# Patient Record
Sex: Female | Born: 1940 | Race: White | Hispanic: No | Marital: Married | Smoking: Never smoker
Health system: Southern US, Community
[De-identification: ages and names within clinical notes are randomized; demographics above are authoritative.]

## PROBLEM LIST (undated history)

## (undated) DIAGNOSIS — I639 Cerebral infarction, unspecified: Secondary | ICD-10-CM

## (undated) DIAGNOSIS — I10 Essential (primary) hypertension: Secondary | ICD-10-CM

## (undated) DIAGNOSIS — Z9889 Other specified postprocedural states: Secondary | ICD-10-CM

## (undated) DIAGNOSIS — F0390 Unspecified dementia without behavioral disturbance: Secondary | ICD-10-CM

---

## 2016-11-13 DIAGNOSIS — D518 Other vitamin B12 deficiency anemias: Secondary | ICD-10-CM | POA: Diagnosis not present

## 2016-11-13 DIAGNOSIS — E038 Other specified hypothyroidism: Secondary | ICD-10-CM | POA: Diagnosis not present

## 2016-11-13 DIAGNOSIS — E559 Vitamin D deficiency, unspecified: Secondary | ICD-10-CM | POA: Diagnosis not present

## 2016-11-13 DIAGNOSIS — I10 Essential (primary) hypertension: Secondary | ICD-10-CM | POA: Diagnosis not present

## 2016-11-13 DIAGNOSIS — E119 Type 2 diabetes mellitus without complications: Secondary | ICD-10-CM | POA: Diagnosis not present

## 2016-11-13 DIAGNOSIS — Z79899 Other long term (current) drug therapy: Secondary | ICD-10-CM | POA: Diagnosis not present

## 2016-11-13 DIAGNOSIS — E782 Mixed hyperlipidemia: Secondary | ICD-10-CM | POA: Diagnosis not present

## 2017-01-18 DIAGNOSIS — S199XXA Unspecified injury of neck, initial encounter: Secondary | ICD-10-CM | POA: Diagnosis not present

## 2017-01-18 DIAGNOSIS — W07XXXA Fall from chair, initial encounter: Secondary | ICD-10-CM | POA: Diagnosis not present

## 2017-01-18 DIAGNOSIS — S0990XA Unspecified injury of head, initial encounter: Secondary | ICD-10-CM | POA: Diagnosis not present

## 2017-01-18 DIAGNOSIS — S0121XA Laceration without foreign body of nose, initial encounter: Secondary | ICD-10-CM | POA: Diagnosis not present

## 2017-01-18 DIAGNOSIS — S022XXA Fracture of nasal bones, initial encounter for closed fracture: Secondary | ICD-10-CM | POA: Diagnosis not present

## 2017-01-18 DIAGNOSIS — M542 Cervicalgia: Secondary | ICD-10-CM | POA: Diagnosis not present

## 2017-01-20 DIAGNOSIS — K210 Gastro-esophageal reflux disease with esophagitis: Secondary | ICD-10-CM | POA: Diagnosis not present

## 2017-01-20 DIAGNOSIS — R221 Localized swelling, mass and lump, neck: Secondary | ICD-10-CM | POA: Diagnosis not present

## 2017-01-20 DIAGNOSIS — M150 Primary generalized (osteo)arthritis: Secondary | ICD-10-CM | POA: Diagnosis not present

## 2017-01-20 DIAGNOSIS — I10 Essential (primary) hypertension: Secondary | ICD-10-CM | POA: Diagnosis not present

## 2017-01-20 DIAGNOSIS — Z1211 Encounter for screening for malignant neoplasm of colon: Secondary | ICD-10-CM | POA: Diagnosis not present

## 2017-01-20 DIAGNOSIS — E038 Other specified hypothyroidism: Secondary | ICD-10-CM | POA: Diagnosis not present

## 2017-01-21 DIAGNOSIS — M150 Primary generalized (osteo)arthritis: Secondary | ICD-10-CM | POA: Diagnosis not present

## 2017-01-21 DIAGNOSIS — E038 Other specified hypothyroidism: Secondary | ICD-10-CM | POA: Diagnosis not present

## 2017-01-21 DIAGNOSIS — E782 Mixed hyperlipidemia: Secondary | ICD-10-CM | POA: Diagnosis not present

## 2017-01-21 DIAGNOSIS — I10 Essential (primary) hypertension: Secondary | ICD-10-CM | POA: Diagnosis not present

## 2017-02-08 DIAGNOSIS — M150 Primary generalized (osteo)arthritis: Secondary | ICD-10-CM | POA: Diagnosis not present

## 2017-02-08 DIAGNOSIS — E038 Other specified hypothyroidism: Secondary | ICD-10-CM | POA: Diagnosis not present

## 2017-02-08 DIAGNOSIS — S0990XA Unspecified injury of head, initial encounter: Secondary | ICD-10-CM | POA: Diagnosis not present

## 2017-02-08 DIAGNOSIS — X58XXXA Exposure to other specified factors, initial encounter: Secondary | ICD-10-CM | POA: Diagnosis not present

## 2017-02-08 DIAGNOSIS — R0989 Other specified symptoms and signs involving the circulatory and respiratory systems: Secondary | ICD-10-CM | POA: Diagnosis not present

## 2017-02-08 DIAGNOSIS — R51 Headache: Secondary | ICD-10-CM | POA: Diagnosis not present

## 2017-02-08 DIAGNOSIS — K210 Gastro-esophageal reflux disease with esophagitis: Secondary | ICD-10-CM | POA: Diagnosis not present

## 2017-02-08 DIAGNOSIS — E782 Mixed hyperlipidemia: Secondary | ICD-10-CM | POA: Diagnosis not present

## 2017-02-08 DIAGNOSIS — I10 Essential (primary) hypertension: Secondary | ICD-10-CM | POA: Diagnosis not present

## 2017-02-11 DIAGNOSIS — E038 Other specified hypothyroidism: Secondary | ICD-10-CM | POA: Diagnosis not present

## 2017-02-11 DIAGNOSIS — E782 Mixed hyperlipidemia: Secondary | ICD-10-CM | POA: Diagnosis not present

## 2017-02-11 DIAGNOSIS — I10 Essential (primary) hypertension: Secondary | ICD-10-CM | POA: Diagnosis not present

## 2017-02-11 DIAGNOSIS — M150 Primary generalized (osteo)arthritis: Secondary | ICD-10-CM | POA: Diagnosis not present

## 2017-02-22 DIAGNOSIS — J302 Other seasonal allergic rhinitis: Secondary | ICD-10-CM | POA: Diagnosis not present

## 2017-02-22 DIAGNOSIS — E782 Mixed hyperlipidemia: Secondary | ICD-10-CM | POA: Diagnosis not present

## 2017-02-22 DIAGNOSIS — I10 Essential (primary) hypertension: Secondary | ICD-10-CM | POA: Diagnosis not present

## 2017-02-22 DIAGNOSIS — E038 Other specified hypothyroidism: Secondary | ICD-10-CM | POA: Diagnosis not present

## 2017-02-24 DIAGNOSIS — E039 Hypothyroidism, unspecified: Secondary | ICD-10-CM | POA: Diagnosis not present

## 2017-02-24 DIAGNOSIS — K922 Gastrointestinal hemorrhage, unspecified: Secondary | ICD-10-CM | POA: Diagnosis not present

## 2017-02-24 DIAGNOSIS — I6523 Occlusion and stenosis of bilateral carotid arteries: Secondary | ICD-10-CM | POA: Diagnosis not present

## 2017-02-24 DIAGNOSIS — R531 Weakness: Secondary | ICD-10-CM | POA: Diagnosis not present

## 2017-02-24 DIAGNOSIS — R42 Dizziness and giddiness: Secondary | ICD-10-CM | POA: Diagnosis not present

## 2017-02-24 DIAGNOSIS — R55 Syncope and collapse: Secondary | ICD-10-CM | POA: Diagnosis not present

## 2017-02-24 DIAGNOSIS — I639 Cerebral infarction, unspecified: Secondary | ICD-10-CM | POA: Diagnosis not present

## 2017-02-24 DIAGNOSIS — S0990XA Unspecified injury of head, initial encounter: Secondary | ICD-10-CM | POA: Diagnosis not present

## 2017-02-24 DIAGNOSIS — I10 Essential (primary) hypertension: Secondary | ICD-10-CM | POA: Diagnosis not present

## 2017-02-25 DIAGNOSIS — M1990 Unspecified osteoarthritis, unspecified site: Secondary | ICD-10-CM | POA: Diagnosis not present

## 2017-02-25 DIAGNOSIS — Z880 Allergy status to penicillin: Secondary | ICD-10-CM | POA: Diagnosis not present

## 2017-02-25 DIAGNOSIS — R69 Illness, unspecified: Secondary | ICD-10-CM | POA: Diagnosis not present

## 2017-02-25 DIAGNOSIS — I10 Essential (primary) hypertension: Secondary | ICD-10-CM | POA: Diagnosis not present

## 2017-02-25 DIAGNOSIS — K922 Gastrointestinal hemorrhage, unspecified: Secondary | ICD-10-CM | POA: Diagnosis not present

## 2017-02-25 DIAGNOSIS — Z8711 Personal history of peptic ulcer disease: Secondary | ICD-10-CM | POA: Diagnosis not present

## 2017-02-25 DIAGNOSIS — R531 Weakness: Secondary | ICD-10-CM | POA: Diagnosis not present

## 2017-02-25 DIAGNOSIS — R55 Syncope and collapse: Secondary | ICD-10-CM | POA: Diagnosis not present

## 2017-02-25 DIAGNOSIS — I361 Nonrheumatic tricuspid (valve) insufficiency: Secondary | ICD-10-CM | POA: Diagnosis not present

## 2017-02-25 DIAGNOSIS — R413 Other amnesia: Secondary | ICD-10-CM | POA: Diagnosis not present

## 2017-02-25 DIAGNOSIS — E039 Hypothyroidism, unspecified: Secondary | ICD-10-CM | POA: Diagnosis not present

## 2017-02-25 DIAGNOSIS — I6523 Occlusion and stenosis of bilateral carotid arteries: Secondary | ICD-10-CM | POA: Diagnosis not present

## 2017-02-25 DIAGNOSIS — K219 Gastro-esophageal reflux disease without esophagitis: Secondary | ICD-10-CM | POA: Diagnosis not present

## 2017-02-25 DIAGNOSIS — R42 Dizziness and giddiness: Secondary | ICD-10-CM | POA: Diagnosis not present

## 2017-02-25 DIAGNOSIS — Z882 Allergy status to sulfonamides status: Secondary | ICD-10-CM | POA: Diagnosis not present

## 2017-02-25 DIAGNOSIS — I639 Cerebral infarction, unspecified: Secondary | ICD-10-CM | POA: Diagnosis not present

## 2017-03-01 DIAGNOSIS — R195 Other fecal abnormalities: Secondary | ICD-10-CM | POA: Diagnosis not present

## 2017-03-01 DIAGNOSIS — S0990XA Unspecified injury of head, initial encounter: Secondary | ICD-10-CM | POA: Diagnosis not present

## 2017-03-01 DIAGNOSIS — S199XXA Unspecified injury of neck, initial encounter: Secondary | ICD-10-CM | POA: Diagnosis not present

## 2017-03-01 DIAGNOSIS — R55 Syncope and collapse: Secondary | ICD-10-CM | POA: Diagnosis not present

## 2017-03-01 DIAGNOSIS — R110 Nausea: Secondary | ICD-10-CM | POA: Diagnosis not present

## 2017-03-01 DIAGNOSIS — I639 Cerebral infarction, unspecified: Secondary | ICD-10-CM | POA: Diagnosis not present

## 2017-03-01 DIAGNOSIS — R531 Weakness: Secondary | ICD-10-CM | POA: Diagnosis not present

## 2017-03-01 DIAGNOSIS — Z8673 Personal history of transient ischemic attack (TIA), and cerebral infarction without residual deficits: Secondary | ICD-10-CM | POA: Diagnosis not present

## 2017-03-01 DIAGNOSIS — E876 Hypokalemia: Secondary | ICD-10-CM | POA: Diagnosis not present

## 2017-03-01 DIAGNOSIS — E039 Hypothyroidism, unspecified: Secondary | ICD-10-CM | POA: Diagnosis not present

## 2017-03-01 DIAGNOSIS — R42 Dizziness and giddiness: Secondary | ICD-10-CM | POA: Diagnosis not present

## 2017-03-01 DIAGNOSIS — R69 Illness, unspecified: Secondary | ICD-10-CM | POA: Diagnosis not present

## 2017-03-01 DIAGNOSIS — I6523 Occlusion and stenosis of bilateral carotid arteries: Secondary | ICD-10-CM | POA: Diagnosis not present

## 2017-03-01 DIAGNOSIS — D649 Anemia, unspecified: Secondary | ICD-10-CM | POA: Diagnosis not present

## 2017-03-01 DIAGNOSIS — I499 Cardiac arrhythmia, unspecified: Secondary | ICD-10-CM | POA: Diagnosis not present

## 2017-03-01 DIAGNOSIS — I10 Essential (primary) hypertension: Secondary | ICD-10-CM | POA: Diagnosis not present

## 2017-03-02 DIAGNOSIS — Z7982 Long term (current) use of aspirin: Secondary | ICD-10-CM | POA: Diagnosis not present

## 2017-03-02 DIAGNOSIS — I63233 Cerebral infarction due to unspecified occlusion or stenosis of bilateral carotid arteries: Secondary | ICD-10-CM | POA: Diagnosis not present

## 2017-03-02 DIAGNOSIS — D649 Anemia, unspecified: Secondary | ICD-10-CM | POA: Diagnosis not present

## 2017-03-02 DIAGNOSIS — E039 Hypothyroidism, unspecified: Secondary | ICD-10-CM | POA: Diagnosis not present

## 2017-03-02 DIAGNOSIS — E876 Hypokalemia: Secondary | ICD-10-CM | POA: Diagnosis not present

## 2017-03-02 DIAGNOSIS — S199XXA Unspecified injury of neck, initial encounter: Secondary | ICD-10-CM | POA: Diagnosis not present

## 2017-03-02 DIAGNOSIS — J9811 Atelectasis: Secondary | ICD-10-CM | POA: Diagnosis not present

## 2017-03-02 DIAGNOSIS — I10 Essential (primary) hypertension: Secondary | ICD-10-CM | POA: Diagnosis not present

## 2017-03-02 DIAGNOSIS — R69 Illness, unspecified: Secondary | ICD-10-CM | POA: Diagnosis not present

## 2017-03-02 DIAGNOSIS — I639 Cerebral infarction, unspecified: Secondary | ICD-10-CM | POA: Diagnosis not present

## 2017-03-02 DIAGNOSIS — R55 Syncope and collapse: Secondary | ICD-10-CM | POA: Diagnosis not present

## 2017-03-02 DIAGNOSIS — S0990XA Unspecified injury of head, initial encounter: Secondary | ICD-10-CM | POA: Diagnosis not present

## 2017-03-03 DIAGNOSIS — I10 Essential (primary) hypertension: Secondary | ICD-10-CM | POA: Diagnosis not present

## 2017-03-03 DIAGNOSIS — I639 Cerebral infarction, unspecified: Secondary | ICD-10-CM | POA: Diagnosis not present

## 2017-03-03 DIAGNOSIS — I6529 Occlusion and stenosis of unspecified carotid artery: Secondary | ICD-10-CM | POA: Diagnosis not present

## 2017-03-03 DIAGNOSIS — R55 Syncope and collapse: Secondary | ICD-10-CM | POA: Diagnosis not present

## 2017-03-03 DIAGNOSIS — I119 Hypertensive heart disease without heart failure: Secondary | ICD-10-CM | POA: Diagnosis not present

## 2017-03-03 DIAGNOSIS — R69 Illness, unspecified: Secondary | ICD-10-CM | POA: Diagnosis not present

## 2017-03-03 DIAGNOSIS — D649 Anemia, unspecified: Secondary | ICD-10-CM | POA: Diagnosis not present

## 2017-03-03 DIAGNOSIS — E039 Hypothyroidism, unspecified: Secondary | ICD-10-CM | POA: Diagnosis not present

## 2017-03-09 DIAGNOSIS — R55 Syncope and collapse: Secondary | ICD-10-CM | POA: Diagnosis not present

## 2017-03-17 DIAGNOSIS — E038 Other specified hypothyroidism: Secondary | ICD-10-CM | POA: Diagnosis not present

## 2017-03-17 DIAGNOSIS — E782 Mixed hyperlipidemia: Secondary | ICD-10-CM | POA: Diagnosis not present

## 2017-03-17 DIAGNOSIS — I10 Essential (primary) hypertension: Secondary | ICD-10-CM | POA: Diagnosis not present

## 2017-03-17 DIAGNOSIS — M150 Primary generalized (osteo)arthritis: Secondary | ICD-10-CM | POA: Diagnosis not present

## 2017-03-24 DIAGNOSIS — K210 Gastro-esophageal reflux disease with esophagitis: Secondary | ICD-10-CM | POA: Diagnosis not present

## 2017-03-24 DIAGNOSIS — E782 Mixed hyperlipidemia: Secondary | ICD-10-CM | POA: Diagnosis not present

## 2017-03-24 DIAGNOSIS — I10 Essential (primary) hypertension: Secondary | ICD-10-CM | POA: Diagnosis not present

## 2017-03-24 DIAGNOSIS — E038 Other specified hypothyroidism: Secondary | ICD-10-CM | POA: Diagnosis not present

## 2017-03-24 DIAGNOSIS — M150 Primary generalized (osteo)arthritis: Secondary | ICD-10-CM | POA: Diagnosis not present

## 2017-03-24 DIAGNOSIS — D518 Other vitamin B12 deficiency anemias: Secondary | ICD-10-CM | POA: Diagnosis not present

## 2017-03-24 DIAGNOSIS — Z79899 Other long term (current) drug therapy: Secondary | ICD-10-CM | POA: Diagnosis not present

## 2017-04-05 DIAGNOSIS — I6523 Occlusion and stenosis of bilateral carotid arteries: Secondary | ICD-10-CM | POA: Diagnosis not present

## 2017-04-07 DIAGNOSIS — R55 Syncope and collapse: Secondary | ICD-10-CM | POA: Diagnosis not present

## 2017-04-09 DIAGNOSIS — Z8673 Personal history of transient ischemic attack (TIA), and cerebral infarction without residual deficits: Secondary | ICD-10-CM | POA: Diagnosis not present

## 2017-04-09 DIAGNOSIS — I6523 Occlusion and stenosis of bilateral carotid arteries: Secondary | ICD-10-CM | POA: Diagnosis not present

## 2017-04-19 DIAGNOSIS — M150 Primary generalized (osteo)arthritis: Secondary | ICD-10-CM | POA: Diagnosis not present

## 2017-04-19 DIAGNOSIS — E782 Mixed hyperlipidemia: Secondary | ICD-10-CM | POA: Diagnosis not present

## 2017-04-19 DIAGNOSIS — E038 Other specified hypothyroidism: Secondary | ICD-10-CM | POA: Diagnosis not present

## 2017-04-19 DIAGNOSIS — I10 Essential (primary) hypertension: Secondary | ICD-10-CM | POA: Diagnosis not present

## 2017-05-03 DIAGNOSIS — K210 Gastro-esophageal reflux disease with esophagitis: Secondary | ICD-10-CM | POA: Diagnosis not present

## 2017-05-03 DIAGNOSIS — E038 Other specified hypothyroidism: Secondary | ICD-10-CM | POA: Diagnosis not present

## 2017-05-03 DIAGNOSIS — I10 Essential (primary) hypertension: Secondary | ICD-10-CM | POA: Diagnosis not present

## 2017-05-03 DIAGNOSIS — E782 Mixed hyperlipidemia: Secondary | ICD-10-CM | POA: Diagnosis not present

## 2017-05-13 DIAGNOSIS — R55 Syncope and collapse: Secondary | ICD-10-CM | POA: Diagnosis not present

## 2017-06-03 DIAGNOSIS — I10 Essential (primary) hypertension: Secondary | ICD-10-CM | POA: Diagnosis not present

## 2017-06-03 DIAGNOSIS — E782 Mixed hyperlipidemia: Secondary | ICD-10-CM | POA: Diagnosis not present

## 2017-06-03 DIAGNOSIS — R69 Illness, unspecified: Secondary | ICD-10-CM | POA: Diagnosis not present

## 2017-06-03 DIAGNOSIS — E038 Other specified hypothyroidism: Secondary | ICD-10-CM | POA: Diagnosis not present

## 2017-06-17 DIAGNOSIS — M150 Primary generalized (osteo)arthritis: Secondary | ICD-10-CM | POA: Diagnosis not present

## 2017-06-17 DIAGNOSIS — I10 Essential (primary) hypertension: Secondary | ICD-10-CM | POA: Diagnosis not present

## 2017-06-17 DIAGNOSIS — E782 Mixed hyperlipidemia: Secondary | ICD-10-CM | POA: Diagnosis not present

## 2017-06-17 DIAGNOSIS — E038 Other specified hypothyroidism: Secondary | ICD-10-CM | POA: Diagnosis not present

## 2017-07-09 DIAGNOSIS — Z7901 Long term (current) use of anticoagulants: Secondary | ICD-10-CM | POA: Diagnosis not present

## 2017-07-09 DIAGNOSIS — R4182 Altered mental status, unspecified: Secondary | ICD-10-CM | POA: Diagnosis not present

## 2017-07-09 DIAGNOSIS — R69 Illness, unspecified: Secondary | ICD-10-CM | POA: Diagnosis not present

## 2017-07-09 DIAGNOSIS — R001 Bradycardia, unspecified: Secondary | ICD-10-CM | POA: Diagnosis not present

## 2017-07-09 DIAGNOSIS — Z7902 Long term (current) use of antithrombotics/antiplatelets: Secondary | ICD-10-CM | POA: Diagnosis not present

## 2017-07-09 DIAGNOSIS — R200 Anesthesia of skin: Secondary | ICD-10-CM | POA: Diagnosis not present

## 2017-07-09 DIAGNOSIS — Z8673 Personal history of transient ischemic attack (TIA), and cerebral infarction without residual deficits: Secondary | ICD-10-CM | POA: Diagnosis not present

## 2017-07-09 DIAGNOSIS — E785 Hyperlipidemia, unspecified: Secondary | ICD-10-CM | POA: Diagnosis not present

## 2017-07-09 DIAGNOSIS — R413 Other amnesia: Secondary | ICD-10-CM | POA: Diagnosis not present

## 2017-07-09 DIAGNOSIS — R202 Paresthesia of skin: Secondary | ICD-10-CM | POA: Diagnosis not present

## 2017-07-09 DIAGNOSIS — J9811 Atelectasis: Secondary | ICD-10-CM | POA: Diagnosis not present

## 2017-07-09 DIAGNOSIS — R4020 Unspecified coma: Secondary | ICD-10-CM | POA: Diagnosis not present

## 2017-07-09 DIAGNOSIS — R410 Disorientation, unspecified: Secondary | ICD-10-CM | POA: Diagnosis not present

## 2017-07-09 DIAGNOSIS — I10 Essential (primary) hypertension: Secondary | ICD-10-CM | POA: Diagnosis not present

## 2017-07-09 DIAGNOSIS — E871 Hypo-osmolality and hyponatremia: Secondary | ICD-10-CM | POA: Diagnosis not present

## 2017-07-09 DIAGNOSIS — E039 Hypothyroidism, unspecified: Secondary | ICD-10-CM | POA: Diagnosis not present

## 2017-07-09 DIAGNOSIS — H9190 Unspecified hearing loss, unspecified ear: Secondary | ICD-10-CM | POA: Diagnosis not present

## 2017-07-10 DIAGNOSIS — I10 Essential (primary) hypertension: Secondary | ICD-10-CM | POA: Diagnosis not present

## 2017-07-10 DIAGNOSIS — M4802 Spinal stenosis, cervical region: Secondary | ICD-10-CM | POA: Diagnosis not present

## 2017-07-10 DIAGNOSIS — R200 Anesthesia of skin: Secondary | ICD-10-CM | POA: Diagnosis not present

## 2017-07-10 DIAGNOSIS — R001 Bradycardia, unspecified: Secondary | ICD-10-CM | POA: Diagnosis not present

## 2017-07-10 DIAGNOSIS — R410 Disorientation, unspecified: Secondary | ICD-10-CM | POA: Diagnosis not present

## 2017-07-10 DIAGNOSIS — R9431 Abnormal electrocardiogram [ECG] [EKG]: Secondary | ICD-10-CM | POA: Diagnosis not present

## 2017-07-10 DIAGNOSIS — Z8673 Personal history of transient ischemic attack (TIA), and cerebral infarction without residual deficits: Secondary | ICD-10-CM | POA: Diagnosis not present

## 2017-07-10 DIAGNOSIS — E039 Hypothyroidism, unspecified: Secondary | ICD-10-CM | POA: Diagnosis not present

## 2017-07-10 DIAGNOSIS — E871 Hypo-osmolality and hyponatremia: Secondary | ICD-10-CM | POA: Diagnosis not present

## 2017-07-10 DIAGNOSIS — E785 Hyperlipidemia, unspecified: Secondary | ICD-10-CM | POA: Diagnosis not present

## 2017-07-11 DIAGNOSIS — E871 Hypo-osmolality and hyponatremia: Secondary | ICD-10-CM | POA: Diagnosis not present

## 2017-07-11 DIAGNOSIS — R001 Bradycardia, unspecified: Secondary | ICD-10-CM | POA: Diagnosis not present

## 2017-07-11 DIAGNOSIS — Z8673 Personal history of transient ischemic attack (TIA), and cerebral infarction without residual deficits: Secondary | ICD-10-CM | POA: Diagnosis not present

## 2017-07-11 DIAGNOSIS — E785 Hyperlipidemia, unspecified: Secondary | ICD-10-CM | POA: Diagnosis not present

## 2017-07-11 DIAGNOSIS — R410 Disorientation, unspecified: Secondary | ICD-10-CM | POA: Diagnosis not present

## 2017-07-11 DIAGNOSIS — I10 Essential (primary) hypertension: Secondary | ICD-10-CM | POA: Diagnosis not present

## 2017-07-11 DIAGNOSIS — E039 Hypothyroidism, unspecified: Secondary | ICD-10-CM | POA: Diagnosis not present

## 2017-07-11 DIAGNOSIS — R200 Anesthesia of skin: Secondary | ICD-10-CM | POA: Diagnosis not present

## 2017-07-12 DIAGNOSIS — Z8673 Personal history of transient ischemic attack (TIA), and cerebral infarction without residual deficits: Secondary | ICD-10-CM | POA: Diagnosis not present

## 2017-07-12 DIAGNOSIS — E871 Hypo-osmolality and hyponatremia: Secondary | ICD-10-CM | POA: Diagnosis not present

## 2017-07-12 DIAGNOSIS — I358 Other nonrheumatic aortic valve disorders: Secondary | ICD-10-CM | POA: Diagnosis not present

## 2017-07-12 DIAGNOSIS — R001 Bradycardia, unspecified: Secondary | ICD-10-CM | POA: Diagnosis not present

## 2017-07-12 DIAGNOSIS — R410 Disorientation, unspecified: Secondary | ICD-10-CM | POA: Diagnosis not present

## 2017-07-12 DIAGNOSIS — E785 Hyperlipidemia, unspecified: Secondary | ICD-10-CM | POA: Diagnosis not present

## 2017-07-12 DIAGNOSIS — I10 Essential (primary) hypertension: Secondary | ICD-10-CM | POA: Diagnosis not present

## 2017-07-12 DIAGNOSIS — E039 Hypothyroidism, unspecified: Secondary | ICD-10-CM | POA: Diagnosis not present

## 2017-07-12 DIAGNOSIS — R200 Anesthesia of skin: Secondary | ICD-10-CM | POA: Diagnosis not present

## 2017-07-13 DIAGNOSIS — R200 Anesthesia of skin: Secondary | ICD-10-CM | POA: Diagnosis not present

## 2017-07-13 DIAGNOSIS — E039 Hypothyroidism, unspecified: Secondary | ICD-10-CM | POA: Diagnosis not present

## 2017-07-13 DIAGNOSIS — I10 Essential (primary) hypertension: Secondary | ICD-10-CM | POA: Diagnosis not present

## 2017-07-13 DIAGNOSIS — E871 Hypo-osmolality and hyponatremia: Secondary | ICD-10-CM | POA: Diagnosis not present

## 2017-07-13 DIAGNOSIS — I4891 Unspecified atrial fibrillation: Secondary | ICD-10-CM | POA: Diagnosis not present

## 2017-07-13 DIAGNOSIS — R410 Disorientation, unspecified: Secondary | ICD-10-CM | POA: Diagnosis not present

## 2017-07-13 DIAGNOSIS — R69 Illness, unspecified: Secondary | ICD-10-CM | POA: Diagnosis not present

## 2017-07-13 DIAGNOSIS — R002 Palpitations: Secondary | ICD-10-CM | POA: Diagnosis not present

## 2017-07-13 DIAGNOSIS — E785 Hyperlipidemia, unspecified: Secondary | ICD-10-CM | POA: Diagnosis not present

## 2017-07-13 DIAGNOSIS — R001 Bradycardia, unspecified: Secondary | ICD-10-CM | POA: Diagnosis not present

## 2017-07-14 DIAGNOSIS — E785 Hyperlipidemia, unspecified: Secondary | ICD-10-CM | POA: Diagnosis not present

## 2017-07-14 DIAGNOSIS — I498 Other specified cardiac arrhythmias: Secondary | ICD-10-CM | POA: Diagnosis not present

## 2017-07-14 DIAGNOSIS — I10 Essential (primary) hypertension: Secondary | ICD-10-CM | POA: Diagnosis not present

## 2017-07-14 DIAGNOSIS — Z8673 Personal history of transient ischemic attack (TIA), and cerebral infarction without residual deficits: Secondary | ICD-10-CM | POA: Diagnosis not present

## 2017-07-14 DIAGNOSIS — R410 Disorientation, unspecified: Secondary | ICD-10-CM | POA: Diagnosis not present

## 2017-07-14 DIAGNOSIS — E871 Hypo-osmolality and hyponatremia: Secondary | ICD-10-CM | POA: Diagnosis not present

## 2017-07-14 DIAGNOSIS — R200 Anesthesia of skin: Secondary | ICD-10-CM | POA: Diagnosis not present

## 2017-07-14 DIAGNOSIS — R69 Illness, unspecified: Secondary | ICD-10-CM | POA: Diagnosis not present

## 2017-07-14 DIAGNOSIS — R002 Palpitations: Secondary | ICD-10-CM | POA: Diagnosis not present

## 2017-07-14 DIAGNOSIS — R001 Bradycardia, unspecified: Secondary | ICD-10-CM | POA: Diagnosis not present

## 2017-07-19 DIAGNOSIS — I10 Essential (primary) hypertension: Secondary | ICD-10-CM | POA: Diagnosis not present

## 2017-07-19 DIAGNOSIS — D519 Vitamin B12 deficiency anemia, unspecified: Secondary | ICD-10-CM | POA: Diagnosis not present

## 2017-07-19 DIAGNOSIS — D518 Other vitamin B12 deficiency anemias: Secondary | ICD-10-CM | POA: Diagnosis not present

## 2017-07-19 DIAGNOSIS — K210 Gastro-esophageal reflux disease with esophagitis: Secondary | ICD-10-CM | POA: Diagnosis not present

## 2017-07-19 DIAGNOSIS — E038 Other specified hypothyroidism: Secondary | ICD-10-CM | POA: Diagnosis not present

## 2017-07-19 DIAGNOSIS — E782 Mixed hyperlipidemia: Secondary | ICD-10-CM | POA: Diagnosis not present

## 2017-07-19 DIAGNOSIS — E559 Vitamin D deficiency, unspecified: Secondary | ICD-10-CM | POA: Diagnosis not present

## 2017-07-19 DIAGNOSIS — E119 Type 2 diabetes mellitus without complications: Secondary | ICD-10-CM | POA: Diagnosis not present

## 2017-07-19 DIAGNOSIS — Z79899 Other long term (current) drug therapy: Secondary | ICD-10-CM | POA: Diagnosis not present

## 2017-07-20 DIAGNOSIS — E038 Other specified hypothyroidism: Secondary | ICD-10-CM | POA: Diagnosis not present

## 2017-07-20 DIAGNOSIS — M150 Primary generalized (osteo)arthritis: Secondary | ICD-10-CM | POA: Diagnosis not present

## 2017-07-20 DIAGNOSIS — E782 Mixed hyperlipidemia: Secondary | ICD-10-CM | POA: Diagnosis not present

## 2017-07-20 DIAGNOSIS — I10 Essential (primary) hypertension: Secondary | ICD-10-CM | POA: Diagnosis not present

## 2017-07-26 DIAGNOSIS — Z4509 Encounter for adjustment and management of other cardiac device: Secondary | ICD-10-CM | POA: Diagnosis not present

## 2017-07-26 DIAGNOSIS — R55 Syncope and collapse: Secondary | ICD-10-CM | POA: Diagnosis not present

## 2017-08-10 DIAGNOSIS — Z7901 Long term (current) use of anticoagulants: Secondary | ICD-10-CM | POA: Diagnosis not present

## 2017-08-10 DIAGNOSIS — I6529 Occlusion and stenosis of unspecified carotid artery: Secondary | ICD-10-CM | POA: Diagnosis not present

## 2017-08-10 DIAGNOSIS — I6389 Other cerebral infarction: Secondary | ICD-10-CM | POA: Diagnosis not present

## 2017-08-10 DIAGNOSIS — R413 Other amnesia: Secondary | ICD-10-CM | POA: Diagnosis not present

## 2017-08-10 DIAGNOSIS — E039 Hypothyroidism, unspecified: Secondary | ICD-10-CM | POA: Diagnosis not present

## 2017-08-10 DIAGNOSIS — Z79890 Hormone replacement therapy (postmenopausal): Secondary | ICD-10-CM | POA: Diagnosis not present

## 2017-08-10 DIAGNOSIS — I6509 Occlusion and stenosis of unspecified vertebral artery: Secondary | ICD-10-CM | POA: Diagnosis not present

## 2017-08-10 DIAGNOSIS — I10 Essential (primary) hypertension: Secondary | ICD-10-CM | POA: Diagnosis not present

## 2017-08-10 DIAGNOSIS — R55 Syncope and collapse: Secondary | ICD-10-CM | POA: Diagnosis not present

## 2017-08-11 DIAGNOSIS — R319 Hematuria, unspecified: Secondary | ICD-10-CM | POA: Diagnosis not present

## 2017-08-11 DIAGNOSIS — I10 Essential (primary) hypertension: Secondary | ICD-10-CM | POA: Diagnosis not present

## 2017-08-11 DIAGNOSIS — N22 Calculus of urinary tract in diseases classified elsewhere: Secondary | ICD-10-CM | POA: Diagnosis not present

## 2017-08-11 DIAGNOSIS — H6122 Impacted cerumen, left ear: Secondary | ICD-10-CM | POA: Diagnosis not present

## 2017-08-11 DIAGNOSIS — N399 Disorder of urinary system, unspecified: Secondary | ICD-10-CM | POA: Diagnosis not present

## 2017-08-17 DIAGNOSIS — E782 Mixed hyperlipidemia: Secondary | ICD-10-CM | POA: Diagnosis not present

## 2017-08-17 DIAGNOSIS — E038 Other specified hypothyroidism: Secondary | ICD-10-CM | POA: Diagnosis not present

## 2017-08-17 DIAGNOSIS — M150 Primary generalized (osteo)arthritis: Secondary | ICD-10-CM | POA: Diagnosis not present

## 2017-08-17 DIAGNOSIS — I10 Essential (primary) hypertension: Secondary | ICD-10-CM | POA: Diagnosis not present

## 2017-08-30 DIAGNOSIS — E038 Other specified hypothyroidism: Secondary | ICD-10-CM | POA: Diagnosis not present

## 2017-08-30 DIAGNOSIS — E782 Mixed hyperlipidemia: Secondary | ICD-10-CM | POA: Diagnosis not present

## 2017-08-30 DIAGNOSIS — I10 Essential (primary) hypertension: Secondary | ICD-10-CM | POA: Diagnosis not present

## 2017-08-30 DIAGNOSIS — K210 Gastro-esophageal reflux disease with esophagitis: Secondary | ICD-10-CM | POA: Diagnosis not present

## 2017-09-13 DIAGNOSIS — E782 Mixed hyperlipidemia: Secondary | ICD-10-CM | POA: Diagnosis not present

## 2017-09-13 DIAGNOSIS — I10 Essential (primary) hypertension: Secondary | ICD-10-CM | POA: Diagnosis not present

## 2017-09-13 DIAGNOSIS — E038 Other specified hypothyroidism: Secondary | ICD-10-CM | POA: Diagnosis not present

## 2017-09-13 DIAGNOSIS — K210 Gastro-esophageal reflux disease with esophagitis: Secondary | ICD-10-CM | POA: Diagnosis not present

## 2017-09-27 DIAGNOSIS — I10 Essential (primary) hypertension: Secondary | ICD-10-CM | POA: Diagnosis not present

## 2017-09-27 DIAGNOSIS — E038 Other specified hypothyroidism: Secondary | ICD-10-CM | POA: Diagnosis not present

## 2017-10-25 DIAGNOSIS — I639 Cerebral infarction, unspecified: Secondary | ICD-10-CM | POA: Diagnosis not present

## 2017-11-08 DIAGNOSIS — E782 Mixed hyperlipidemia: Secondary | ICD-10-CM | POA: Diagnosis not present

## 2017-11-08 DIAGNOSIS — K210 Gastro-esophageal reflux disease with esophagitis: Secondary | ICD-10-CM | POA: Diagnosis not present

## 2017-11-08 DIAGNOSIS — I10 Essential (primary) hypertension: Secondary | ICD-10-CM | POA: Diagnosis not present

## 2017-11-08 DIAGNOSIS — E119 Type 2 diabetes mellitus without complications: Secondary | ICD-10-CM | POA: Diagnosis not present

## 2017-11-08 DIAGNOSIS — R350 Frequency of micturition: Secondary | ICD-10-CM | POA: Diagnosis not present

## 2017-11-08 DIAGNOSIS — E559 Vitamin D deficiency, unspecified: Secondary | ICD-10-CM | POA: Diagnosis not present

## 2017-11-08 DIAGNOSIS — Z79899 Other long term (current) drug therapy: Secondary | ICD-10-CM | POA: Diagnosis not present

## 2017-11-08 DIAGNOSIS — E038 Other specified hypothyroidism: Secondary | ICD-10-CM | POA: Diagnosis not present

## 2017-11-08 DIAGNOSIS — D518 Other vitamin B12 deficiency anemias: Secondary | ICD-10-CM | POA: Diagnosis not present

## 2017-11-17 DIAGNOSIS — I10 Essential (primary) hypertension: Secondary | ICD-10-CM | POA: Diagnosis not present

## 2017-11-17 DIAGNOSIS — Z95818 Presence of other cardiac implants and grafts: Secondary | ICD-10-CM | POA: Diagnosis not present

## 2017-11-17 DIAGNOSIS — I63239 Cerebral infarction due to unspecified occlusion or stenosis of unspecified carotid arteries: Secondary | ICD-10-CM | POA: Diagnosis not present

## 2017-11-17 DIAGNOSIS — R55 Syncope and collapse: Secondary | ICD-10-CM | POA: Diagnosis not present

## 2017-11-24 DIAGNOSIS — M150 Primary generalized (osteo)arthritis: Secondary | ICD-10-CM | POA: Diagnosis not present

## 2017-11-24 DIAGNOSIS — E782 Mixed hyperlipidemia: Secondary | ICD-10-CM | POA: Diagnosis not present

## 2017-11-24 DIAGNOSIS — E038 Other specified hypothyroidism: Secondary | ICD-10-CM | POA: Diagnosis not present

## 2017-11-24 DIAGNOSIS — I10 Essential (primary) hypertension: Secondary | ICD-10-CM | POA: Diagnosis not present

## 2017-12-06 DIAGNOSIS — E782 Mixed hyperlipidemia: Secondary | ICD-10-CM | POA: Diagnosis not present

## 2017-12-06 DIAGNOSIS — K210 Gastro-esophageal reflux disease with esophagitis: Secondary | ICD-10-CM | POA: Diagnosis not present

## 2017-12-06 DIAGNOSIS — D518 Other vitamin B12 deficiency anemias: Secondary | ICD-10-CM | POA: Diagnosis not present

## 2017-12-06 DIAGNOSIS — I10 Essential (primary) hypertension: Secondary | ICD-10-CM | POA: Diagnosis not present

## 2017-12-06 DIAGNOSIS — E038 Other specified hypothyroidism: Secondary | ICD-10-CM | POA: Diagnosis not present

## 2017-12-20 DIAGNOSIS — E782 Mixed hyperlipidemia: Secondary | ICD-10-CM | POA: Diagnosis not present

## 2017-12-20 DIAGNOSIS — E038 Other specified hypothyroidism: Secondary | ICD-10-CM | POA: Diagnosis not present

## 2017-12-20 DIAGNOSIS — M150 Primary generalized (osteo)arthritis: Secondary | ICD-10-CM | POA: Diagnosis not present

## 2017-12-20 DIAGNOSIS — I10 Essential (primary) hypertension: Secondary | ICD-10-CM | POA: Diagnosis not present

## 2017-12-27 DIAGNOSIS — I10 Essential (primary) hypertension: Secondary | ICD-10-CM | POA: Diagnosis not present

## 2017-12-28 DIAGNOSIS — E785 Hyperlipidemia, unspecified: Secondary | ICD-10-CM | POA: Diagnosis not present

## 2017-12-28 DIAGNOSIS — I10 Essential (primary) hypertension: Secondary | ICD-10-CM | POA: Diagnosis not present

## 2017-12-28 DIAGNOSIS — Z95818 Presence of other cardiac implants and grafts: Secondary | ICD-10-CM | POA: Diagnosis not present

## 2017-12-28 DIAGNOSIS — R69 Illness, unspecified: Secondary | ICD-10-CM | POA: Diagnosis not present

## 2017-12-28 DIAGNOSIS — I639 Cerebral infarction, unspecified: Secondary | ICD-10-CM | POA: Diagnosis not present

## 2018-01-12 DIAGNOSIS — N3090 Cystitis, unspecified without hematuria: Secondary | ICD-10-CM | POA: Diagnosis not present

## 2018-01-12 DIAGNOSIS — I10 Essential (primary) hypertension: Secondary | ICD-10-CM | POA: Diagnosis not present

## 2018-01-17 DIAGNOSIS — I10 Essential (primary) hypertension: Secondary | ICD-10-CM | POA: Diagnosis not present

## 2018-01-17 DIAGNOSIS — E782 Mixed hyperlipidemia: Secondary | ICD-10-CM | POA: Diagnosis not present

## 2018-01-17 DIAGNOSIS — E038 Other specified hypothyroidism: Secondary | ICD-10-CM | POA: Diagnosis not present

## 2018-01-17 DIAGNOSIS — K210 Gastro-esophageal reflux disease with esophagitis: Secondary | ICD-10-CM | POA: Diagnosis not present

## 2018-01-17 DIAGNOSIS — D518 Other vitamin B12 deficiency anemias: Secondary | ICD-10-CM | POA: Diagnosis not present

## 2018-01-17 DIAGNOSIS — Z0000 Encounter for general adult medical examination without abnormal findings: Secondary | ICD-10-CM | POA: Diagnosis not present

## 2018-01-19 DIAGNOSIS — M150 Primary generalized (osteo)arthritis: Secondary | ICD-10-CM | POA: Diagnosis not present

## 2018-01-19 DIAGNOSIS — E782 Mixed hyperlipidemia: Secondary | ICD-10-CM | POA: Diagnosis not present

## 2018-01-19 DIAGNOSIS — E038 Other specified hypothyroidism: Secondary | ICD-10-CM | POA: Diagnosis not present

## 2018-01-19 DIAGNOSIS — I10 Essential (primary) hypertension: Secondary | ICD-10-CM | POA: Diagnosis not present

## 2018-01-24 DIAGNOSIS — I639 Cerebral infarction, unspecified: Secondary | ICD-10-CM | POA: Diagnosis not present

## 2018-01-27 DIAGNOSIS — E782 Mixed hyperlipidemia: Secondary | ICD-10-CM | POA: Diagnosis not present

## 2018-01-27 DIAGNOSIS — E119 Type 2 diabetes mellitus without complications: Secondary | ICD-10-CM | POA: Diagnosis not present

## 2018-01-27 DIAGNOSIS — D518 Other vitamin B12 deficiency anemias: Secondary | ICD-10-CM | POA: Diagnosis not present

## 2018-01-27 DIAGNOSIS — D500 Iron deficiency anemia secondary to blood loss (chronic): Secondary | ICD-10-CM | POA: Diagnosis not present

## 2018-01-27 DIAGNOSIS — E038 Other specified hypothyroidism: Secondary | ICD-10-CM | POA: Diagnosis not present

## 2018-01-27 DIAGNOSIS — E559 Vitamin D deficiency, unspecified: Secondary | ICD-10-CM | POA: Diagnosis not present

## 2018-01-27 DIAGNOSIS — R300 Dysuria: Secondary | ICD-10-CM | POA: Diagnosis not present

## 2018-01-27 DIAGNOSIS — Z79899 Other long term (current) drug therapy: Secondary | ICD-10-CM | POA: Diagnosis not present

## 2018-02-14 DIAGNOSIS — E782 Mixed hyperlipidemia: Secondary | ICD-10-CM | POA: Diagnosis not present

## 2018-02-14 DIAGNOSIS — E038 Other specified hypothyroidism: Secondary | ICD-10-CM | POA: Diagnosis not present

## 2018-02-14 DIAGNOSIS — M150 Primary generalized (osteo)arthritis: Secondary | ICD-10-CM | POA: Diagnosis not present

## 2018-02-14 DIAGNOSIS — I10 Essential (primary) hypertension: Secondary | ICD-10-CM | POA: Diagnosis not present

## 2018-02-15 DIAGNOSIS — E538 Deficiency of other specified B group vitamins: Secondary | ICD-10-CM | POA: Diagnosis not present

## 2018-02-15 DIAGNOSIS — G309 Alzheimer's disease, unspecified: Secondary | ICD-10-CM | POA: Diagnosis not present

## 2018-02-15 DIAGNOSIS — I639 Cerebral infarction, unspecified: Secondary | ICD-10-CM | POA: Diagnosis not present

## 2018-02-15 DIAGNOSIS — M159 Polyosteoarthritis, unspecified: Secondary | ICD-10-CM | POA: Diagnosis not present

## 2018-02-15 DIAGNOSIS — E785 Hyperlipidemia, unspecified: Secondary | ICD-10-CM | POA: Diagnosis not present

## 2018-02-15 DIAGNOSIS — J309 Allergic rhinitis, unspecified: Secondary | ICD-10-CM | POA: Diagnosis not present

## 2018-02-15 DIAGNOSIS — E039 Hypothyroidism, unspecified: Secondary | ICD-10-CM | POA: Diagnosis not present

## 2018-02-15 DIAGNOSIS — R5382 Chronic fatigue, unspecified: Secondary | ICD-10-CM | POA: Diagnosis not present

## 2018-02-15 DIAGNOSIS — I10 Essential (primary) hypertension: Secondary | ICD-10-CM | POA: Diagnosis not present

## 2018-02-15 DIAGNOSIS — K219 Gastro-esophageal reflux disease without esophagitis: Secondary | ICD-10-CM | POA: Diagnosis not present

## 2018-02-16 DIAGNOSIS — Z8673 Personal history of transient ischemic attack (TIA), and cerebral infarction without residual deficits: Secondary | ICD-10-CM | POA: Diagnosis not present

## 2018-02-16 DIAGNOSIS — I10 Essential (primary) hypertension: Secondary | ICD-10-CM | POA: Diagnosis not present

## 2018-02-16 DIAGNOSIS — Z95818 Presence of other cardiac implants and grafts: Secondary | ICD-10-CM | POA: Diagnosis not present

## 2018-02-16 DIAGNOSIS — R55 Syncope and collapse: Secondary | ICD-10-CM | POA: Diagnosis not present

## 2018-02-16 DIAGNOSIS — I6523 Occlusion and stenosis of bilateral carotid arteries: Secondary | ICD-10-CM | POA: Diagnosis not present

## 2018-02-16 DIAGNOSIS — R69 Illness, unspecified: Secondary | ICD-10-CM | POA: Diagnosis not present

## 2018-02-16 DIAGNOSIS — I639 Cerebral infarction, unspecified: Secondary | ICD-10-CM | POA: Diagnosis not present

## 2018-02-18 DIAGNOSIS — E039 Hypothyroidism, unspecified: Secondary | ICD-10-CM | POA: Diagnosis not present

## 2018-02-18 DIAGNOSIS — I10 Essential (primary) hypertension: Secondary | ICD-10-CM | POA: Diagnosis not present

## 2018-02-18 DIAGNOSIS — E538 Deficiency of other specified B group vitamins: Secondary | ICD-10-CM | POA: Diagnosis not present

## 2018-02-18 DIAGNOSIS — M159 Polyosteoarthritis, unspecified: Secondary | ICD-10-CM | POA: Diagnosis not present

## 2018-02-18 DIAGNOSIS — J309 Allergic rhinitis, unspecified: Secondary | ICD-10-CM | POA: Diagnosis not present

## 2018-02-18 DIAGNOSIS — R69 Illness, unspecified: Secondary | ICD-10-CM | POA: Diagnosis not present

## 2018-02-18 DIAGNOSIS — I639 Cerebral infarction, unspecified: Secondary | ICD-10-CM | POA: Diagnosis not present

## 2018-02-18 DIAGNOSIS — E785 Hyperlipidemia, unspecified: Secondary | ICD-10-CM | POA: Diagnosis not present

## 2018-02-18 DIAGNOSIS — K219 Gastro-esophageal reflux disease without esophagitis: Secondary | ICD-10-CM | POA: Diagnosis not present

## 2018-02-18 DIAGNOSIS — G309 Alzheimer's disease, unspecified: Secondary | ICD-10-CM | POA: Diagnosis not present

## 2018-02-21 DIAGNOSIS — L570 Actinic keratosis: Secondary | ICD-10-CM | POA: Diagnosis not present

## 2018-02-21 DIAGNOSIS — L578 Other skin changes due to chronic exposure to nonionizing radiation: Secondary | ICD-10-CM | POA: Diagnosis not present

## 2018-02-22 DIAGNOSIS — E785 Hyperlipidemia, unspecified: Secondary | ICD-10-CM | POA: Diagnosis not present

## 2018-02-22 DIAGNOSIS — E538 Deficiency of other specified B group vitamins: Secondary | ICD-10-CM | POA: Diagnosis not present

## 2018-02-22 DIAGNOSIS — Z9181 History of falling: Secondary | ICD-10-CM | POA: Diagnosis not present

## 2018-02-22 DIAGNOSIS — I10 Essential (primary) hypertension: Secondary | ICD-10-CM | POA: Diagnosis not present

## 2018-02-22 DIAGNOSIS — G309 Alzheimer's disease, unspecified: Secondary | ICD-10-CM | POA: Diagnosis not present

## 2018-02-22 DIAGNOSIS — Z1331 Encounter for screening for depression: Secondary | ICD-10-CM | POA: Diagnosis not present

## 2018-02-22 DIAGNOSIS — R69 Illness, unspecified: Secondary | ICD-10-CM | POA: Diagnosis not present

## 2018-02-22 DIAGNOSIS — K219 Gastro-esophageal reflux disease without esophagitis: Secondary | ICD-10-CM | POA: Diagnosis not present

## 2018-02-22 DIAGNOSIS — J309 Allergic rhinitis, unspecified: Secondary | ICD-10-CM | POA: Diagnosis not present

## 2018-02-25 DIAGNOSIS — R69 Illness, unspecified: Secondary | ICD-10-CM | POA: Diagnosis not present

## 2018-02-25 DIAGNOSIS — E538 Deficiency of other specified B group vitamins: Secondary | ICD-10-CM | POA: Diagnosis not present

## 2018-02-25 DIAGNOSIS — K219 Gastro-esophageal reflux disease without esophagitis: Secondary | ICD-10-CM | POA: Diagnosis not present

## 2018-02-25 DIAGNOSIS — K921 Melena: Secondary | ICD-10-CM | POA: Diagnosis not present

## 2018-02-25 DIAGNOSIS — R300 Dysuria: Secondary | ICD-10-CM | POA: Diagnosis not present

## 2018-02-25 DIAGNOSIS — J309 Allergic rhinitis, unspecified: Secondary | ICD-10-CM | POA: Diagnosis not present

## 2018-02-25 DIAGNOSIS — E785 Hyperlipidemia, unspecified: Secondary | ICD-10-CM | POA: Diagnosis not present

## 2018-02-25 DIAGNOSIS — I10 Essential (primary) hypertension: Secondary | ICD-10-CM | POA: Diagnosis not present

## 2018-02-25 DIAGNOSIS — G309 Alzheimer's disease, unspecified: Secondary | ICD-10-CM | POA: Diagnosis not present

## 2018-03-01 DIAGNOSIS — M159 Polyosteoarthritis, unspecified: Secondary | ICD-10-CM | POA: Diagnosis not present

## 2018-03-01 DIAGNOSIS — I639 Cerebral infarction, unspecified: Secondary | ICD-10-CM | POA: Diagnosis not present

## 2018-03-01 DIAGNOSIS — E538 Deficiency of other specified B group vitamins: Secondary | ICD-10-CM | POA: Diagnosis not present

## 2018-03-01 DIAGNOSIS — R69 Illness, unspecified: Secondary | ICD-10-CM | POA: Diagnosis not present

## 2018-03-01 DIAGNOSIS — K219 Gastro-esophageal reflux disease without esophagitis: Secondary | ICD-10-CM | POA: Diagnosis not present

## 2018-03-01 DIAGNOSIS — J309 Allergic rhinitis, unspecified: Secondary | ICD-10-CM | POA: Diagnosis not present

## 2018-03-01 DIAGNOSIS — E039 Hypothyroidism, unspecified: Secondary | ICD-10-CM | POA: Diagnosis not present

## 2018-03-01 DIAGNOSIS — I10 Essential (primary) hypertension: Secondary | ICD-10-CM | POA: Diagnosis not present

## 2018-03-01 DIAGNOSIS — G309 Alzheimer's disease, unspecified: Secondary | ICD-10-CM | POA: Diagnosis not present

## 2018-03-01 DIAGNOSIS — E785 Hyperlipidemia, unspecified: Secondary | ICD-10-CM | POA: Diagnosis not present

## 2018-03-15 DIAGNOSIS — K219 Gastro-esophageal reflux disease without esophagitis: Secondary | ICD-10-CM | POA: Diagnosis not present

## 2018-03-15 DIAGNOSIS — E785 Hyperlipidemia, unspecified: Secondary | ICD-10-CM | POA: Diagnosis not present

## 2018-03-15 DIAGNOSIS — G309 Alzheimer's disease, unspecified: Secondary | ICD-10-CM | POA: Diagnosis not present

## 2018-03-15 DIAGNOSIS — R69 Illness, unspecified: Secondary | ICD-10-CM | POA: Diagnosis not present

## 2018-03-15 DIAGNOSIS — R300 Dysuria: Secondary | ICD-10-CM | POA: Diagnosis not present

## 2018-03-15 DIAGNOSIS — M159 Polyosteoarthritis, unspecified: Secondary | ICD-10-CM | POA: Diagnosis not present

## 2018-03-15 DIAGNOSIS — E039 Hypothyroidism, unspecified: Secondary | ICD-10-CM | POA: Diagnosis not present

## 2018-03-15 DIAGNOSIS — E538 Deficiency of other specified B group vitamins: Secondary | ICD-10-CM | POA: Diagnosis not present

## 2018-03-15 DIAGNOSIS — J309 Allergic rhinitis, unspecified: Secondary | ICD-10-CM | POA: Diagnosis not present

## 2018-03-15 DIAGNOSIS — I10 Essential (primary) hypertension: Secondary | ICD-10-CM | POA: Diagnosis not present

## 2018-03-23 DIAGNOSIS — T50905A Adverse effect of unspecified drugs, medicaments and biological substances, initial encounter: Secondary | ICD-10-CM | POA: Diagnosis not present

## 2018-03-23 DIAGNOSIS — Z8673 Personal history of transient ischemic attack (TIA), and cerebral infarction without residual deficits: Secondary | ICD-10-CM | POA: Diagnosis not present

## 2018-03-23 DIAGNOSIS — E039 Hypothyroidism, unspecified: Secondary | ICD-10-CM | POA: Diagnosis not present

## 2018-03-23 DIAGNOSIS — X58XXXA Exposure to other specified factors, initial encounter: Secondary | ICD-10-CM | POA: Diagnosis not present

## 2018-03-23 DIAGNOSIS — E785 Hyperlipidemia, unspecified: Secondary | ICD-10-CM | POA: Diagnosis not present

## 2018-03-23 DIAGNOSIS — K449 Diaphragmatic hernia without obstruction or gangrene: Secondary | ICD-10-CM | POA: Diagnosis not present

## 2018-03-23 DIAGNOSIS — I10 Essential (primary) hypertension: Secondary | ICD-10-CM | POA: Diagnosis not present

## 2018-03-23 DIAGNOSIS — R531 Weakness: Secondary | ICD-10-CM | POA: Diagnosis not present

## 2018-03-23 DIAGNOSIS — R69 Illness, unspecified: Secondary | ICD-10-CM | POA: Diagnosis not present

## 2018-03-25 DIAGNOSIS — I639 Cerebral infarction, unspecified: Secondary | ICD-10-CM | POA: Diagnosis not present

## 2018-03-25 DIAGNOSIS — E785 Hyperlipidemia, unspecified: Secondary | ICD-10-CM | POA: Diagnosis not present

## 2018-03-25 DIAGNOSIS — K219 Gastro-esophageal reflux disease without esophagitis: Secondary | ICD-10-CM | POA: Diagnosis not present

## 2018-03-25 DIAGNOSIS — M159 Polyosteoarthritis, unspecified: Secondary | ICD-10-CM | POA: Diagnosis not present

## 2018-03-25 DIAGNOSIS — R69 Illness, unspecified: Secondary | ICD-10-CM | POA: Diagnosis not present

## 2018-03-25 DIAGNOSIS — G309 Alzheimer's disease, unspecified: Secondary | ICD-10-CM | POA: Diagnosis not present

## 2018-03-25 DIAGNOSIS — I10 Essential (primary) hypertension: Secondary | ICD-10-CM | POA: Diagnosis not present

## 2018-03-25 DIAGNOSIS — E039 Hypothyroidism, unspecified: Secondary | ICD-10-CM | POA: Diagnosis not present

## 2018-03-25 DIAGNOSIS — E538 Deficiency of other specified B group vitamins: Secondary | ICD-10-CM | POA: Diagnosis not present

## 2018-04-13 DIAGNOSIS — H2513 Age-related nuclear cataract, bilateral: Secondary | ICD-10-CM | POA: Diagnosis not present

## 2018-04-22 DIAGNOSIS — G309 Alzheimer's disease, unspecified: Secondary | ICD-10-CM | POA: Diagnosis not present

## 2018-04-22 DIAGNOSIS — I639 Cerebral infarction, unspecified: Secondary | ICD-10-CM | POA: Diagnosis not present

## 2018-04-22 DIAGNOSIS — I10 Essential (primary) hypertension: Secondary | ICD-10-CM | POA: Diagnosis not present

## 2018-04-22 DIAGNOSIS — R69 Illness, unspecified: Secondary | ICD-10-CM | POA: Diagnosis not present

## 2018-04-22 DIAGNOSIS — E538 Deficiency of other specified B group vitamins: Secondary | ICD-10-CM | POA: Diagnosis not present

## 2018-04-22 DIAGNOSIS — J309 Allergic rhinitis, unspecified: Secondary | ICD-10-CM | POA: Diagnosis not present

## 2018-04-22 DIAGNOSIS — K219 Gastro-esophageal reflux disease without esophagitis: Secondary | ICD-10-CM | POA: Diagnosis not present

## 2018-04-22 DIAGNOSIS — E785 Hyperlipidemia, unspecified: Secondary | ICD-10-CM | POA: Diagnosis not present

## 2018-04-22 DIAGNOSIS — E039 Hypothyroidism, unspecified: Secondary | ICD-10-CM | POA: Diagnosis not present

## 2018-04-22 DIAGNOSIS — M159 Polyosteoarthritis, unspecified: Secondary | ICD-10-CM | POA: Diagnosis not present

## 2018-04-23 DIAGNOSIS — Z95818 Presence of other cardiac implants and grafts: Secondary | ICD-10-CM | POA: Diagnosis not present

## 2018-04-23 DIAGNOSIS — I639 Cerebral infarction, unspecified: Secondary | ICD-10-CM | POA: Diagnosis not present

## 2018-05-02 DIAGNOSIS — H5789 Other specified disorders of eye and adnexa: Secondary | ICD-10-CM | POA: Diagnosis not present

## 2018-05-02 DIAGNOSIS — H2511 Age-related nuclear cataract, right eye: Secondary | ICD-10-CM | POA: Diagnosis not present

## 2018-05-20 DIAGNOSIS — G309 Alzheimer's disease, unspecified: Secondary | ICD-10-CM | POA: Diagnosis not present

## 2018-05-20 DIAGNOSIS — E785 Hyperlipidemia, unspecified: Secondary | ICD-10-CM | POA: Diagnosis not present

## 2018-05-20 DIAGNOSIS — I639 Cerebral infarction, unspecified: Secondary | ICD-10-CM | POA: Diagnosis not present

## 2018-05-20 DIAGNOSIS — E538 Deficiency of other specified B group vitamins: Secondary | ICD-10-CM | POA: Diagnosis not present

## 2018-05-20 DIAGNOSIS — I10 Essential (primary) hypertension: Secondary | ICD-10-CM | POA: Diagnosis not present

## 2018-05-20 DIAGNOSIS — M159 Polyosteoarthritis, unspecified: Secondary | ICD-10-CM | POA: Diagnosis not present

## 2018-05-20 DIAGNOSIS — R69 Illness, unspecified: Secondary | ICD-10-CM | POA: Diagnosis not present

## 2018-05-20 DIAGNOSIS — J309 Allergic rhinitis, unspecified: Secondary | ICD-10-CM | POA: Diagnosis not present

## 2018-05-20 DIAGNOSIS — E539 Vitamin B deficiency, unspecified: Secondary | ICD-10-CM | POA: Diagnosis not present

## 2018-05-30 DIAGNOSIS — H2512 Age-related nuclear cataract, left eye: Secondary | ICD-10-CM | POA: Diagnosis not present

## 2018-05-30 DIAGNOSIS — H25812 Combined forms of age-related cataract, left eye: Secondary | ICD-10-CM | POA: Diagnosis not present

## 2018-05-31 DIAGNOSIS — H2512 Age-related nuclear cataract, left eye: Secondary | ICD-10-CM | POA: Diagnosis not present

## 2018-06-21 DIAGNOSIS — E785 Hyperlipidemia, unspecified: Secondary | ICD-10-CM | POA: Diagnosis not present

## 2018-06-21 DIAGNOSIS — I639 Cerebral infarction, unspecified: Secondary | ICD-10-CM | POA: Diagnosis not present

## 2018-06-21 DIAGNOSIS — M159 Polyosteoarthritis, unspecified: Secondary | ICD-10-CM | POA: Diagnosis not present

## 2018-06-21 DIAGNOSIS — J309 Allergic rhinitis, unspecified: Secondary | ICD-10-CM | POA: Diagnosis not present

## 2018-06-21 DIAGNOSIS — E538 Deficiency of other specified B group vitamins: Secondary | ICD-10-CM | POA: Diagnosis not present

## 2018-06-21 DIAGNOSIS — R69 Illness, unspecified: Secondary | ICD-10-CM | POA: Diagnosis not present

## 2018-06-21 DIAGNOSIS — G309 Alzheimer's disease, unspecified: Secondary | ICD-10-CM | POA: Diagnosis not present

## 2018-06-21 DIAGNOSIS — K219 Gastro-esophageal reflux disease without esophagitis: Secondary | ICD-10-CM | POA: Diagnosis not present

## 2018-06-21 DIAGNOSIS — R229 Localized swelling, mass and lump, unspecified: Secondary | ICD-10-CM | POA: Diagnosis not present

## 2018-06-23 DIAGNOSIS — L820 Inflamed seborrheic keratosis: Secondary | ICD-10-CM | POA: Diagnosis not present

## 2018-06-23 DIAGNOSIS — L918 Other hypertrophic disorders of the skin: Secondary | ICD-10-CM | POA: Diagnosis not present

## 2018-07-20 DIAGNOSIS — E785 Hyperlipidemia, unspecified: Secondary | ICD-10-CM | POA: Diagnosis not present

## 2018-07-20 DIAGNOSIS — Z95818 Presence of other cardiac implants and grafts: Secondary | ICD-10-CM | POA: Diagnosis not present

## 2018-07-20 DIAGNOSIS — I639 Cerebral infarction, unspecified: Secondary | ICD-10-CM | POA: Diagnosis not present

## 2018-07-20 DIAGNOSIS — Z8673 Personal history of transient ischemic attack (TIA), and cerebral infarction without residual deficits: Secondary | ICD-10-CM | POA: Diagnosis not present

## 2018-07-20 DIAGNOSIS — I6523 Occlusion and stenosis of bilateral carotid arteries: Secondary | ICD-10-CM | POA: Diagnosis not present

## 2018-07-20 DIAGNOSIS — R69 Illness, unspecified: Secondary | ICD-10-CM | POA: Diagnosis not present

## 2018-07-20 DIAGNOSIS — I10 Essential (primary) hypertension: Secondary | ICD-10-CM | POA: Diagnosis not present

## 2018-07-21 DIAGNOSIS — Z9889 Other specified postprocedural states: Secondary | ICD-10-CM | POA: Diagnosis not present

## 2018-07-22 DIAGNOSIS — E785 Hyperlipidemia, unspecified: Secondary | ICD-10-CM | POA: Diagnosis not present

## 2018-07-22 DIAGNOSIS — M159 Polyosteoarthritis, unspecified: Secondary | ICD-10-CM | POA: Diagnosis not present

## 2018-07-22 DIAGNOSIS — E039 Hypothyroidism, unspecified: Secondary | ICD-10-CM | POA: Diagnosis not present

## 2018-07-22 DIAGNOSIS — J309 Allergic rhinitis, unspecified: Secondary | ICD-10-CM | POA: Diagnosis not present

## 2018-07-22 DIAGNOSIS — G309 Alzheimer's disease, unspecified: Secondary | ICD-10-CM | POA: Diagnosis not present

## 2018-07-22 DIAGNOSIS — R69 Illness, unspecified: Secondary | ICD-10-CM | POA: Diagnosis not present

## 2018-07-22 DIAGNOSIS — K219 Gastro-esophageal reflux disease without esophagitis: Secondary | ICD-10-CM | POA: Diagnosis not present

## 2018-07-22 DIAGNOSIS — I639 Cerebral infarction, unspecified: Secondary | ICD-10-CM | POA: Diagnosis not present

## 2018-07-22 DIAGNOSIS — E538 Deficiency of other specified B group vitamins: Secondary | ICD-10-CM | POA: Diagnosis not present

## 2018-08-22 DIAGNOSIS — K219 Gastro-esophageal reflux disease without esophagitis: Secondary | ICD-10-CM | POA: Diagnosis not present

## 2018-08-22 DIAGNOSIS — M159 Polyosteoarthritis, unspecified: Secondary | ICD-10-CM | POA: Diagnosis not present

## 2018-08-22 DIAGNOSIS — E538 Deficiency of other specified B group vitamins: Secondary | ICD-10-CM | POA: Diagnosis not present

## 2018-08-22 DIAGNOSIS — E785 Hyperlipidemia, unspecified: Secondary | ICD-10-CM | POA: Diagnosis not present

## 2018-08-22 DIAGNOSIS — Z1339 Encounter for screening examination for other mental health and behavioral disorders: Secondary | ICD-10-CM | POA: Diagnosis not present

## 2018-08-22 DIAGNOSIS — G309 Alzheimer's disease, unspecified: Secondary | ICD-10-CM | POA: Diagnosis not present

## 2018-08-22 DIAGNOSIS — J309 Allergic rhinitis, unspecified: Secondary | ICD-10-CM | POA: Diagnosis not present

## 2018-08-22 DIAGNOSIS — R69 Illness, unspecified: Secondary | ICD-10-CM | POA: Diagnosis not present

## 2018-08-22 DIAGNOSIS — I639 Cerebral infarction, unspecified: Secondary | ICD-10-CM | POA: Diagnosis not present

## 2018-09-12 DIAGNOSIS — J309 Allergic rhinitis, unspecified: Secondary | ICD-10-CM | POA: Diagnosis not present

## 2018-09-12 DIAGNOSIS — E785 Hyperlipidemia, unspecified: Secondary | ICD-10-CM | POA: Diagnosis not present

## 2018-09-12 DIAGNOSIS — J208 Acute bronchitis due to other specified organisms: Secondary | ICD-10-CM | POA: Diagnosis not present

## 2018-09-12 DIAGNOSIS — M159 Polyosteoarthritis, unspecified: Secondary | ICD-10-CM | POA: Diagnosis not present

## 2018-09-12 DIAGNOSIS — I639 Cerebral infarction, unspecified: Secondary | ICD-10-CM | POA: Diagnosis not present

## 2018-09-12 DIAGNOSIS — K219 Gastro-esophageal reflux disease without esophagitis: Secondary | ICD-10-CM | POA: Diagnosis not present

## 2018-09-12 DIAGNOSIS — G309 Alzheimer's disease, unspecified: Secondary | ICD-10-CM | POA: Diagnosis not present

## 2018-09-12 DIAGNOSIS — R69 Illness, unspecified: Secondary | ICD-10-CM | POA: Diagnosis not present

## 2018-09-12 DIAGNOSIS — E538 Deficiency of other specified B group vitamins: Secondary | ICD-10-CM | POA: Diagnosis not present

## 2018-10-05 DIAGNOSIS — E538 Deficiency of other specified B group vitamins: Secondary | ICD-10-CM | POA: Diagnosis not present

## 2018-10-05 DIAGNOSIS — I679 Cerebrovascular disease, unspecified: Secondary | ICD-10-CM | POA: Diagnosis not present

## 2018-10-05 DIAGNOSIS — E785 Hyperlipidemia, unspecified: Secondary | ICD-10-CM | POA: Diagnosis not present

## 2018-10-05 DIAGNOSIS — Z1339 Encounter for screening examination for other mental health and behavioral disorders: Secondary | ICD-10-CM | POA: Diagnosis not present

## 2018-10-05 DIAGNOSIS — K219 Gastro-esophageal reflux disease without esophagitis: Secondary | ICD-10-CM | POA: Diagnosis not present

## 2018-10-05 DIAGNOSIS — J309 Allergic rhinitis, unspecified: Secondary | ICD-10-CM | POA: Diagnosis not present

## 2018-10-05 DIAGNOSIS — E039 Hypothyroidism, unspecified: Secondary | ICD-10-CM | POA: Diagnosis not present

## 2018-10-05 DIAGNOSIS — R69 Illness, unspecified: Secondary | ICD-10-CM | POA: Diagnosis not present

## 2018-10-05 DIAGNOSIS — G309 Alzheimer's disease, unspecified: Secondary | ICD-10-CM | POA: Diagnosis not present

## 2018-10-05 DIAGNOSIS — M159 Polyosteoarthritis, unspecified: Secondary | ICD-10-CM | POA: Diagnosis not present

## 2018-10-05 DIAGNOSIS — I10 Essential (primary) hypertension: Secondary | ICD-10-CM | POA: Diagnosis not present

## 2018-10-20 DIAGNOSIS — Z9889 Other specified postprocedural states: Secondary | ICD-10-CM | POA: Diagnosis not present

## 2018-10-22 DIAGNOSIS — J111 Influenza due to unidentified influenza virus with other respiratory manifestations: Secondary | ICD-10-CM | POA: Diagnosis not present

## 2018-11-02 DIAGNOSIS — E785 Hyperlipidemia, unspecified: Secondary | ICD-10-CM | POA: Diagnosis not present

## 2018-11-02 DIAGNOSIS — G309 Alzheimer's disease, unspecified: Secondary | ICD-10-CM | POA: Diagnosis not present

## 2018-11-02 DIAGNOSIS — M159 Polyosteoarthritis, unspecified: Secondary | ICD-10-CM | POA: Diagnosis not present

## 2018-11-02 DIAGNOSIS — J309 Allergic rhinitis, unspecified: Secondary | ICD-10-CM | POA: Diagnosis not present

## 2018-11-02 DIAGNOSIS — I679 Cerebrovascular disease, unspecified: Secondary | ICD-10-CM | POA: Diagnosis not present

## 2018-11-04 ENCOUNTER — Other Ambulatory Visit: Payer: Self-pay

## 2018-11-04 NOTE — Patient Outreach (Signed)
[facility]) Care Management  11/04/2018  Meigan Tolles 14-Aug-1941 [id number]   Telephone Screen  Referral Date:11/04/2018 Referral Source: MD office Referral Reason: "HTN, nursing and SW eval and treat for services needed,medication, disease process, home safety" Insurance: [employer]   Outreach attempt # 1 to patient. No answer after a minute of ringing and unable to leave message.    Plan: RN CM will make outreach attempt to patient within 3-4 business days. RN CM will send unsuccessful outreach letter to patient.  Antionette Fairy, RN,BSN,CCM [facility] Care Management Telephonic Care Management Coordinator Direct Phone: [phone] Toll Free: [phone] Fax: [phone]

## 2018-11-07 ENCOUNTER — Other Ambulatory Visit: Payer: Self-pay

## 2018-11-07 DIAGNOSIS — M159 Polyosteoarthritis, unspecified: Secondary | ICD-10-CM | POA: Diagnosis not present

## 2018-11-07 DIAGNOSIS — G309 Alzheimer's disease, unspecified: Secondary | ICD-10-CM | POA: Diagnosis not present

## 2018-11-07 DIAGNOSIS — J309 Allergic rhinitis, unspecified: Secondary | ICD-10-CM | POA: Diagnosis not present

## 2018-11-07 DIAGNOSIS — I679 Cerebrovascular disease, unspecified: Secondary | ICD-10-CM | POA: Diagnosis not present

## 2018-11-07 DIAGNOSIS — E785 Hyperlipidemia, unspecified: Secondary | ICD-10-CM | POA: Diagnosis not present

## 2018-11-07 NOTE — Patient Outreach (Signed)
[facility]) Care Management  11/07/2018  Kellie Escobar 1941/03/11 [id number]   Telephone Screen  Referral Date:11/04/2018 Referral Source: MD office Referral Reason: "HTN, nursing and SW eval and treat for services needed,medication, disease process, home safety" Insurance: [employer]    Outreach attempt #2 to patient. Spoke with patient. RN CM reviewed and discussed referral source and reason with patient. Patient kept stating multiple times that she did not need any assistance. She voiced that she has a list of meds and knows how to take them. However, her son keeps meds at his home and brings meds to her and gives meds to her daily. Patient denied needing [facility] pharmacy assistance in regards to mediation mgmt or assistance. She was adamant that she did not want help. RN CM spoke with spouse per patient consent. RN CM discussed referral source and reason with spouse. He reported that he has "been taking care of her for 59 years and don't need any help." He states everything has bene fine until lately when the patient's "oldest son has been sticking his nose where he has business." He report that the son has been managing meds and bringing to the patient daily. [facility] services discussed and reviewed with spouse and ways [facility] could help. Spouse is just as adamant as patient that they don't want anyone coming into the home and assisting them.  RN CM attempted multiple times to try to convince patient and spouse without success. Patient voiced she has an appt with PCP at 2pm today. RN CM encouraged them to discuss referral with MD during that time.Patient would not give consent to speak with son regarding her care.   RN CM contacted PCP office. LVM for Katie regarding patient and spouse refusing [facility] services and requested follow up at next MD appt.     Plan: RN CM will close case if MD office unable to convince patient to agree to services.  Antionette Fairy, RN,BSN,CCM [facility]  Care Management Telephonic Care Management Coordinator Direct Phone: [phone] Toll Free: [phone] Fax: [phone]

## 2018-11-10 ENCOUNTER — Other Ambulatory Visit: Payer: Self-pay

## 2018-11-10 NOTE — Patient Outreach (Signed)
[facility]) Care Management  11/10/2018  Kellie Escobar 1941/01/17 [id number]   Telephone Screen  Referral Date:11/04/2018 Referral Source:MD office Referral Reason:"HTN, nursing and SW eval and treat for services needed,medication, disease process, home safety" Insurance:[employer]    No response back from patient or MD. Case is being closed.    Plan: RN CM will close case at this time.  Antionette Fairy, RN,BSN,CCM [facility] Care Management Telephonic Care Management Coordinator Direct Phone: [phone] Toll Free: [phone] Fax: [phone]

## 2018-11-14 DIAGNOSIS — M159 Polyosteoarthritis, unspecified: Secondary | ICD-10-CM | POA: Diagnosis not present

## 2018-11-14 DIAGNOSIS — G309 Alzheimer's disease, unspecified: Secondary | ICD-10-CM | POA: Diagnosis not present

## 2018-11-14 DIAGNOSIS — E785 Hyperlipidemia, unspecified: Secondary | ICD-10-CM | POA: Diagnosis not present

## 2018-11-14 DIAGNOSIS — I679 Cerebrovascular disease, unspecified: Secondary | ICD-10-CM | POA: Diagnosis not present

## 2018-11-14 DIAGNOSIS — J309 Allergic rhinitis, unspecified: Secondary | ICD-10-CM | POA: Diagnosis not present

## 2018-12-12 DIAGNOSIS — I679 Cerebrovascular disease, unspecified: Secondary | ICD-10-CM | POA: Diagnosis not present

## 2018-12-12 DIAGNOSIS — J028 Acute pharyngitis due to other specified organisms: Secondary | ICD-10-CM | POA: Diagnosis not present

## 2018-12-12 DIAGNOSIS — G309 Alzheimer's disease, unspecified: Secondary | ICD-10-CM | POA: Diagnosis not present

## 2018-12-12 DIAGNOSIS — E785 Hyperlipidemia, unspecified: Secondary | ICD-10-CM | POA: Diagnosis not present

## 2018-12-12 DIAGNOSIS — M159 Polyosteoarthritis, unspecified: Secondary | ICD-10-CM | POA: Diagnosis not present

## 2019-01-10 DIAGNOSIS — M159 Polyosteoarthritis, unspecified: Secondary | ICD-10-CM | POA: Diagnosis not present

## 2019-01-10 DIAGNOSIS — E785 Hyperlipidemia, unspecified: Secondary | ICD-10-CM | POA: Diagnosis not present

## 2019-01-10 DIAGNOSIS — G309 Alzheimer's disease, unspecified: Secondary | ICD-10-CM | POA: Diagnosis not present

## 2019-01-10 DIAGNOSIS — I679 Cerebrovascular disease, unspecified: Secondary | ICD-10-CM | POA: Diagnosis not present

## 2019-01-10 DIAGNOSIS — R252 Cramp and spasm: Secondary | ICD-10-CM | POA: Diagnosis not present

## 2019-01-18 DIAGNOSIS — M159 Polyosteoarthritis, unspecified: Secondary | ICD-10-CM | POA: Diagnosis not present

## 2019-01-18 DIAGNOSIS — I639 Cerebral infarction, unspecified: Secondary | ICD-10-CM | POA: Diagnosis not present

## 2019-01-18 DIAGNOSIS — E039 Hypothyroidism, unspecified: Secondary | ICD-10-CM | POA: Diagnosis not present

## 2019-01-18 DIAGNOSIS — G309 Alzheimer's disease, unspecified: Secondary | ICD-10-CM | POA: Diagnosis not present

## 2019-01-18 DIAGNOSIS — K219 Gastro-esophageal reflux disease without esophagitis: Secondary | ICD-10-CM | POA: Diagnosis not present

## 2019-01-18 DIAGNOSIS — J309 Allergic rhinitis, unspecified: Secondary | ICD-10-CM | POA: Diagnosis not present

## 2019-02-13 DIAGNOSIS — G309 Alzheimer's disease, unspecified: Secondary | ICD-10-CM | POA: Diagnosis not present

## 2019-02-13 DIAGNOSIS — E039 Hypothyroidism, unspecified: Secondary | ICD-10-CM | POA: Diagnosis not present

## 2019-02-13 DIAGNOSIS — J309 Allergic rhinitis, unspecified: Secondary | ICD-10-CM | POA: Diagnosis not present

## 2019-02-13 DIAGNOSIS — K219 Gastro-esophageal reflux disease without esophagitis: Secondary | ICD-10-CM | POA: Diagnosis not present

## 2019-02-13 DIAGNOSIS — M159 Polyosteoarthritis, unspecified: Secondary | ICD-10-CM | POA: Diagnosis not present

## 2019-02-14 DIAGNOSIS — N3281 Overactive bladder: Secondary | ICD-10-CM | POA: Diagnosis not present

## 2019-02-14 DIAGNOSIS — G309 Alzheimer's disease, unspecified: Secondary | ICD-10-CM | POA: Diagnosis not present

## 2019-02-14 DIAGNOSIS — J309 Allergic rhinitis, unspecified: Secondary | ICD-10-CM | POA: Diagnosis not present

## 2019-02-28 DIAGNOSIS — G309 Alzheimer's disease, unspecified: Secondary | ICD-10-CM | POA: Diagnosis not present

## 2019-02-28 DIAGNOSIS — M159 Polyosteoarthritis, unspecified: Secondary | ICD-10-CM | POA: Diagnosis not present

## 2019-02-28 DIAGNOSIS — N3281 Overactive bladder: Secondary | ICD-10-CM | POA: Diagnosis not present

## 2019-03-03 DIAGNOSIS — N3281 Overactive bladder: Secondary | ICD-10-CM | POA: Diagnosis not present

## 2019-03-03 DIAGNOSIS — M159 Polyosteoarthritis, unspecified: Secondary | ICD-10-CM | POA: Diagnosis not present

## 2019-03-03 DIAGNOSIS — G309 Alzheimer's disease, unspecified: Secondary | ICD-10-CM | POA: Diagnosis not present

## 2019-03-08 DIAGNOSIS — N3281 Overactive bladder: Secondary | ICD-10-CM | POA: Diagnosis not present

## 2019-03-08 DIAGNOSIS — G309 Alzheimer's disease, unspecified: Secondary | ICD-10-CM | POA: Diagnosis not present

## 2019-03-08 DIAGNOSIS — M159 Polyosteoarthritis, unspecified: Secondary | ICD-10-CM | POA: Diagnosis not present

## 2019-03-13 DIAGNOSIS — G309 Alzheimer's disease, unspecified: Secondary | ICD-10-CM | POA: Diagnosis not present

## 2019-03-13 DIAGNOSIS — Z9181 History of falling: Secondary | ICD-10-CM | POA: Diagnosis not present

## 2019-03-13 DIAGNOSIS — N3281 Overactive bladder: Secondary | ICD-10-CM | POA: Diagnosis not present

## 2019-03-13 DIAGNOSIS — M159 Polyosteoarthritis, unspecified: Secondary | ICD-10-CM | POA: Diagnosis not present

## 2019-04-01 DIAGNOSIS — Z8673 Personal history of transient ischemic attack (TIA), and cerebral infarction without residual deficits: Secondary | ICD-10-CM | POA: Diagnosis not present

## 2019-04-01 DIAGNOSIS — I10 Essential (primary) hypertension: Secondary | ICD-10-CM | POA: Diagnosis not present

## 2019-04-01 DIAGNOSIS — K219 Gastro-esophageal reflux disease without esophagitis: Secondary | ICD-10-CM | POA: Diagnosis not present

## 2019-04-01 DIAGNOSIS — R5381 Other malaise: Secondary | ICD-10-CM | POA: Diagnosis not present

## 2019-04-01 DIAGNOSIS — R5383 Other fatigue: Secondary | ICD-10-CM | POA: Diagnosis not present

## 2019-04-01 DIAGNOSIS — K449 Diaphragmatic hernia without obstruction or gangrene: Secondary | ICD-10-CM | POA: Diagnosis not present

## 2019-04-01 DIAGNOSIS — N390 Urinary tract infection, site not specified: Secondary | ICD-10-CM | POA: Diagnosis not present

## 2019-04-01 DIAGNOSIS — I517 Cardiomegaly: Secondary | ICD-10-CM | POA: Diagnosis not present

## 2019-04-04 ENCOUNTER — Other Ambulatory Visit: Payer: Self-pay | Admitting: Pharmacist

## 2019-04-04 ENCOUNTER — Other Ambulatory Visit: Payer: Self-pay

## 2019-04-04 NOTE — Patient Outreach (Signed)
[place] [facility]) Care Management  04/04/2019  Brynleigh Sequeira Pedley 1941/01/28 [id number]   Telephone Screen  Referral Date: 04/04/2019 Referral Source: [facility] Dept. Referral Reason: " [employer] member, permission to speak with husband, needs assistance with assurance diapers, would like to switch to mail order pharmacy-told him to call [facility] or PCP" Insurance: [facility] Medicare   Outreach attempt # 1 to patient. Spoke with patient who gave verbal consent and requested call be completed with spouse. Spouse reports that he is caregiver for patient who suffered a stroke about two years ago. Patient requires assistance with ADLs/IADLs. He drives her to all appts. He denies patient having any recent falls. He voices that they have all the DME in the home they could possibly need but patient does not requires use of them at this time. Discussed referral with spouse. Advised spouse that [facility]  Does not provide incontinence supplies. Discussed with spouse that patient may have [facility] benefits with plan and eligible for some of these items through this service. He will call customer service to find out. Spouse voices that he wishes to get patient set up with mail order. He feels that it will be easier for them to get meds especially due to virus outbreak and it being risky and unsafe for them to go out. Spouse does not know how to go about doing this and voices that he needs some assistance. He is agreeable to [place] referral for possible assistance. He denies any further [facility] needs or concerns at this time.      Plan: RN CM will send [place] referral for possible assistance with setting up mail order.    Enzo Montgomery, RN,BSN,CCM [place] Management Telephonic Care Management Coordinator Direct Phone: [phone] Toll Free: [phone] Fax: [phone]

## 2019-04-04 NOTE — Patient Outreach (Signed)
[facility]) Care Management  [place]   04/04/2019  Kellie Escobar 1941-06-08 [id number]  Reason for referral: Assistance with setting up mail order   Referral source: [employer] Utilization Management Department Current insurance: [facility]  PMHx includes but not limited to:  HTN, bilateral carotid artery stenosis, dementia, stroke (no evidence of stroke based on Loop recorder), HTN, HLD, thyroid disease  Outreach:  Unsuccessful telephone call x2 today with patient and spouse.  Patient reports spouse is not available both times I called.  Patient states she has not had her medication yet today but has a home health nurse coming to the house today at 3:00 to help.    Plan: Will outreach patient and spouse again later this week.  Will send unsuccessful outreach letter to home.   Ralene Bathe, PharmD, [place] [phone]

## 2019-04-07 ENCOUNTER — Ambulatory Visit: Payer: Self-pay | Admitting: Pharmacist

## 2019-04-07 ENCOUNTER — Other Ambulatory Visit: Payer: Self-pay | Admitting: Pharmacist

## 2019-04-07 NOTE — Patient Outreach (Addendum)
[facility]) Care Management  [place]   04/07/2019  Kellie Escobar [birth year] [id number]  Reason for referral: Assistance with setting up mail order  Referral source: [place] Utilization Management Department Current insurance: [facility]  PMHx includes but not limited to:  HTN, bilateral carotid artery stenosis, dementia, stroke (no evidence of stroke based on Loop recorder), HTN, HLD, thyroid disease  Outreach:  Successful telephone call with Kellie Escobar and spouse.  HIPAA identifiers verified.   Subjective:  Patient unable to answer questions about medications and asks that I speak with her husband.  Spouse states patient has ~3 medications to take before meals, ~10-11 medications to take with food, and ~5 medications to take at bedtime.  He states he does not have a medication list and cannot tell me the names of any of the medications.  He reports that there is a nurse that comes to the house, Kellie Escobar, but does not have her contact information at the moment and does not know when she will be back next.  He states that Kellie Escobar fills up pillboxes for patient.  Each day, he removes medication from pillbox and places into an empty pillbottle containers to carry around with him all day to give to patient at appropriate times.  He states this method is working well for him.  He would like to start using mail order so that the medication can come directly to his house.    3-way call to PCP office, Dr. Truman Escobar.  Spoke with Kellie Jersey, RN, to request prescriptions be sent to [facility].  Kellie Escobar reviewed all current medications on file.  Patient has appt with Dr. Truman Escobar on Monday, June 15th, at 11:30AM.  Dr. Truman Escobar will wait to send in new RXs until after appt.    Unsuccessful call to patient's nurse, Kellie Escobar, that assists with medication management.  Message left with RN requesting a return call.    Objective: The ASCVD Risk score Kellie Bussing DC Jr., et al., 2013)  failed to calculate for the following reasons:   The patient has a prior MI or stroke diagnosis  No results found for: CREATININE  No results found for: HGBA1C  Lipid Panel  No results found for: CHOL, TRIG, HDL, CHOLHDL, VLDL, LDLCALC, LDLDIRECT  BP Readings from Last 3 Encounters:  No data found for BP    Not on File  Medications Reviewed Today   Medications were not reviewed prior to this encounter     Assessment: Drugs sorted by system:  Neurologic/Psychologic: alprazolam, donepezil, memantine, pramipexole, sesrtraline  Hematologic: clopidogrel  Cardiovascular: amlodipine, ezetimibe, losartan,   Pulmonary/Allergy: fluticasone NS  Gastrointestinal: docusate, pantoprazole, ondansetron  Endocrine: levothyroxine  Topical: olopatadine  Genitourinary: solifenacin  Vitamins/Minerals/Supplements: MVI, potassium  Medication Review Findings:  Alprazolam: This drug is identified in the [place] Criteria as a potentially inappropriate medication to be avoided in patients 65 years and older (independent of diagnosis or condition) due to increased risk of impaired cognition, delirium, falls, fractures, and motor vehicle accidents with benzodiazepine use.  Monitor use closely and adjust as clinically warranted.    Plan: . Will await call back from Kellie Free, RN, with [facility] . Will mail [name] OTC catalogue to patient  Kellie Escobar, PharmD, [place] [phone]   -Incoming call from RN.  She reports patient is on palliative care and is part of [facility].  She has been coming to patient's home for  several months.  She comes every 2-3 weeks to the home to fill pillboxes and organize medications for patients.  I updated her on patient and spouse decision to use mail order pharmacy.  Kellie FanningJulie reports she is appreciative of update and can help call in refills to the mail order pharmacy when they are  due.  She has my phone number if she needs to contact me for any reason.    -[facility] as no further medication needs noted at this time.   Kellie Escobar, PharmD, [facility] Clinical Pharmacist [facility] [phone]

## 2019-04-10 DIAGNOSIS — M159 Polyosteoarthritis, unspecified: Secondary | ICD-10-CM | POA: Diagnosis not present

## 2019-04-10 DIAGNOSIS — N3281 Overactive bladder: Secondary | ICD-10-CM | POA: Diagnosis not present

## 2019-04-10 DIAGNOSIS — G309 Alzheimer's disease, unspecified: Secondary | ICD-10-CM | POA: Diagnosis not present

## 2019-04-12 ENCOUNTER — Ambulatory Visit: Payer: Self-pay | Admitting: Pharmacist

## 2019-04-18 DIAGNOSIS — Z9889 Other specified postprocedural states: Secondary | ICD-10-CM | POA: Diagnosis not present

## 2019-05-29 ENCOUNTER — Other Ambulatory Visit: Payer: Self-pay

## 2019-05-29 ENCOUNTER — Emergency Department (HOSPITAL_COMMUNITY)
Admission: EM | Admit: 2019-05-29 | Discharge: 2019-05-30 | Disposition: A | Discharge status: Home / Self Care | Payer: Medicare Other | Attending: Emergency Medicine | Admitting: Emergency Medicine

## 2019-05-29 ENCOUNTER — Emergency Department (HOSPITAL_COMMUNITY): Payer: Medicare Other

## 2019-05-29 ENCOUNTER — Encounter (HOSPITAL_COMMUNITY): Payer: Self-pay

## 2019-05-29 DIAGNOSIS — F0390 Unspecified dementia without behavioral disturbance: Secondary | ICD-10-CM | POA: Insufficient documentation

## 2019-05-29 DIAGNOSIS — Z8673 Personal history of transient ischemic attack (TIA), and cerebral infarction without residual deficits: Secondary | ICD-10-CM | POA: Diagnosis not present

## 2019-05-29 DIAGNOSIS — I700 Atherosclerosis of aorta: Secondary | ICD-10-CM | POA: Diagnosis not present

## 2019-05-29 DIAGNOSIS — R51 Headache: Secondary | ICD-10-CM | POA: Diagnosis not present

## 2019-05-29 DIAGNOSIS — R531 Weakness: Secondary | ICD-10-CM | POA: Diagnosis not present

## 2019-05-29 DIAGNOSIS — K449 Diaphragmatic hernia without obstruction or gangrene: Secondary | ICD-10-CM | POA: Diagnosis not present

## 2019-05-29 DIAGNOSIS — I10 Essential (primary) hypertension: Secondary | ICD-10-CM | POA: Diagnosis not present

## 2019-05-29 DIAGNOSIS — Z7901 Long term (current) use of anticoagulants: Secondary | ICD-10-CM | POA: Insufficient documentation

## 2019-05-29 DIAGNOSIS — Z79899 Other long term (current) drug therapy: Secondary | ICD-10-CM | POA: Diagnosis not present

## 2019-05-29 HISTORY — DX: Cerebral infarction, unspecified: I63.9

## 2019-05-29 HISTORY — DX: Essential (primary) hypertension: I10

## 2019-05-29 HISTORY — DX: Unspecified dementia, unspecified severity, without behavioral disturbance, psychotic disturbance, mood disturbance, and anxiety: F03.90

## 2019-05-29 HISTORY — DX: Other specified postprocedural states: Z98.890

## 2019-05-29 LAB — COMPREHENSIVE METABOLIC PANEL
ALT: 20 U/L (ref 0–44)
ALT: 18 U/L (ref 0–44)
AST: 17 U/L (ref 15–41)
AST: 23 U/L (ref 15–41)
Albumin: 3.1 g/dL — ABNORMAL LOW (ref 3.5–5.0)
Albumin: 4 g/dL (ref 3.5–5.0)
Alkaline Phosphatase: 62 U/L (ref 38–126)
Alkaline Phosphatase: 83 U/L (ref 38–126)
Anion gap: 10 (ref 5–15)
Anion gap: 11 (ref 5–15)
BUN: 10 mg/dL (ref 8–23)
BUN: 17 mg/dL (ref 8–23)
CO2: 30 mmol/L (ref 22–32)
CO2: 28 mmol/L (ref 22–32)
Calcium: 8.8 mg/dL — ABNORMAL LOW (ref 8.9–10.3)
Calcium: 9.7 mg/dL (ref 8.9–10.3)
Chloride: 102 mmol/L (ref 98–111)
Chloride: 101 mmol/L (ref 98–111)
Creatinine, Ser: 1.02 mg/dL — ABNORMAL HIGH (ref 0.44–1.00)
Creatinine, Ser: 1.13 mg/dL — ABNORMAL HIGH (ref 0.44–1.00)
GFR calc Af Amer: >60 mL/min (ref >60)
GFR calc Af Amer: 54 mL/min — ABNORMAL LOW (ref >60)
GFR calc non Af Amer: 53 mL/min — ABNORMAL LOW (ref >60)
GFR calc non Af Amer: 47 mL/min — ABNORMAL LOW (ref >60)
Glucose, Bld: 113 mg/dL — ABNORMAL HIGH (ref 70–99)
Glucose, Bld: 106 mg/dL — ABNORMAL HIGH (ref 70–99)
Potassium: 4.3 mmol/L (ref 3.5–5.1)
Potassium: 3.6 mmol/L (ref 3.5–5.1)
Sodium: 142 mmol/L (ref 135–145)
Sodium: 140 mmol/L (ref 135–145)
Total Bilirubin: 0.5 mg/dL (ref 0.3–1.2)
Total Bilirubin: 0.6 mg/dL (ref 0.3–1.2)
Total Protein: 5.6 g/dL — ABNORMAL LOW (ref 6.5–8.1)
Total Protein: 6.6 g/dL (ref 6.5–8.1)

## 2019-05-29 LAB — DIFFERENTIAL
Abs Immature Granulocytes: 0.05 10*3/uL (ref 0.00–0.07)
Basophils Absolute: 0 10*3/uL (ref 0.0–0.1)
Basophils Absolute: 0 10*3/uL (ref 0.0–0.1)
Basophils Relative: 0 %
Basophils Relative: 1 %
Eosinophils Absolute: 0.2 10*3/uL (ref 0.0–0.5)
Eosinophils Absolute: 0.3 10*3/uL (ref 0.0–0.5)
Eosinophils Relative: 4 %
Eosinophils Relative: 3 %
Immature Granulocytes: 1 %
Lymphocytes Relative: 23 %
Lymphocytes Relative: 17 %
Lymphs Abs: 1.4 10*3/uL (ref 0.7–4.0)
Lymphs Abs: 1.5 10*3/uL (ref 0.7–4.0)
Monocytes Absolute: 0.5 10*3/uL (ref 0.1–1.0)
Monocytes Absolute: 0.9 10*3/uL (ref 0.1–1.0)
Monocytes Relative: 8 %
Monocytes Relative: 10 %
Neutro Abs: 3.9 10*3/uL (ref 1.7–7.7)
Neutro Abs: 6.1 10*3/uL (ref 1.7–7.7)
Neutrophils Relative %: 64 %
Neutrophils Relative %: 70 %

## 2019-05-29 LAB — CBC
HCT: 40.8 % (ref 36.0–46.0)
HCT: 41.9 % (ref 36.0–46.0)
Hemoglobin: 12.3 g/dL (ref 12.0–15.0)
Hemoglobin: 13.8 g/dL (ref 12.0–15.0)
MCH: 28.7 pg (ref 26.0–34.0)
MCH: 29.4 pg (ref 26.0–34.0)
MCHC: 30.1 g/dL (ref 30.0–36.0)
MCHC: 32.9 g/dL (ref 30.0–36.0)
MCV: 95.3 fL (ref 80.0–100.0)
MCV: 89.1 fL (ref 80.0–100.0)
Platelets: 189 10*3/uL (ref 150–400)
Platelets: 272 10*3/uL (ref 150–400)
RBC: 4.28 MIL/uL (ref 3.87–5.11)
RBC: 4.7 MIL/uL (ref 3.87–5.11)
RDW: 14.7 % (ref 11.5–15.5)
RDW: 13.9 % (ref 11.5–15.5)
WBC: 6 10*3/uL (ref 4.0–10.5)
WBC: 8.7 10*3/uL (ref 4.0–10.5)
nRBC: 0 % (ref 0.0–0.2)
nRBC: 0 % (ref 0.0–0.2)

## 2019-05-29 LAB — TROPONIN I (HIGH SENSITIVITY)
Troponin I (High Sensitivity): 6 ng/L (ref <18)
Troponin I (High Sensitivity): 5 ng/L (ref <18)

## 2019-05-29 LAB — APTT: aPTT: 30 seconds (ref 24–36)

## 2019-05-29 LAB — PROTIME-INR
INR: 1.1 (ref 0.8–1.2)
Prothrombin Time: 13.7 seconds (ref 11.4–15.2)

## 2019-05-29 MED ORDER — SODIUM CHLORIDE 0.9 % IV BOLUS
500.0000 mL | Freq: Once | INTRAVENOUS | Status: AC
  Administered 2019-05-29: 500 mL via INTRAVENOUS

## 2019-05-29 NOTE — ED Notes (Signed)
Pt given water. Aware UA needed.

## 2019-05-29 NOTE — ED Provider Notes (Signed)
[facility] Provider Note   CSN: [id number] Arrival date & time: 05/29/19  1751     History   Chief Complaint Chief Complaint  Patient presents with  . Hypertension  . Chest Pain    HPI Kellie Escobar is a 78 y.o. female.     Patient with a history of HTN, CVA, lives at home with her husband who is active caregiver, presents with son (health care POA) who reports several days of progressive, generalized weakness and fatigue. No falls, syncope, known fever, vomiting or diarrhea. Per son, who is at bedside, the patient may not be getting her medications on a regular basis and he has found her blood pressure to be elevated over the last several days. She was taken to Urgent Care today for evaluation of this concern and referred to the emergency department for further testing. The patient endorses left axillary pain intermittently described as brief, "fleeting", not associated with SoB, and can be brought on with certain movements. She states she just feels weak and tired, taking increased naps without feeling rested. Her son is concerned because of complaints of left sided headache that has been recurrent over the last week, and patient's reporting to him that she has lower extremity numbness. On this complaint, the patient reports a feeling that her legs have a sensation of tingling "like they fell asleep" and this can affect one or the other at different times. She denies dizziness or lightheadedness but just that she feels drained and weak when she attempts any activity.  The history is provided by the patient and a relative. No language interpreter was used.  Hypertension Associated symptoms include chest pain (See HPI - c/o left axillary pain) and headaches. Pertinent negatives include no abdominal pain and no shortness of breath.  Chest Pain Associated symptoms: headache and weakness   Associated symptoms: no abdominal pain, no cough, no dizziness,  no fever, no nausea, no shortness of breath and no vomiting     Past Medical History:  Diagnosis Date  . Dementia (HCC)   . History of loop recorder   . Hypertension   . Stroke [facility](HCC)     There are no active problems to display for this patient.   History reviewed. No pertinent surgical history.   OB History   No obstetric history on file.      Home Medications    Prior to Admission medications   Medication Sig Start Date End Date Taking? Authorizing Provider  ALPRAZolam Prudy Feeler(XANAX) 0.5 MG tablet Take 0.5 mg by mouth 4 (four) times daily as needed for anxiety.    [provider]  amLODipine (NORVASC) 5 MG tablet Take 5 mg by mouth daily.    [provider]  clopidogrel (PLAVIX) 75 MG tablet Take 75 mg by mouth daily.    [provider]  docusate sodium (COLACE) 100 MG capsule Take 100 mg by mouth daily as needed for mild constipation.    [provider]  donepezil (ARICEPT) 10 MG tablet Take 10 mg by mouth at bedtime.    [provider]  ezetimibe (ZETIA) 10 MG tablet Take 10 mg by mouth daily.    [provider]  fluticasone (FLONASE) 50 MCG/ACT nasal spray Place 1 spray into both nostrils daily as needed for allergies or rhinitis.    [provider]  levothyroxine (SYNTHROID) 25 MCG tablet Take 37.5 mcg by mouth daily before breakfast.    [provider]  losartan (  COZAAR) 100 MG tablet Take 100 mg by mouth daily.    [provider]  memantine (NAMENDA) 5 MG tablet Take 5 mg by mouth daily.    [provider]  Multiple Vitamins-Minerals (ALIVE ONCE DAILY WOMENS 50+ PO) Take 1 tablet by mouth daily.    [provider]  Olopatadine HCl (PATADAY) 0.2 % SOLN Apply 1 drop to eye daily.    [provider]  ondansetron (ZOFRAN) 4 MG tablet Take 4 mg by mouth every 8 (eight) hours as needed for nausea or vomiting.    [provider]  pantoprazole (PROTONIX) 40 MG tablet  Take 40 mg by mouth 2 (two) times daily.    [provider]  potassium chloride SA (K-DUR) 20 MEQ tablet Take 20 mEq by mouth daily.    [provider]  pramipexole (MIRAPEX) 0.5 MG tablet Take 0.5 mg by mouth at bedtime.    [provider]  sertraline (ZOLOFT) 50 MG tablet Take 150 mg by mouth daily.    [provider]  solifenacin (VESICARE) 5 MG tablet Take 5 mg by mouth daily.    [provider]    Family History History reviewed. No pertinent family history.  Social History Social History   Tobacco Use  . Smoking status: Never Smoker  Substance Use Topics  . Alcohol use: Never    Frequency: Never  . Drug use: Never     Allergies   Codeine, Statins, Sulfa antibiotics, Sulfamethoxazole, and Penicillins   Review of Systems Review of Systems  Constitutional: Positive for activity change and appetite change. Negative for chills and fever.  HENT: Negative.  Negative for congestion.   Eyes: Negative for visual disturbance (Patient denies).  Respiratory: Negative.  Negative for cough and shortness of breath.   Cardiovascular: Positive for chest pain (See HPI - c/o left axillary pain).  Gastrointestinal: Negative.  Negative for abdominal pain, nausea and vomiting.  Musculoskeletal: Negative.   Skin: Negative.   Neurological: Positive for weakness and headaches. Negative for dizziness, syncope and light-headedness.     Physical Exam Updated Vital Signs BP (!) 180/81   Pulse 65   Temp 98.6 F (37 C) (Oral)   Resp 15   SpO2 97%   Physical Exam Vitals signs and nursing note reviewed.  Constitutional:      General: She is not in acute distress.    Appearance: She is well-developed. She is not toxic-appearing.  HENT:     Head: Normocephalic and atraumatic.  Eyes:     Pupils: Pupils are equal, round, and reactive to light.     Comments: No conjunctival pallor.  Neck:     Musculoskeletal: Normal range of motion.   Cardiovascular:     Rate and Rhythm: Normal rate and regular rhythm.     Heart sounds: No murmur.  Pulmonary:     Effort: Pulmonary effort is normal.     Breath sounds: No wheezing, rhonchi or rales.  Abdominal:     Palpations: Abdomen is soft.     Tenderness: There is no abdominal tenderness.  Musculoskeletal:     Right lower leg: No edema.     Left lower leg: No edema.  Skin:    General: Skin is warm and dry.  Neurological:     Mental Status: She is alert.     Comments: The patient is alert and oriented. She recalls past details with clarity. CN's 3-12 grossly intact. No lateralizing weakness, facial asymmetry, speech abnormalities. Reflexes are  symmetric. She follows commands. No pronator drift.       ED Treatments / Results  Labs (all labs ordered are listed, but only abnormal results are displayed) Labs Reviewed  COMPREHENSIVE METABOLIC PANEL - Abnormal; Notable for the following components:      Result Value   Glucose, Bld 113 (*)    Creatinine, Ser 1.02 (*)    Calcium 8.8 (*)    Total Protein 5.6 (*)    Albumin 3.1 (*)    GFR calc non Af Amer 53 (*)    All other components within normal limits  COMPREHENSIVE METABOLIC PANEL - Abnormal; Notable for the following components:   Glucose, Bld 106 (*)    Creatinine, Ser 1.13 (*)    GFR calc non Af Amer 47 (*)    GFR calc Af Amer 54 (*)    All other components within normal limits  URINE CULTURE  PROTIME-INR  APTT  CBC  DIFFERENTIAL  CBC  DIFFERENTIAL  URINALYSIS, ROUTINE W REFLEX MICROSCOPIC  TROPONIN I (HIGH SENSITIVITY)  TROPONIN I (HIGH SENSITIVITY)    EKG None  Radiology Dg Chest 2 View  Result Date: 05/29/2019 CLINICAL DATA:  Left-sided headaches with visual changes, memory issues and elevated blood pressure for 1-2 weeks. EXAM: CHEST - 2 VIEW COMPARISON:  Radiographs 04/01/2019 and 03/23/2018. FINDINGS: The heart size and mediastinal contours are stable. There is diffuse aortic atherosclerosis and  ectasia. There is a moderate to large hiatal hernia. Mild atelectasis is present at the left lung base. There is no edema, pleural effusion or pneumothorax. The bones are demineralized without definite acute osseous findings allowing for a mild thoracolumbar scoliosis. Loop recorder overlies the left anterior chest wall. IMPRESSION: Stable chest without evidence of acute cardiopulmonary process. Aortic atherosclerosis and moderate to large hiatal hernia again noted. Electronically Signed   By: Richardean Sale M.D.   On: 05/29/2019 18:49   Ct Head Wo Contrast  Result Date: 05/29/2019 CLINICAL DATA:  Left headaches, visual changes and memory issues. EXAM: CT HEAD WITHOUT CONTRAST TECHNIQUE: Contiguous axial images were obtained from the base of the skull through the vertex without intravenous contrast. COMPARISON:  July 09, 2017 FINDINGS: Brain: No evidence of acute infarction, hemorrhage, hydrocephalus, extra-axial collection or mass lesion/mass effect. There is chronic diffuse atrophy. Chronic bilateral periventricular white matter small vessel ischemic changes are noted. Vascular: No hyperdense vessel is identified. Skull: Normal. Negative for fracture or focal lesion. Sinuses/Orbits: No acute finding. Other: None IMPRESSION: No focal acute intracranial abnormality identified. Chronic diffuse atrophy and chronic bilateral periventricular white matter small vessel ischemic change. Electronically Signed   By: Abelardo Diesel M.D.   On: 05/29/2019 19:40    Procedures Procedures (including critical care time)  Medications Ordered in ED Medications  sodium chloride 0.9 % bolus 500 mL (500 mLs Intravenous New Bag/Given 05/29/19 2258)     Initial Impression / Assessment and Plan / ED Course  I have reviewed the triage vital signs and the nursing notes.  Pertinent labs & imaging results that were available during my care of the patient were reviewed by me and considered in my medical decision making (see  chart for details).        Patient to ED with son with reported symptoms of generalized weakness, fatigue, with son's stated concerns for lateralized weakness, left chest pain and high blood pressure.   There is some discordance between son and father in the care of the patient. Per son, father insists on dispensing medications  and he feels she is not getting her regular medications as prescribed. She has history of stroke and son is also concerned in this regard.   The patient is oriented and considered a reliable historian concerning her symptoms. She does not have any focal deficits on exam. Blood pressure mildly elevated. No evidence infection on labs, CXR, and no acute abnormalities on head CT. She has been given fluids in the ED. She has been seen and evaluated by dr. Preston FleetingGlick.   She is felt to be safe for discharge home. Encouraged PCP follow up for recheck this week.   Discussed her medications with son [facility](HC POA), dosing of missed medications provided prior to discharge. He reports being able to fill all necessary medications tomorrow without need for prescriptions.   Final Clinical Impressions(s) / ED Diagnoses   Final diagnoses:  None   1. Generalized weakness 2. HTN  ED Discharge Orders    None       Elpidio AnisUpstill, Ailish Prospero, PA-C 05/30/19 0600    Dione BoozeGlick, David, MD 05/30/19 [phone]

## 2019-05-29 NOTE — ED Triage Notes (Addendum)
Pt endorses left sided headache, visual changes, memory issues, high blood pressure issues. Thinks she may have had a stroke 2 weeks ago. Also has had left chest pain at times. Son states that she is not taking her medication correctly.

## 2019-05-30 LAB — URINALYSIS, ROUTINE W REFLEX MICROSCOPIC
Bilirubin Urine: NEGATIVE
Glucose, UA: NEGATIVE mg/dL
Hgb urine dipstick: NEGATIVE
Ketones, ur: NEGATIVE mg/dL
Nitrite: NEGATIVE
Protein, ur: NEGATIVE mg/dL
Specific Gravity, Urine: 1.019 (ref 1.005–1.030)
pH: 6 (ref 5.0–8.0)

## 2019-05-30 MED ORDER — SERTRALINE HCL 50 MG PO TABS
50.0000 mg | ORAL_TABLET | Freq: Once | ORAL | Status: AC
  Administered 2019-05-30: 50 mg via ORAL
  Filled 2019-05-30: qty 1

## 2019-05-30 MED ORDER — PRAMIPEXOLE DIHYDROCHLORIDE 0.25 MG PO TABS
0.5000 mg | ORAL_TABLET | Freq: Once | ORAL | Status: AC
  Administered 2019-05-30: 0.5 mg via ORAL
  Filled 2019-05-30: qty 2

## 2019-05-30 NOTE — Discharge Instructions (Addendum)
You can be discharged home tonight but are strongly encouraged to follow up with your doctor for recheck of weakness and medication prescriptions.   Return to the emergency department with any high fever, pain, or new concern.

## 2019-05-30 NOTE — ED Notes (Signed)
Pt discharged from ED; instructions provided; Pt encouraged to return to ED if symptoms worsen and to f/u with PCP; Pt verbalized understanding of all instructions 

## 2019-05-31 LAB — URINE CULTURE

## 2019-06-01 ENCOUNTER — Other Ambulatory Visit: Payer: Self-pay

## 2019-06-01 NOTE — Patient Outreach (Signed)
[facility]) Care Management  06/01/2019  Kellie Escobar 1940-12-11 [id number]   Telephone Screen  Referral Date:06/01/2019 Referral Source: [facility] Referral Reason: " [employer] member, spouse trying to get zero to low cost adult diapers through company he saw on TV, member has incontinence" Insurance: [facility] Medicare   Outreach attempt # 1 to patient. Spoke with patient who gave consent to speak with spouse. Discussed and reviewed referral source and reason with caregiver. He shares that he "saw a commercial on TV about a company who will deliver right to your door adult diapers." Spouse does not recall name of company or any other info. Advised spouse that unless patient eligible for Medicaid(which he states she is not) not much assistance available for incontinence supplies. Discussed possible benefit coverage through [facility] and encouraged spouse to contact representative to inquire if patient eligible. He will follow up. Also, instructed on obtaining supplies from wholesale stores([employer] and/or [employer]) in bulk which may be lower cost. He will look into these options. He voices that he is assisting patient with her medical needs and has no further RN CM or [facility] needs or concerns at this time.       Plan: RN CM will close case as no further interventions needed at this time.    Enzo Montgomery, RN,BSN,CCM [facility] Telephonic Care Management Coordinator Direct Phone: [phone] Toll Free: [phone] Fax: [phone]

## 2019-06-07 DIAGNOSIS — I10 Essential (primary) hypertension: Secondary | ICD-10-CM | POA: Diagnosis not present

## 2019-06-07 DIAGNOSIS — D508 Other iron deficiency anemias: Secondary | ICD-10-CM | POA: Diagnosis not present

## 2019-06-07 DIAGNOSIS — N3281 Overactive bladder: Secondary | ICD-10-CM | POA: Diagnosis not present

## 2019-06-07 DIAGNOSIS — E876 Hypokalemia: Secondary | ICD-10-CM | POA: Diagnosis not present

## 2019-06-28 DIAGNOSIS — I10 Essential (primary) hypertension: Secondary | ICD-10-CM | POA: Diagnosis not present

## 2019-06-28 DIAGNOSIS — E038 Other specified hypothyroidism: Secondary | ICD-10-CM | POA: Diagnosis not present

## 2019-07-17 DIAGNOSIS — I639 Cerebral infarction, unspecified: Secondary | ICD-10-CM | POA: Diagnosis not present

## 2019-08-11 ENCOUNTER — Emergency Department (HOSPITAL_COMMUNITY)
Admission: EM | Admit: 2019-08-11 | Discharge: 2019-08-11 | Disposition: A | Discharge status: Home / Self Care | Payer: Medicare Other | Attending: Emergency Medicine | Admitting: Emergency Medicine

## 2019-08-11 ENCOUNTER — Emergency Department (HOSPITAL_COMMUNITY): Payer: Medicare Other

## 2019-08-11 ENCOUNTER — Encounter (HOSPITAL_COMMUNITY): Payer: Self-pay | Admitting: Emergency Medicine

## 2019-08-11 DIAGNOSIS — Z79899 Other long term (current) drug therapy: Secondary | ICD-10-CM | POA: Diagnosis not present

## 2019-08-11 DIAGNOSIS — F0390 Unspecified dementia without behavioral disturbance: Secondary | ICD-10-CM | POA: Diagnosis not present

## 2019-08-11 DIAGNOSIS — R197 Diarrhea, unspecified: Secondary | ICD-10-CM | POA: Diagnosis present

## 2019-08-11 DIAGNOSIS — Z7902 Long term (current) use of antithrombotics/antiplatelets: Secondary | ICD-10-CM | POA: Diagnosis not present

## 2019-08-11 DIAGNOSIS — U071 COVID-19: Secondary | ICD-10-CM | POA: Insufficient documentation

## 2019-08-11 DIAGNOSIS — E876 Hypokalemia: Secondary | ICD-10-CM | POA: Diagnosis not present

## 2019-08-11 DIAGNOSIS — R509 Fever, unspecified: Secondary | ICD-10-CM | POA: Diagnosis not present

## 2019-08-11 DIAGNOSIS — R0602 Shortness of breath: Secondary | ICD-10-CM | POA: Diagnosis not present

## 2019-08-11 DIAGNOSIS — I10 Essential (primary) hypertension: Secondary | ICD-10-CM | POA: Diagnosis not present

## 2019-08-11 DIAGNOSIS — R05 Cough: Secondary | ICD-10-CM | POA: Diagnosis not present

## 2019-08-11 DIAGNOSIS — Z20822 Contact with and (suspected) exposure to covid-19: Secondary | ICD-10-CM

## 2019-08-11 LAB — COMPREHENSIVE METABOLIC PANEL
ALT: 38 U/L (ref 0–44)
AST: 34 U/L (ref 15–41)
Albumin: 3.5 g/dL (ref 3.5–5.0)
Alkaline Phosphatase: 75 U/L (ref 38–126)
Anion gap: 11 (ref 5–15)
BUN: 11 mg/dL (ref 8–23)
CO2: 24 mmol/L (ref 22–32)
Calcium: 8.5 mg/dL — ABNORMAL LOW (ref 8.9–10.3)
Chloride: 98 mmol/L (ref 98–111)
Creatinine, Ser: 0.98 mg/dL (ref 0.44–1.00)
GFR calc Af Amer: >60 mL/min (ref >60)
GFR calc non Af Amer: 56 mL/min — ABNORMAL LOW (ref >60)
Glucose, Bld: 105 mg/dL — ABNORMAL HIGH (ref 70–99)
Potassium: 3.1 mmol/L — ABNORMAL LOW (ref 3.5–5.1)
Sodium: 133 mmol/L — ABNORMAL LOW (ref 135–145)
Total Bilirubin: 0.5 mg/dL (ref 0.3–1.2)
Total Protein: 6.8 g/dL (ref 6.5–8.1)

## 2019-08-11 LAB — CBC
HCT: 41.1 % (ref 36.0–46.0)
Hemoglobin: 13.1 g/dL (ref 12.0–15.0)
MCH: 27.7 pg (ref 26.0–34.0)
MCHC: 31.9 g/dL (ref 30.0–36.0)
MCV: 86.9 fL (ref 80.0–100.0)
Platelets: 244 10*3/uL (ref 150–400)
RBC: 4.73 MIL/uL (ref 3.87–5.11)
RDW: 13.8 % (ref 11.5–15.5)
WBC: 5.1 10*3/uL (ref 4.0–10.5)
nRBC: 0 % (ref 0.0–0.2)

## 2019-08-11 LAB — MAGNESIUM: Magnesium: 2.1 mg/dL (ref 1.7–2.4)

## 2019-08-11 LAB — LIPASE, BLOOD: Lipase: 37 U/L (ref 11–51)

## 2019-08-11 MED ORDER — SODIUM CHLORIDE 0.9 % IV BOLUS
1000.0000 mL | Freq: Once | INTRAVENOUS | Status: AC
  Administered 2019-08-11: 1000 mL via INTRAVENOUS

## 2019-08-11 MED ORDER — SODIUM CHLORIDE 0.9% FLUSH: 3.0000 mL | Freq: Once | INTRAVENOUS | Status: DC

## 2019-08-11 MED ORDER — POTASSIUM CHLORIDE CRYS ER 20 MEQ PO TBCR
80.0000 meq | EXTENDED_RELEASE_TABLET | Freq: Once | ORAL | Status: AC
  Administered 2019-08-11: 80 meq via ORAL
  Filled 2019-08-11: qty 4

## 2019-08-11 NOTE — ED Provider Notes (Signed)
[facility] EMERGENCY DEPARTMENT Provider Note   CSN: [id number] Arrival date & time: 08/11/19  1641     History   Chief Complaint Chief Complaint  Patient presents with  . Chills  . Diarrhea    HPI Kellie Escobar is a 78 y.o. female.     78 yo F with a chief complaints of diarrhea and cough.  Going on for the past few days.  The patient's husband is in the hospital with coronavirus.  Has needed significant amounts of oxygen and had some hypotension though is doing somewhat better per the son.  They deny any suspicious food intake denies dark or bloody stool.  No fevers.  Coughing up some yellowish sputum.  Having some increased weakness at home per the son.  He is concerned that she may be getting dehydrated.  The history is provided by the patient and a relative.  Diarrhea Associated symptoms: no arthralgias, no chills, no fever, no headaches, no myalgias and no vomiting   Illness Severity:  Moderate Onset quality:  Gradual Duration:  2 days Timing:  Constant Progression:  Worsening Chronicity:  New Associated symptoms: diarrhea   Associated symptoms: no chest pain, no congestion, no fever, no headaches, no myalgias, no nausea, no rhinorrhea, no shortness of breath, no vomiting and no wheezing     Past Medical History:  Diagnosis Date  . Dementia ([place])   . History of loop recorder   . Hypertension   . Stroke [facility])     There are no active problems to display for this patient.   No past surgical history on file.   OB History   No obstetric history on file.      Home Medications    Prior to Admission medications   Medication Sig Start Date End Date Taking? Authorizing Provider  ALPRAZolam Duanne Moron) 0.5 MG tablet Take 0.5 mg by mouth 4 (four) times daily.     [provider]  amLODipine (NORVASC) 5 MG tablet Take 5 mg by mouth daily.    [provider]  clopidogrel (PLAVIX) 75 MG tablet Take 75 mg by mouth daily.     [provider]  docusate sodium (COLACE) 100 MG capsule Take 100 mg by mouth daily as needed for mild constipation.    [provider]  donepezil (ARICEPT) 10 MG tablet Take 10 mg by mouth at bedtime.    [provider]  ezetimibe (ZETIA) 10 MG tablet Take 10 mg by mouth daily.    [provider]  fluticasone (FLONASE) 50 MCG/ACT nasal spray Place 1 spray into both nostrils daily as needed for allergies or rhinitis.    [provider]  levothyroxine (SYNTHROID) 25 MCG tablet Take 37.5 mcg by mouth daily before breakfast.    [provider]  losartan (COZAAR) 100 MG tablet Take 100 mg by mouth daily.    [provider]  memantine (NAMENDA) 5 MG tablet Take 5 mg by mouth daily.    [provider]  Multiple Vitamins-Minerals (ALIVE ONCE DAILY WOMENS 50+ PO) Take 1 tablet by mouth daily.    [provider]  Olopatadine HCl (PATADAY) 0.2 % SOLN Apply 1 drop to eye daily as needed (allergies).     [provider]  ondansetron (ZOFRAN) 4 MG tablet Take 4 mg by mouth every 8 (eight) hours as needed for nausea or vomiting.    [provider]  pantoprazole (PROTONIX) 40 MG tablet Take 40 mg by mouth 2 (  two) times daily.    [provider]  potassium chloride SA (K-DUR) 20 MEQ tablet Take 20 mEq by mouth daily.    [provider]  pramipexole (MIRAPEX) 0.5 MG tablet Take 0.5 mg by mouth at bedtime.    [provider]  sertraline (ZOLOFT) 50 MG tablet Take 150 mg by mouth daily.    [provider]  solifenacin (VESICARE) 5 MG tablet Take 5 mg by mouth daily.    [provider]    Family History No family history on file.  Social History Social History   Tobacco Use  . Smoking status: Never Smoker  Substance Use Topics  . Alcohol use: Never    Frequency: Never  . Drug use: Never     Allergies   Codeine, Statins, Sulfa antibiotics, Sulfamethoxazole, and  Penicillins   Review of Systems Review of Systems  Constitutional: Negative for chills and fever.  HENT: Negative for congestion and rhinorrhea.   Eyes: Negative for redness and visual disturbance.  Respiratory: Negative for shortness of breath and wheezing.   Cardiovascular: Negative for chest pain and palpitations.  Gastrointestinal: Positive for diarrhea. Negative for nausea and vomiting.  Genitourinary: Negative for dysuria and urgency.  Musculoskeletal: Negative for arthralgias and myalgias.  Skin: Negative for pallor and wound.  Neurological: Negative for dizziness and headaches.     Physical Exam Updated Vital Signs BP (!) 152/69   Pulse 68   Resp 19   SpO2 99%   Physical Exam Vitals signs and nursing note reviewed.  Constitutional:      General: She is not in acute distress.    Appearance: She is well-developed. She is not diaphoretic.  HENT:     Head: Normocephalic and atraumatic.  Eyes:     Pupils: Pupils are equal, round, and reactive to light.  Neck:     Musculoskeletal: Normal range of motion and neck supple.  Cardiovascular:     Rate and Rhythm: Normal rate and regular rhythm.     Heart sounds: No murmur. No friction rub. No gallop.   Pulmonary:     Effort: Pulmonary effort is normal.     Breath sounds: No wheezing or rales.  Abdominal:     General: There is no distension.     Palpations: Abdomen is soft.     Tenderness: There is no abdominal tenderness.  Musculoskeletal:        General: No tenderness.  Skin:    General: Skin is warm and dry.  Neurological:     Mental Status: She is alert and oriented to person, place, and time.  Psychiatric:        Behavior: Behavior normal.      ED Treatments / Results  Labs (all labs ordered are listed, but only abnormal results are displayed) Labs Reviewed  COMPREHENSIVE METABOLIC PANEL - Abnormal; Notable for the following components:      Result Value   Sodium 133 (*)    Potassium 3.1 (*)     Glucose, Bld 105 (*)    Calcium 8.5 (*)    GFR calc non Af Amer 56 (*)    All other components within normal limits  NOVEL CORONAVIRUS, NAA (HOSP ORDER, SEND-OUT TO REF LAB; TAT 18-24 HRS)  LIPASE, BLOOD  CBC  MAGNESIUM    EKG None  Radiology Dg Chest Port 1 View  Result Date: 08/11/2019 CLINICAL DATA:  Cough. Shortness of breath. Fever. Patient's husband diagnosed with COVID-19. EXAM: PORTABLE CHEST 1 VIEW COMPARISON:  05/29/2019 FINDINGS: Unchanged heart size and mediastinal contours. Aortic atherosclerosis. Large retrocardiac hiatal hernia as before. Lower lung volumes. Vague heterogeneous bilateral opacities in the mid lower lung zone predominant distribution. Implanted loop recorder projects over the left chest wall. No evidence of pulmonary edema or pneumothorax. Chronic blunting of left costophrenic angle. IMPRESSION: 1. Vague heterogeneous opacities in the mid lower lung zone predominant, nonspecific but suspicious for COVID-19 pneumonia in the setting. 2. Large retrocardiac hiatal hernia. 3.  Aortic Atherosclerosis (ICD10-I70.0). Electronically Signed   By: Narda Rutherford M.D.   On: 08/11/2019 19:25    Procedures Procedures (including critical care time)  Medications Ordered in ED Medications  sodium chloride flush (NS) 0.9 % injection 3 mL (3 mLs Intravenous Not Given 08/11/19 1937)  sodium chloride 0.9 % bolus 1,000 mL (1,000 mLs Intravenous New Bag/Given 08/11/19 1952)  potassium chloride SA (KLOR-CON) CR tablet 80 mEq (80 mEq Oral Given 08/11/19 2006)     Initial Impression / Assessment and Plan / ED Course  I have reviewed the triage vital signs and the nursing notes.  Pertinent labs & imaging results that were available during my care of the patient were reviewed by me and considered in my medical decision making (see chart for details).        44 yoF with a chief complaints of diarrhea and cough.  The patient likely has the novel coronavirus as her husband is  recently been in the hospital for the same.  She is 100% on room air.  Has been able to tolerate by mouth without difficulty.  Will obtain a laboratory evaluation to assess for dehydration or electrolyte abnormality with frequent stooling.  Chest x-ray with her cough.  Chest x-ray shows a viral pneumonia as viewed by me.  Magnesium is normal lipase LFTs are normal.  Patient has had mild hypokalemia at 3.1.  Will replenish orally.  Continues to tolerate p.o.  Not requiring oxygen.  Discharge home.   Lindley Stachnik Jipson was evaluated in Emergency Department on 08/11/2019 for the symptoms described in the history of present illness. He/she was evaluated in the context of the global COVID-19 pandemic, which necessitated consideration that the patient might be at risk for infection with the SARS-CoV-2 virus that causes COVID-19. Institutional protocols and algorithms that pertain to the evaluation of patients at risk for COVID-19 are in a state of rapid change based on information released by regulatory bodies including the CDC and federal and state organizations. These policies and algorithms were followed during the patient's care in the ED.   8:27 PM:  I have discussed the diagnosis/risks/treatment options with the patient and family and believe the pt to be eligible for discharge home to follow-up with PCP. We also discussed returning to the ED immediately if new or worsening sx occur. We discussed the sx which are most concerning (e.g., sudden worsening pain, fever, inability to tolerate by mouth) that necessitate immediate return. Medications administered to the patient during their visit and any new prescriptions provided to the patient are listed below.  Medications given during this visit Medications  sodium chloride flush (NS) 0.9 % injection 3 mL (3 mLs Intravenous Not Given 08/11/19 1937)  sodium chloride 0.9 % bolus 1,000 mL (1,000 mLs Intravenous New Bag/Given 08/11/19 1952)  potassium chloride SA  (KLOR-CON) CR tablet 80 mEq (80 mEq Oral Given 08/11/19 2006)     The patient appears reasonably screen and/or stabilized for discharge and I doubt any other medical condition or other [facility]  requiring further screening, evaluation, or treatment in the ED at this time prior to discharge.    Final Clinical Impressions(s) / ED Diagnoses   Final diagnoses:  Suspected 2019 novel coronavirus infection  Hypokalemia    ED Discharge Orders    None       Melene Plan, DO 08/11/19 2027

## 2019-08-11 NOTE — ED Triage Notes (Signed)
Pt arrives with son with c/o of diarrhea and fever x2 days. pts husband has covid 92 and is currently admitted. Pt states since yesterday she just "feels awful'.

## 2019-08-11 NOTE — Discharge Instructions (Signed)
Return for worsening weakness, sob.   Your test should come back in a few days.  They should call if it is positive.  Please follow up with your family doc. try and self isolate.     Person Under Monitoring Name: Kellie Escobar  Location: [place] [address]   Infection Prevention Recommendations for Individuals Confirmed to have, or Being Evaluated for, 2019 Novel Coronavirus (COVID-19) Infection Who Receive Care at Home  Individuals who are confirmed to have, or are being evaluated for, COVID-19 should follow the prevention steps below until a healthcare provider or local or state health department says they can return to normal activities.  Stay home except to get medical care You should restrict activities outside your home, except for getting medical care. Do not go to work, school, or public areas, and do not use public transportation or taxis.  Call ahead before visiting your doctor Before your medical appointment, call the healthcare provider and tell them that you have, or are being evaluated for, COVID-19 infection. This will help the healthcare providers office take steps to keep other people from getting infected. Ask your healthcare provider to call the local or state health department.  Monitor your symptoms Seek prompt medical attention if your illness is worsening (e.g., difficulty breathing). Before going to your medical appointment, call the healthcare provider and tell them that you have, or are being evaluated for, COVID-19 infection. Ask your healthcare provider to call the local or state health department.  Wear a facemask You should wear a facemask that covers your nose and mouth when you are in the same room with other people and when you visit a healthcare provider. People who live with or visit you should also wear a facemask while they are in the same room with you.  Separate yourself from other people in your home As much as  possible, you should stay in a different room from other people in your home. Also, you should use a separate bathroom, if available.  Avoid sharing household items You should not share dishes, drinking glasses, cups, eating utensils, towels, bedding, or other items with other people in your home. After using these items, you should wash them thoroughly with soap and water.  Cover your coughs and sneezes Cover your mouth and nose with a tissue when you cough or sneeze, or you can cough or sneeze into your sleeve. Throw used tissues in a lined trash can, and immediately wash your hands with soap and water for at least 20 seconds or use an alcohol-based hand rub.  Wash your [facility] your hands often and thoroughly with soap and water for at least 20 seconds. You can use an alcohol-based hand sanitizer if soap and water are not available and if your hands are not visibly dirty. Avoid touching your eyes, nose, and mouth with unwashed hands.   Prevention Steps for Caregivers and Household Members of Individuals Confirmed to have, or Being Evaluated for, COVID-19 Infection Being Cared for in the Home  If you live with, or provide care at home for, a person confirmed to have, or being evaluated for, COVID-19 infection please follow these guidelines to prevent infection:  Follow healthcare providers instructions Make sure that you understand and can help the patient follow any healthcare provider instructions for all care.  Provide for the patients basic needs You should help the patient with basic needs in the home and provide support for getting groceries, prescriptions, and other  personal needs.  Monitor the patients symptoms If they are getting sicker, call his or her medical provider and tell them that the patient has, or is being evaluated for, COVID-19 infection. This will help the healthcare providers office take steps to keep other people from getting infected. Ask the  healthcare provider to call the local or state health department.  Limit the number of people who have contact with the patient If possible, have only one caregiver for the patient. Other household members should stay in another home or place of residence. If this is not possible, they should stay in another room, or be separated from the patient as much as possible. Use a separate bathroom, if available. Restrict visitors who do not have an essential need to be in the home.  Keep older adults, very young children, and other sick people away from the patient Keep older adults, very young children, and those who have compromised immune systems or chronic health conditions away from the patient. This includes people with chronic heart, lung, or kidney conditions, diabetes, and cancer.  Ensure good ventilation Make sure that shared spaces in the home have good air flow, such as from an air conditioner or an opened window, weather permitting.  Wash your hands often Wash your hands often and thoroughly with soap and water for at least 20 seconds. You can use an alcohol based hand sanitizer if soap and water are not available and if your hands are not visibly dirty. Avoid touching your eyes, nose, and mouth with unwashed hands. Use disposable paper towels to dry your hands. If not available, use dedicated cloth towels and replace them when they become wet.  Wear a facemask and gloves Wear a disposable facemask at all times in the room and gloves when you touch or have contact with the patients blood, body fluids, and/or secretions or excretions, such as sweat, saliva, sputum, nasal mucus, vomit, urine, or feces.  Ensure the mask fits over your nose and mouth tightly, and do not touch it during use. Throw out disposable facemasks and gloves after using them. Do not reuse. Wash your hands immediately after removing your facemask and gloves. If your personal clothing becomes contaminated, carefully  remove clothing and launder. Wash your hands after handling contaminated clothing. Place all used disposable facemasks, gloves, and other waste in a lined container before disposing them with other household waste. Remove gloves and wash your hands immediately after handling these items.  Do not share dishes, glasses, or other household items with the patient Avoid sharing household items. You should not share dishes, drinking glasses, cups, eating utensils, towels, bedding, or other items with a patient who is confirmed to have, or being evaluated for, COVID-19 infection. After the person uses these items, you should wash them thoroughly with soap and water.  Wash laundry thoroughly Immediately remove and wash clothes or bedding that have blood, body fluids, and/or secretions or excretions, such as sweat, saliva, sputum, nasal mucus, vomit, urine, or feces, on them. Wear gloves when handling laundry from the patient. Read and follow directions on labels of laundry or clothing items and detergent. In general, wash and dry with the warmest temperatures recommended on the label.  Clean all areas the individual has used often Clean all touchable surfaces, such as counters, tabletops, doorknobs, bathroom fixtures, toilets, phones, keyboards, tablets, and bedside tables, every day. Also, clean any surfaces that may have blood, body fluids, and/or secretions or excretions on them. Wear gloves when cleaning surfaces  the patient has come in contact with. Use a diluted bleach solution (e.g., dilute bleach with 1 part bleach and 10 parts water) or a household disinfectant with a label that says EPA-registered for coronaviruses. To make a bleach solution at home, add 1 tablespoon of bleach to 1 quart (4 cups) of water. For a larger supply, add  cup of bleach to 1 gallon (16 cups) of water. Read labels of cleaning products and follow recommendations provided on product labels. Labels contain instructions for  safe and effective use of the cleaning product including precautions you should take when applying the product, such as wearing gloves or eye protection and making sure you have good ventilation during use of the product. Remove gloves and wash hands immediately after cleaning.  Monitor yourself for signs and symptoms of illness Caregivers and household members are considered close contacts, should monitor their health, and will be asked to limit movement outside of the home to the extent possible. Follow the monitoring steps for close contacts listed on the symptom monitoring form.   ? If you have additional questions, contact your local health department or call the epidemiologist on call at [phone] (available 24/7). ? This guidance is subject to change. For the most up-to-date guidance from [facility], please refer to their website: [email or web address]

## 2019-08-14 ENCOUNTER — Telehealth: Payer: Self-pay | Admitting: *Deleted

## 2019-08-14 LAB — NOVEL CORONAVIRUS, NAA (HOSP ORDER, SEND-OUT TO REF LAB; TAT 18-24 HRS): SARS-CoV-2, NAA: DETECTED — AB

## 2019-08-14 NOTE — Telephone Encounter (Signed)
Pt's caregiver/ son calls to ask if " a zpack or some other abx will help the COVID and pt's diarrhea. Pt has tested +positive+. She is not having a cough or fever that he knows of. Spouse is at [facility] but he will be disch in the next few days. They both have appts in clinic 10/29. Do we need to move these into NOV? He is advised that abx will not help pt and may increase diarrhea, he is ask to make sure her fluid intake is increased, no foods that are greasy, spicy and decrease dairy products at this time. Do you agree? Please advise of appt change

## 2019-08-15 NOTE — Telephone Encounter (Signed)
NOV is November, sorry

## 2019-08-15 NOTE — Telephone Encounter (Signed)
So let's start with tele health appt Kellie Escobar - pls change PCP

## 2019-08-15 NOTE — Telephone Encounter (Signed)
Patient established care with [facility] family practice in August and had a follow-up appointment in September.  Is she even still our patient? We are no longer listed as PCP.  Is the October 29 [facility] appointment for routine continuity care?  If yes it needs to be a telephone appointment.  If it is for a specific complaint, please let me know.  What is a NOV

## 2019-08-15 NOTE — Telephone Encounter (Signed)
The son/ caregiver states they are establishing care here,

## 2019-08-17 ENCOUNTER — Inpatient Hospital Stay (HOSPITAL_COMMUNITY)
Admission: EM | Admit: 2019-08-17 | Discharge: 2019-08-22 | DRG: 177 | Disposition: A | Discharge status: Hospice | Payer: Medicare Other | Attending: Internal Medicine | Admitting: Internal Medicine

## 2019-08-17 ENCOUNTER — Other Ambulatory Visit: Payer: Self-pay

## 2019-08-17 DIAGNOSIS — J1289 Other viral pneumonia: Secondary | ICD-10-CM | POA: Diagnosis not present

## 2019-08-17 DIAGNOSIS — Z882 Allergy status to sulfonamides status: Secondary | ICD-10-CM

## 2019-08-17 DIAGNOSIS — J9601 Acute respiratory failure with hypoxia: Secondary | ICD-10-CM | POA: Diagnosis not present

## 2019-08-17 DIAGNOSIS — E039 Hypothyroidism, unspecified: Secondary | ICD-10-CM | POA: Diagnosis not present

## 2019-08-17 DIAGNOSIS — Z8673 Personal history of transient ischemic attack (TIA), and cerebral infarction without residual deficits: Secondary | ICD-10-CM

## 2019-08-17 DIAGNOSIS — F0151 Vascular dementia with behavioral disturbance: Secondary | ICD-10-CM | POA: Diagnosis present

## 2019-08-17 DIAGNOSIS — Z66 Do not resuscitate: Secondary | ICD-10-CM | POA: Diagnosis not present

## 2019-08-17 DIAGNOSIS — Z885 Allergy status to narcotic agent status: Secondary | ICD-10-CM | POA: Diagnosis not present

## 2019-08-17 DIAGNOSIS — Z79890 Hormone replacement therapy (postmenopausal): Secondary | ICD-10-CM

## 2019-08-17 DIAGNOSIS — Z79899 Other long term (current) drug therapy: Secondary | ICD-10-CM

## 2019-08-17 DIAGNOSIS — E871 Hypo-osmolality and hyponatremia: Secondary | ICD-10-CM | POA: Diagnosis present

## 2019-08-17 DIAGNOSIS — U071 COVID-19: Secondary | ICD-10-CM | POA: Diagnosis not present

## 2019-08-17 DIAGNOSIS — J1282 Pneumonia due to coronavirus disease 2019: Secondary | ICD-10-CM | POA: Diagnosis present

## 2019-08-17 DIAGNOSIS — Z7902 Long term (current) use of antithrombotics/antiplatelets: Secondary | ICD-10-CM | POA: Diagnosis not present

## 2019-08-17 DIAGNOSIS — I10 Essential (primary) hypertension: Secondary | ICD-10-CM | POA: Diagnosis not present

## 2019-08-17 DIAGNOSIS — R300 Dysuria: Secondary | ICD-10-CM | POA: Diagnosis not present

## 2019-08-17 DIAGNOSIS — Z880 Allergy status to penicillin: Secondary | ICD-10-CM

## 2019-08-17 DIAGNOSIS — Z888 Allergy status to other drugs, medicaments and biological substances status: Secondary | ICD-10-CM | POA: Diagnosis not present

## 2019-08-17 DIAGNOSIS — F01518 Vascular dementia, unspecified severity, with other behavioral disturbance: Secondary | ICD-10-CM | POA: Diagnosis present

## 2019-08-17 DIAGNOSIS — F419 Anxiety disorder, unspecified: Secondary | ICD-10-CM | POA: Diagnosis present

## 2019-08-17 DIAGNOSIS — F329 Major depressive disorder, single episode, unspecified: Secondary | ICD-10-CM | POA: Diagnosis present

## 2019-08-17 DIAGNOSIS — F0390 Unspecified dementia without behavioral disturbance: Secondary | ICD-10-CM | POA: Diagnosis present

## 2019-08-17 LAB — COMPREHENSIVE METABOLIC PANEL
ALT: 22 U/L (ref 0–44)
AST: 28 U/L (ref 15–41)
Albumin: 3.7 g/dL (ref 3.5–5.0)
Alkaline Phosphatase: 89 U/L (ref 38–126)
Anion gap: 12 (ref 5–15)
BUN: 13 mg/dL (ref 8–23)
CO2: 20 mmol/L — ABNORMAL LOW (ref 22–32)
Calcium: 9.2 mg/dL (ref 8.9–10.3)
Chloride: 101 mmol/L (ref 98–111)
Creatinine, Ser: 0.91 mg/dL (ref 0.44–1.00)
GFR calc Af Amer: >60 mL/min (ref >60)
GFR calc non Af Amer: >60 mL/min (ref >60)
Glucose, Bld: 142 mg/dL — ABNORMAL HIGH (ref 70–99)
Potassium: 3.7 mmol/L (ref 3.5–5.1)
Sodium: 133 mmol/L — ABNORMAL LOW (ref 135–145)
Total Bilirubin: 0.8 mg/dL (ref 0.3–1.2)
Total Protein: 7.3 g/dL (ref 6.5–8.1)

## 2019-08-17 LAB — CBC WITH DIFFERENTIAL/PLATELET
Abs Immature Granulocytes: 0.03 10*3/uL (ref 0.00–0.07)
Basophils Absolute: 0 10*3/uL (ref 0.0–0.1)
Basophils Relative: 0 %
Eosinophils Absolute: 0 10*3/uL (ref 0.0–0.5)
Eosinophils Relative: 1 %
HCT: 41.9 % (ref 36.0–46.0)
Hemoglobin: 13.3 g/dL (ref 12.0–15.0)
Immature Granulocytes: 0 %
Lymphocytes Relative: 18 %
Lymphs Abs: 1.3 10*3/uL (ref 0.7–4.0)
MCH: 27.3 pg (ref 26.0–34.0)
MCHC: 31.7 g/dL (ref 30.0–36.0)
MCV: 85.9 fL (ref 80.0–100.0)
Monocytes Absolute: 0.6 10*3/uL (ref 0.1–1.0)
Monocytes Relative: 9 %
Neutro Abs: 5 10*3/uL (ref 1.7–7.7)
Neutrophils Relative %: 72 %
Platelets: 280 10*3/uL (ref 150–400)
RBC: 4.88 MIL/uL (ref 3.87–5.11)
RDW: 14.4 % (ref 11.5–15.5)
WBC: 7 10*3/uL (ref 4.0–10.5)
nRBC: 0 % (ref 0.0–0.2)

## 2019-08-17 LAB — LACTIC ACID, PLASMA
Lactic Acid, Venous: 2 mmol/L (ref 0.5–1.9)
Lactic Acid, Venous: 0.8 mmol/L (ref 0.5–1.9)

## 2019-08-17 MED ORDER — SODIUM CHLORIDE 0.9% FLUSH: 3.0000 mL | Freq: Once | INTRAVENOUS | Status: DC

## 2019-08-17 NOTE — Progress Notes (Signed)
CSW has reviewed the chart and is working with [facility] in determining safe disposition. CSW in contact with Hinton Dyer at [facility] who confirms that they have beds and are accepting COVID+ patients.   CSW and RNCM in contact with patients son, Kenneth Lax who is also a patient here at [facility]. Chrissie Noa was instructed by [facility] APS to bring himself and parents to [facility] as he cannot manage his symptoms and caring for his parents who are also COVID+.   CSW will begin working patient up for SNF as Hinton Dyer from [facility] states she will review the family's referral, including her husband, on tomorrow.  TOC team will continue to follow patient.  [facility]  Clinical Social Worker  Ph: [phone]

## 2019-08-17 NOTE — ED Triage Notes (Addendum)
Pt's son brought his parents in because he is not able to take care of his Covid positive as well as his himself. [facility] Dept and APS advise the son to bring parents here for care. Pt's mother has dementia. Pt is currently asymptomatic.

## 2019-08-17 NOTE — ED Provider Notes (Addendum)
[name] Provider Note   CSN: [id number] Arrival date & time: 08/17/19  1817     History   Chief Complaint Chief Complaint  Patient presents with  . Covid (+)    08/11/2019    HPI Kellie Escobar is a 78 y.o. female.     HPI Level 5 caveat for dementia.  78 year old female with history of dementia, stroke comes in a chief complaint of COVID-19 infection.  She was diagnosed with COVID-19 about a week ago when she had come in with diarrhea, chills.  Her husband had COVID-19 and was [facility] and discharges 2 or 3 days ago.  Patient's son brought her to the ER because he also has COVID-19 infection is getting sicker, and will be admitted to the hospital today.  He had called [facility] and social work, and they recommended that if he is coming to the hospital for admission then he should bring his parents here as well so that they can be placed at a nursing home.  Patient is demented and unable to take care of herself. She has no complaints from her side.  Past Medical History:  Diagnosis Date  . Dementia (HCC)   . History of loop recorder   . Hypertension   . Stroke [facility](HCC)     There are no active problems to display for this patient.   No past surgical history on file.   OB History   No obstetric history on file.      Home Medications    Prior to Admission medications   Medication Sig Start Date End Date Taking? Authorizing Provider  ALPRAZolam Prudy Feeler(XANAX) 0.5 MG tablet Take 0.5 mg by mouth 4 (four) times daily.     [provider]  amLODipine (NORVASC) 5 MG tablet Take 5 mg by mouth daily.    [provider]  clopidogrel (PLAVIX) 75 MG tablet Take 75 mg by mouth daily.    [provider]  docusate sodium (COLACE) 100 MG capsule Take 100 mg by mouth daily as needed for mild constipation.    [provider]  donepezil (ARICEPT) 10 MG tablet Take  10 mg by mouth at bedtime.    [provider]  ezetimibe (ZETIA) 10 MG tablet Take 10 mg by mouth daily.    [provider]  fluticasone (FLONASE) 50 MCG/ACT nasal spray Place 1 spray into both nostrils daily as needed for allergies or rhinitis.    [provider]  levothyroxine (SYNTHROID) 25 MCG tablet Take 37.5 mcg by mouth daily before breakfast.    [provider]  losartan (COZAAR) 100 MG tablet Take 100 mg by mouth daily.    [provider]  memantine (NAMENDA) 5 MG tablet Take 5 mg by mouth daily.    [provider]  Multiple Vitamins-Minerals (ALIVE ONCE DAILY WOMENS 50+ PO) Take 1 tablet by mouth daily.    [provider]  Olopatadine HCl (PATADAY) 0.2 % SOLN Apply 1 drop to eye daily as needed (allergies).     [provider]  ondansetron (ZOFRAN) 4 MG tablet Take 4 mg by mouth every 8 (eight) hours as needed for nausea or vomiting.    [provider]  pantoprazole (PROTONIX) 40 MG tablet Take 40 mg by mouth 2 (two) times daily.    [provider]  potassium chloride SA (K-DUR) 20 MEQ tablet Take 20 mEq by mouth daily.    [provider]  pramipexole (MIRAPEX) 0.5 MG tablet Take 0.5 mg by mouth at bedtime.    [provider]  sertraline (ZOLOFT) 50 MG tablet Take 150 mg by mouth daily.    [provider]  solifenacin (VESICARE) 5 MG tablet Take 5 mg by mouth daily.    [provider]    Family History No family history on file.  Social History Social History   Tobacco Use  . Smoking status: Never Smoker  Substance Use Topics  . Alcohol use: Never    Frequency: Never  . Drug use: Never     Allergies   Codeine, Statins, Sulfa antibiotics, Sulfamethoxazole, and Penicillins   Review of Systems Review of Systems  Unable to perform ROS: Dementia     Physical Exam Updated Vital Signs BP (!) 157/83   Pulse 80   Temp 98.4 F (36.9 C) (Oral)    Resp 20   SpO2 96%   Physical Exam Vitals signs and nursing note reviewed.  Constitutional:      Appearance: She is well-developed.  HENT:     Head: Normocephalic and atraumatic.  Neck:     Musculoskeletal: Normal range of motion and neck supple.  Cardiovascular:     Rate and Rhythm: Normal rate.  Pulmonary:     Effort: Pulmonary effort is normal.  Abdominal:     General: Bowel sounds are normal.  Skin:    General: Skin is warm and dry.  Neurological:     Mental Status: She is alert.      ED Treatments / Results  Labs (all labs ordered are listed, but only abnormal results are displayed) Labs Reviewed  LACTIC ACID, PLASMA - Abnormal; Notable for the following components:      Result Value   Lactic Acid, Venous 2.0 (*)    All other components within normal limits  COMPREHENSIVE METABOLIC PANEL - Abnormal; Notable for the following components:   Sodium 133 (*)    CO2 20 (*)    Glucose, Bld 142 (*)    All other components within normal limits  LACTIC ACID, PLASMA  CBC WITH DIFFERENTIAL/PLATELET  URINALYSIS, ROUTINE W REFLEX MICROSCOPIC    EKG EKG Interpretation  Date/Time:  Thursday August 17 2019 19:50:41 EDT Ventricular Rate:  89 PR Interval:  172 QRS Duration: 72 QT Interval:  362 QTC Calculation: 440 R Axis:   56 Text Interpretation:  Normal sinus rhythm Normal ECG No acute changes Confirmed by Varney Biles [phone]) on 08/17/2019 9:30:25 PM   Radiology No results found.  Procedures Procedures (including critical care time)  Medications Ordered in ED Medications  sodium chloride flush (NS) 0.9 % injection 3 mL (has no administration in time range)     Initial Impression / Assessment and Plan / ED Course  I have reviewed the triage vital signs and the nursing notes.  Pertinent labs & imaging results that were available during my care of the patient were reviewed by me and considered in my medical decision making (see chart for details).          78 year old with dementia has COVID-19.   Her son, who is the primary caretaker has gotten much sicker with his COVID-45 and and requirement for admission.  He brought his parents to the ER at the recommendation of [place] team and Adult [employer] recommendations.  It appears that we will have to seek placement.   Case management consulted.  They think if patient is admitted to  [facility] campus it will be easier for them to get her placed.  CODE STATUS: DNR / no CPR - but the son wants to ensure we check the living will that is in file. He has medical POA for the patient.   Kellie Escobar was evaluated in Emergency Department on 08/17/2019 for the symptoms described in the history of present illness. She was evaluated in the context of the global COVID-19 pandemic, which necessitated consideration that the patient might be at risk for infection with the SARS-CoV-2 virus that causes COVID-19. Institutional protocols and algorithms that pertain to the evaluation of patients at risk for COVID-19 are in a state of rapid change based on information released by regulatory bodies including the CDC and federal and state organizations. These policies and algorithms were followed during the patient's care in the ED.  Final Clinical Impressions(s) / ED Diagnoses   Final diagnoses:  COVID-19 virus infection    ED Discharge Orders    None           Derwood Kaplan, MD 08/17/19 2355

## 2019-08-17 NOTE — NC FL2 (Signed)
[place] LEVEL OF CARE SCREENING TOOL     IDENTIFICATION  Patient Name: Kellie Escobar Birthdate: 08-12-1941 Sex: female Admission Date (Current Location): 08/17/2019  [facility] and [place] Number:  Herbalist and Address:  [facility], [place] [address], [place], [place] [address]      Provider Number: [id number]  Attending Physician Name and Address:  Varney Biles, MD  Relative Name and Phone Number:  [facility]: [phone]    Current Level of Care: Hospital Recommended Level of Care: [place] Prior Approval Number:    Date Approved/Denied:   PASRR Number:    Discharge Plan: SNF    Current Diagnoses: There are no active problems to display for this patient.   Orientation RESPIRATION BLADDER Height & Weight     (orientation fluctuates. Patient has dementia)  Normal Continent Weight:   Height:     BEHAVIORAL SYMPTOMS/MOOD NEUROLOGICAL BOWEL NUTRITION STATUS      Continent    AMBULATORY STATUS COMMUNICATION OF NEEDS Skin   Limited Assist Verbally Normal                       Personal Care Assistance Level of Assistance  Bathing, Feeding, Dressing Bathing Assistance: Limited assistance Feeding assistance: Limited assistance Dressing Assistance: Maximum assistance     Functional Limitations Info  Sight, Hearing, Speech Sight Info: Adequate Hearing Info: Adequate Speech Info: Adequate    SPECIAL CARE FACTORS FREQUENCY  PT (By licensed PT), OT (By licensed OT)     PT Frequency: 3x weekly OT Frequency: 3x weekly            Contractures Contractures Info: Not present    Additional Factors Info  Allergies   Allergies Info: Codeine, Statins, Sulfa Antibiotics, Sulfamethoxazole, Penicillins           Current Medications (08/17/2019):  This is the current hospital active medication list Current Facility-Administered Medications  Medication Dose Route Frequency Provider Last  Rate Last Dose  . sodium chloride flush (NS) 0.9 % injection 3 mL  3 mL Intravenous Once Varney Biles, MD       Current Outpatient Medications  Medication Sig Dispense Refill  . ALPRAZolam (XANAX) 0.5 MG tablet Take 0.5 mg by mouth 4 (four) times daily.     Marland Kitchen amLODipine (NORVASC) 5 MG tablet Take 5 mg by mouth daily.    . clopidogrel (PLAVIX) 75 MG tablet Take 75 mg by mouth daily.    Marland Kitchen docusate sodium (COLACE) 100 MG capsule Take 100 mg by mouth daily as needed for mild constipation.    Marland Kitchen donepezil (ARICEPT) 10 MG tablet Take 10 mg by mouth at bedtime.    Marland Kitchen ezetimibe (ZETIA) 10 MG tablet Take 10 mg by mouth daily.    . fluticasone (FLONASE) 50 MCG/ACT nasal spray Place 1 spray into both nostrils daily as needed for allergies or rhinitis.    Marland Kitchen levothyroxine (SYNTHROID) 25 MCG tablet Take 37.5 mcg by mouth daily before breakfast.    . losartan (COZAAR) 100 MG tablet Take 100 mg by mouth daily.    . memantine (NAMENDA) 5 MG tablet Take 5 mg by mouth daily.    . Multiple Vitamins-Minerals (ALIVE ONCE DAILY WOMENS 50+ PO) Take 1 tablet by mouth daily.    . Olopatadine HCl (PATADAY) 0.2 % SOLN Apply 1 drop to eye daily as needed (allergies).     . ondansetron (ZOFRAN) 4 MG tablet Take 4  mg by mouth every 8 (eight) hours as needed for nausea or vomiting.    . pantoprazole (PROTONIX) 40 MG tablet Take 40 mg by mouth 2 (two) times daily.    . potassium chloride SA (K-DUR) 20 MEQ tablet Take 20 mEq by mouth daily.    . pramipexole (MIRAPEX) 0.5 MG tablet Take 0.5 mg by mouth at bedtime.    . sertraline (ZOLOFT) 50 MG tablet Take 150 mg by mouth daily.    . solifenacin (VESICARE) 5 MG tablet Take 5 mg by mouth daily.       Discharge Medications: Please see discharge summary for a list of discharge medications.  Relevant Imaging Results:  Relevant Lab Results:   Additional Information SS# [id number]  Jillian Warth Sherryle Lis, LCSW

## 2019-08-18 ENCOUNTER — Observation Stay (HOSPITAL_COMMUNITY): Payer: Medicare Other

## 2019-08-18 ENCOUNTER — Encounter (HOSPITAL_COMMUNITY): Payer: Self-pay | Admitting: Internal Medicine

## 2019-08-18 DIAGNOSIS — R918 Other nonspecific abnormal finding of lung field: Secondary | ICD-10-CM | POA: Diagnosis not present

## 2019-08-18 DIAGNOSIS — U071 COVID-19: Principal | ICD-10-CM

## 2019-08-18 DIAGNOSIS — F329 Major depressive disorder, single episode, unspecified: Secondary | ICD-10-CM | POA: Diagnosis present

## 2019-08-18 DIAGNOSIS — Z8673 Personal history of transient ischemic attack (TIA), and cerebral infarction without residual deficits: Secondary | ICD-10-CM | POA: Diagnosis not present

## 2019-08-18 DIAGNOSIS — F01518 Vascular dementia, unspecified severity, with other behavioral disturbance: Secondary | ICD-10-CM | POA: Diagnosis present

## 2019-08-18 DIAGNOSIS — J1289 Other viral pneumonia: Secondary | ICD-10-CM | POA: Diagnosis not present

## 2019-08-18 DIAGNOSIS — Z880 Allergy status to penicillin: Secondary | ICD-10-CM | POA: Diagnosis not present

## 2019-08-18 DIAGNOSIS — F0390 Unspecified dementia without behavioral disturbance: Secondary | ICD-10-CM | POA: Diagnosis present

## 2019-08-18 DIAGNOSIS — K449 Diaphragmatic hernia without obstruction or gangrene: Secondary | ICD-10-CM | POA: Diagnosis not present

## 2019-08-18 DIAGNOSIS — Z79890 Hormone replacement therapy (postmenopausal): Secondary | ICD-10-CM | POA: Diagnosis not present

## 2019-08-18 DIAGNOSIS — R300 Dysuria: Secondary | ICD-10-CM | POA: Diagnosis not present

## 2019-08-18 DIAGNOSIS — Z885 Allergy status to narcotic agent status: Secondary | ICD-10-CM | POA: Diagnosis not present

## 2019-08-18 DIAGNOSIS — I10 Essential (primary) hypertension: Secondary | ICD-10-CM | POA: Diagnosis not present

## 2019-08-18 DIAGNOSIS — E039 Hypothyroidism, unspecified: Secondary | ICD-10-CM | POA: Diagnosis present

## 2019-08-18 DIAGNOSIS — F0151 Vascular dementia with behavioral disturbance: Secondary | ICD-10-CM | POA: Diagnosis present

## 2019-08-18 DIAGNOSIS — Z66 Do not resuscitate: Secondary | ICD-10-CM | POA: Diagnosis not present

## 2019-08-18 DIAGNOSIS — E871 Hypo-osmolality and hyponatremia: Secondary | ICD-10-CM | POA: Diagnosis not present

## 2019-08-18 DIAGNOSIS — F419 Anxiety disorder, unspecified: Secondary | ICD-10-CM | POA: Diagnosis present

## 2019-08-18 DIAGNOSIS — Z888 Allergy status to other drugs, medicaments and biological substances status: Secondary | ICD-10-CM | POA: Diagnosis not present

## 2019-08-18 DIAGNOSIS — J1282 Pneumonia due to coronavirus disease 2019: Secondary | ICD-10-CM | POA: Diagnosis present

## 2019-08-18 DIAGNOSIS — Z79899 Other long term (current) drug therapy: Secondary | ICD-10-CM | POA: Diagnosis not present

## 2019-08-18 DIAGNOSIS — Z882 Allergy status to sulfonamides status: Secondary | ICD-10-CM | POA: Diagnosis not present

## 2019-08-18 DIAGNOSIS — J9601 Acute respiratory failure with hypoxia: Secondary | ICD-10-CM | POA: Diagnosis not present

## 2019-08-18 DIAGNOSIS — Z7902 Long term (current) use of antithrombotics/antiplatelets: Secondary | ICD-10-CM | POA: Diagnosis not present

## 2019-08-18 LAB — CBC WITH DIFFERENTIAL/PLATELET
Abs Immature Granulocytes: 0.02 10*3/uL (ref 0.00–0.07)
Basophils Absolute: 0 10*3/uL (ref 0.0–0.1)
Basophils Relative: 0 %
Eosinophils Absolute: 0 10*3/uL (ref 0.0–0.5)
Eosinophils Relative: 0 %
HCT: 40.9 % (ref 36.0–46.0)
Hemoglobin: 13.5 g/dL (ref 12.0–15.0)
Immature Granulocytes: 0 %
Lymphocytes Relative: 12 %
Lymphs Abs: 0.6 10*3/uL — ABNORMAL LOW (ref 0.7–4.0)
MCH: 28.2 pg (ref 26.0–34.0)
MCHC: 33 g/dL (ref 30.0–36.0)
MCV: 85.6 fL (ref 80.0–100.0)
Monocytes Absolute: 0.1 10*3/uL (ref 0.1–1.0)
Monocytes Relative: 2 %
Neutro Abs: 4.8 10*3/uL (ref 1.7–7.7)
Neutrophils Relative %: 86 %
Platelets: 261 10*3/uL (ref 150–400)
RBC: 4.78 MIL/uL (ref 3.87–5.11)
RDW: 14.5 % (ref 11.5–15.5)
WBC: 5.6 10*3/uL (ref 4.0–10.5)
nRBC: 0 % (ref 0.0–0.2)

## 2019-08-18 LAB — C-REACTIVE PROTEIN: CRP: 2 mg/dL — ABNORMAL HIGH (ref <1.0)

## 2019-08-18 LAB — GLUCOSE, CAPILLARY: Glucose-Capillary: 176 mg/dL — ABNORMAL HIGH (ref 70–99)

## 2019-08-18 LAB — COMPREHENSIVE METABOLIC PANEL
ALT: 26 U/L (ref 0–44)
AST: 34 U/L (ref 15–41)
Albumin: 3.5 g/dL (ref 3.5–5.0)
Alkaline Phosphatase: 86 U/L (ref 38–126)
Anion gap: 10 (ref 5–15)
BUN: 15 mg/dL (ref 8–23)
CO2: 24 mmol/L (ref 22–32)
Calcium: 9.1 mg/dL (ref 8.9–10.3)
Chloride: 104 mmol/L (ref 98–111)
Creatinine, Ser: 0.94 mg/dL (ref 0.44–1.00)
GFR calc Af Amer: >60 mL/min (ref >60)
GFR calc non Af Amer: 59 mL/min — ABNORMAL LOW (ref >60)
Glucose, Bld: 168 mg/dL — ABNORMAL HIGH (ref 70–99)
Potassium: 4.7 mmol/L (ref 3.5–5.1)
Sodium: 138 mmol/L (ref 135–145)
Total Bilirubin: 0.4 mg/dL (ref 0.3–1.2)
Total Protein: 7.1 g/dL (ref 6.5–8.1)

## 2019-08-18 LAB — CBG MONITORING, ED: Glucose-Capillary: 159 mg/dL — ABNORMAL HIGH (ref 70–99)

## 2019-08-18 LAB — ABO/RH: ABO/RH(D): A POS

## 2019-08-18 LAB — FERRITIN: Ferritin: 107 ng/mL (ref 11–307)

## 2019-08-18 MED ORDER — SERTRALINE HCL 50 MG PO TABS
150.0000 mg | ORAL_TABLET | Freq: Every day | ORAL | Status: DC
  Administered 2019-08-18 – 2019-08-22 (×5): 150 mg via ORAL
  Filled 2019-08-18: qty 3
  Filled 2019-08-18: qty 1
  Filled 2019-08-18 (×3): qty 3

## 2019-08-18 MED ORDER — LOSARTAN POTASSIUM 25 MG PO TABS
100.0000 mg | ORAL_TABLET | Freq: Every day | ORAL | Status: DC
  Administered 2019-08-18 – 2019-08-22 (×5): 100 mg via ORAL
  Filled 2019-08-18: qty 2
  Filled 2019-08-18 (×4): qty 4

## 2019-08-18 MED ORDER — ONDANSETRON HCL 4 MG PO TABS: 4.0000 mg | ORAL_TABLET | Freq: Three times a day (TID) | ORAL | Status: DC | PRN

## 2019-08-18 MED ORDER — DARIFENACIN HYDROBROMIDE ER 7.5 MG PO TB24
7.5000 mg | ORAL_TABLET | Freq: Every day | ORAL | Status: DC
  Administered 2019-08-18 – 2019-08-22 (×5): 7.5 mg via ORAL
  Filled 2019-08-18 (×7): qty 1

## 2019-08-18 MED ORDER — DONEPEZIL HCL 10 MG PO TABS
10.0000 mg | ORAL_TABLET | Freq: Every day | ORAL | Status: DC
  Administered 2019-08-18 – 2019-08-21 (×4): 10 mg via ORAL
  Filled 2019-08-18 (×5): qty 1

## 2019-08-18 MED ORDER — FLUTICASONE PROPIONATE 50 MCG/ACT NA SUSP: 1.0000 | Freq: Every day | NASAL | Status: DC | PRN

## 2019-08-18 MED ORDER — PRAMIPEXOLE DIHYDROCHLORIDE 0.25 MG PO TABS
0.5000 mg | ORAL_TABLET | Freq: Every day | ORAL | Status: DC
  Administered 2019-08-18 – 2019-08-21 (×4): 0.5 mg via ORAL
  Filled 2019-08-18 (×5): qty 2

## 2019-08-18 MED ORDER — SODIUM CHLORIDE 0.9 % IV SOLN
200.0000 mg | Freq: Once | INTRAVENOUS | Status: AC
  Administered 2019-08-18: 10:00:00 200 mg via INTRAVENOUS
  Filled 2019-08-18: qty 40

## 2019-08-18 MED ORDER — ALPRAZOLAM 0.5 MG PO TABS
0.5000 mg | ORAL_TABLET | Freq: Four times a day (QID) | ORAL | Status: DC
  Administered 2019-08-18 – 2019-08-22 (×14): 0.5 mg via ORAL
  Filled 2019-08-18 (×4): qty 1
  Filled 2019-08-18: qty 2
  Filled 2019-08-18 (×10): qty 1

## 2019-08-18 MED ORDER — ONDANSETRON HCL 4 MG/2ML IJ SOLN: 4.0000 mg | Freq: Four times a day (QID) | INTRAMUSCULAR | Status: DC | PRN

## 2019-08-18 MED ORDER — DOCUSATE SODIUM 100 MG PO CAPS: 100.0000 mg | ORAL_CAPSULE | Freq: Every day | ORAL | Status: DC | PRN

## 2019-08-18 MED ORDER — DEXAMETHASONE SODIUM PHOSPHATE 10 MG/ML IJ SOLN
6.0000 mg | Freq: Every day | INTRAMUSCULAR | Status: DC
  Administered 2019-08-18 – 2019-08-22 (×5): 6 mg via INTRAVENOUS
  Filled 2019-08-18 (×5): qty 1

## 2019-08-18 MED ORDER — POTASSIUM CHLORIDE CRYS ER 20 MEQ PO TBCR
20.0000 meq | EXTENDED_RELEASE_TABLET | Freq: Every day | ORAL | Status: DC
  Administered 2019-08-18 – 2019-08-22 (×5): 20 meq via ORAL
  Filled 2019-08-18 (×5): qty 1

## 2019-08-18 MED ORDER — AMLODIPINE BESYLATE 5 MG PO TABS
5.0000 mg | ORAL_TABLET | Freq: Every day | ORAL | Status: DC
  Administered 2019-08-18 – 2019-08-22 (×5): 5 mg via ORAL
  Filled 2019-08-18 (×6): qty 1

## 2019-08-18 MED ORDER — CLOPIDOGREL BISULFATE 75 MG PO TABS
75.0000 mg | ORAL_TABLET | Freq: Every day | ORAL | Status: DC
  Administered 2019-08-18 – 2019-08-22 (×5): 75 mg via ORAL
  Filled 2019-08-18 (×5): qty 1

## 2019-08-18 MED ORDER — SODIUM CHLORIDE 0.9 % IV SOLN
100.0000 mg | INTRAVENOUS | Status: AC
  Administered 2019-08-19 – 2019-08-22 (×4): 100 mg via INTRAVENOUS
  Filled 2019-08-18 (×5): qty 20

## 2019-08-18 MED ORDER — ENOXAPARIN SODIUM 40 MG/0.4ML ~~LOC~~ SOLN
40.0000 mg | SUBCUTANEOUS | Status: DC
  Administered 2019-08-18 – 2019-08-22 (×5): 40 mg via SUBCUTANEOUS
  Filled 2019-08-18 (×5): qty 0.4

## 2019-08-18 MED ORDER — PANTOPRAZOLE SODIUM 40 MG PO TBEC
40.0000 mg | DELAYED_RELEASE_TABLET | Freq: Two times a day (BID) | ORAL | Status: DC
  Administered 2019-08-18 – 2019-08-22 (×9): 40 mg via ORAL
  Filled 2019-08-18 (×8): qty 1

## 2019-08-18 MED ORDER — EZETIMIBE 10 MG PO TABS
10.0000 mg | ORAL_TABLET | Freq: Every day | ORAL | Status: DC
  Administered 2019-08-18 – 2019-08-22 (×5): 10 mg via ORAL
  Filled 2019-08-18 (×7): qty 1

## 2019-08-18 MED ORDER — MEMANTINE HCL 5 MG PO TABS
5.0000 mg | ORAL_TABLET | Freq: Every day | ORAL | Status: DC
  Administered 2019-08-18 – 2019-08-22 (×5): 5 mg via ORAL
  Filled 2019-08-18 (×7): qty 1

## 2019-08-18 MED ORDER — OLOPATADINE HCL 0.1 % OP SOLN
1.0000 [drp] | Freq: Two times a day (BID) | OPHTHALMIC | Status: DC
  Administered 2019-08-18: 1 [drp] via OPHTHALMIC
  Filled 2019-08-18: qty 5

## 2019-08-18 MED ORDER — LEVOTHYROXINE SODIUM 75 MCG PO TABS: 37.5000 ug | ORAL_TABLET | Freq: Every day | ORAL | Status: DC

## 2019-08-18 MED ORDER — ACETAMINOPHEN 650 MG RE SUPP: 650.0000 mg | Freq: Four times a day (QID) | RECTAL | Status: DC | PRN

## 2019-08-18 MED ORDER — LEVOTHYROXINE SODIUM 75 MCG PO TABS
37.5000 ug | ORAL_TABLET | Freq: Every day | ORAL | Status: DC
  Administered 2019-08-19 – 2019-08-22 (×4): 37.5 ug via ORAL
  Filled 2019-08-18 (×3): qty 1

## 2019-08-18 MED ORDER — ACETAMINOPHEN 325 MG PO TABS: 650.0000 mg | ORAL_TABLET | Freq: Four times a day (QID) | ORAL | Status: DC | PRN

## 2019-08-18 MED ORDER — ONDANSETRON HCL 4 MG PO TABS: 4.0000 mg | ORAL_TABLET | Freq: Four times a day (QID) | ORAL | Status: DC | PRN

## 2019-08-18 NOTE — Telephone Encounter (Signed)
RN CM ED: Manuela Schwartz @ [phone] call to [facility] [phone] for clarification of home health services available. Georgina Snell confirmed patient's husband was active with [facility] and not this patient.

## 2019-08-18 NOTE — ED Notes (Signed)
Lunch Tray Ordered @ 1143. 

## 2019-08-18 NOTE — Progress Notes (Signed)
Patients son and husband are both on the floor.  Son has been visiting and updated on patients condition.  Son has been given a list of the medications prescribed to his mother per his request.

## 2019-08-18 NOTE — H&P (Signed)
History and Physical    Kellie Escobar WPY:[id number] DOB: 1941/07/03 DOA: 08/17/2019  PCP: Simone Curia, MD  Patient coming from: Home.  History obtained from ER physician.  Patient is demented.  Patient's son is also getting hospitalized for Covid pneumonia.  Chief Complaint: Covid symptoms.  HPI: Kellie Escobar is a 78 y.o. female with history of advanced dementia, stroke, hypertension had come to the ER on October 16 with chills and cough and diarrhea.  At that time patient was diagnosed with COVID-19 and discharged home.  Now that patient's son is also having similar symptoms and usually takes care of patient and her husband was unable to do so and patient was brought to the ER along with her husband.  Husband also is COVID-19 positive.  Since visiting on August 11, 2019 no new changes.  Patient has been also generally weak.  ED Course: In the ER patient was not hypoxic labs do show mild hyponatremia 133 creatinine 0.9 WBC 7 hemoglobin 13.3 platelets 280 lactate was 2 chest x-ray shows infiltrates concerning for COVID-19 pneumonia.  EKG shows normal sinus rhythm.  Patient is otherwise alert awake.  Patient admitted for further observation.  Review of Systems: As per HPI, rest all negative.   Past Medical History:  Diagnosis Date  . Dementia (HCC)   . History of loop recorder   . Hypertension   . Stroke [facility])     History reviewed. No pertinent surgical history.   reports that she has never smoked. She has never used smokeless tobacco. She reports that she does not drink alcohol or use drugs.  Allergies  Allergen Reactions  . Codeine Nausea And Vomiting  . Statins Other (See Comments)    unknown  . Sulfa Antibiotics Hives  . Sulfamethoxazole Hives  . Penicillins Nausea Only, Rash and Nausea And Vomiting    Family History  Family history unknown: Yes    Prior to Admission medications   Medication Sig Start Date End Date Taking? Authorizing Provider  ALPRAZolam Prudy Feeler)  0.5 MG tablet Take 0.5 mg by mouth 4 (four) times daily.     [provider]  amLODipine (NORVASC) 5 MG tablet Take 5 mg by mouth daily.    [provider]  clopidogrel (PLAVIX) 75 MG tablet Take 75 mg by mouth daily.    [provider]  docusate sodium (COLACE) 100 MG capsule Take 100 mg by mouth daily as needed for mild constipation.    [provider]  donepezil (ARICEPT) 10 MG tablet Take 10 mg by mouth at bedtime.    [provider]  ezetimibe (ZETIA) 10 MG tablet Take 10 mg by mouth daily.    [provider]  fluticasone (FLONASE) 50 MCG/ACT nasal spray Place 1 spray into both nostrils daily as needed for allergies or rhinitis.    [provider]  levothyroxine (SYNTHROID) 25 MCG tablet Take 37.5 mcg by mouth daily before breakfast.    [provider]  losartan (COZAAR) 100 MG tablet Take 100 mg by mouth daily.    [provider]  memantine (NAMENDA) 5 MG tablet Take 5 mg by mouth daily.    [provider]  Multiple Vitamins-Minerals (ALIVE ONCE DAILY WOMENS 50+ PO) Take 1 tablet by mouth daily.    [provider]  Olopatadine HCl (PATADAY) 0.2 % SOLN Apply 1 drop to eye daily as needed (allergies).     [provider]  ondansetron (ZOFRAN) 4 MG tablet Take 4 mg  by mouth every 8 (eight) hours as needed for nausea or vomiting.    [provider]  pantoprazole (PROTONIX) 40 MG tablet Take 40 mg by mouth 2 (two) times daily.    [provider]  potassium chloride SA (K-DUR) 20 MEQ tablet Take 20 mEq by mouth daily.    [provider]  pramipexole (MIRAPEX) 0.5 MG tablet Take 0.5 mg by mouth at bedtime.    [provider]  sertraline (ZOLOFT) 50 MG tablet Take 150 mg by mouth daily.    [provider]  solifenacin (VESICARE) 5 MG tablet Take 5 mg by mouth daily.    [provider]    Physical Exam: Constitutional: Moderately built and  nourished. Vitals:   08/18/19 0215 08/18/19 0230 08/18/19 0245 08/18/19 0300  BP: (!) 145/69 130/85 136/81 (!) 143/73  Pulse:  83 79   Resp:      Temp:      TempSrc:      SpO2:  93% 94%    Eyes: Nonicteric no pallor. ENMT: No discharge from the ears eyes nose or mouth. Neck: No mass felt.  No neck rigidity. Respiratory: No rhonchi or crepitations. Cardiovascular: S1-S2 heard. Abdomen: Soft nontender bowel sound present. Musculoskeletal: No edema. Skin: No rash. Neurologic: Alert awake follows commands moves all extremities. Psychiatric: Has dementia.   Labs on Admission: I have personally reviewed following labs and imaging studies  CBC: Recent Labs  Lab 08/11/19 1814 08/17/19 2005  WBC 5.1 7.0  NEUTROABS  --  5.0  HGB 13.1 13.3  HCT 41.1 41.9  MCV 86.9 85.9  PLT 244 626   Basic Metabolic Panel: Recent Labs  Lab 08/11/19 1814 08/11/19 1837 08/17/19 2005  NA 133*  --  133*  K 3.1*  --  3.7  CL 98  --  101  CO2 24  --  20*  GLUCOSE 105*  --  142*  BUN 11  --  13  CREATININE 0.98  --  0.91  CALCIUM 8.5*  --  9.2  MG  --  2.1  --    GFR: CrCl cannot be calculated (Unknown ideal weight.). Liver Function Tests: Recent Labs  Lab 08/11/19 1814 08/17/19 2005  AST 34 28  ALT 38 22  ALKPHOS 75 89  BILITOT 0.5 0.8  PROT 6.8 7.3  ALBUMIN 3.5 3.7   Recent Labs  Lab 08/11/19 1814  LIPASE 37   No results for input(s): AMMONIA in the last 168 hours. Coagulation Profile: No results for input(s): INR, PROTIME in the last 168 hours. Cardiac Enzymes: No results for input(s): CKTOTAL, CKMB, CKMBINDEX, TROPONINI in the last 168 hours. BNP (last 3 results) No results for input(s): PROBNP in the last 8760 hours. HbA1C: No results for input(s): HGBA1C in the last 72 hours. CBG: No results for input(s): GLUCAP in the last 168 hours. Lipid Profile: No results for input(s): CHOL, HDL, LDLCALC, TRIG, CHOLHDL, LDLDIRECT in the last 72 hours. Thyroid Function  Tests: No results for input(s): TSH, T4TOTAL, FREET4, T3FREE, THYROIDAB in the last 72 hours. Anemia Panel: No results for input(s): VITAMINB12, FOLATE, FERRITIN, TIBC, IRON, RETICCTPCT in the last 72 hours. Urine analysis:    Component Value Date/Time   COLORURINE YELLOW 05/29/2019 2339   APPEARANCEUR HAZY (A) 05/29/2019 2339   LABSPEC 1.019 05/29/2019 2339   PHURINE 6.0 05/29/2019 2339   GLUCOSEU NEGATIVE 05/29/2019 2339   HGBUR NEGATIVE 05/29/2019 [place] NEGATIVE 05/29/2019 [place] 05/29/2019 2339  PROTEINUR NEGATIVE 05/29/2019 2339   NITRITE NEGATIVE 05/29/2019 2339   LEUKOCYTESUR SMALL (A) 05/29/2019 2339   Sepsis Labs: @LABRCNTIP (procalcitonin:4,lacticidven:4) ) Recent Results (from the past 240 hour(s))  Novel Coronavirus, NAA (Hosp order, Send-out to Ref Lab; TAT 18-24 hrs     Status: Abnormal   Collection Time: 08/11/19  8:20 PM   Specimen: Nasopharyngeal Swab; Respiratory  Result Value Ref Range Status   SARS-CoV-2, NAA DETECTED (A) NOT DETECTED Final    Comment: RESULT CALLED TO, READ BACK BY AND VERIFIED WITH: RN L BERDIK 1558 [id number] FCP (NOTE)                  Client Requested Flag This nucleic acid amplification test was developed and its performance characteristics determined by [facility]. Nucleic acid amplification tests include PCR and TMA. This test has not been FDA cleared or approved. This test has been authorized by FDA under an Emergency Use Authorization (EUA). This test is only authorized for the duration of time the declaration that circumstances exist justifying the authorization of the emergency use of in vitro diagnostic tests for detection of SARS-CoV-2 virus and/or diagnosis of COVID-19 infection under section 564(b)(1) of the Act, 21 U.S.C. 161WRU-0(A360bbb-3(b) (1), unless the authorization is terminated or revoked sooner. When diagnostic testing is negative, the possibility of a false negative result should  be considered in the context of a patient's recent exposures and the presence of clinical signs an d symptoms consistent with COVID-19. An individual without symptoms of COVID- 19 and who is not shedding SARS-CoV-2 virus would expect to have a negative (not detected) result in this assay. Performed At: [facility]  [address] [place], [place] [id number] Jolene SchimkeNagendra Sanjai MD YN:[id number]:[phone]    Coronavirus Source NASOPHARYNGEAL  Final    Comment: Performed at [facility], [address] [place], [place] [address]     Radiological Exams on Admission: Dg Chest Port 1 View  Result Date: 08/18/2019 CLINICAL DATA:  COVID-19 positive. EXAM: PORTABLE CHEST 1 VIEW COMPARISON:  08/11/2019 FINDINGS: Worsening heterogeneous opacities in the right mid lower lung zone. Unchanged heart size and mediastinal contours with retrocardiac hiatal hernia. No pleural fluid or pneumothorax. Loop recorder projects over the left chest. IMPRESSION: 1. Worsening heterogeneous opacities in the right mid lower lung zone, consistent with COVID-19 pneumonia. 2. Hiatal hernia. Electronically Signed   By: Narda RutherfordMelanie  Sanford M.D.   On: 08/18/2019 02:05    EKG: Independently reviewed.  Normal sinus rhythm.  Assessment/Plan Principal Problem:   COVID-19 Active Problems:   Essential hypertension   History of stroke   Hypothyroidism   Dementia (HCC)   Pneumonia due to COVID-19 virus    1. COVID-19 pneumonia -presently not hypoxic so we will continue to monitor.  Check including markers. 2. Denies weakness from Covid pneumonia. 3. Hypertension on amlodipine and ARB. 4. Hypothyroidism on Synthroid. 5. Dementia on Namenda and Aricept. 6. History of stroke on Plavix and Zetia. 7. History of depression and anxiety on Zoloft and Xanax.  Patient son also has been admitted.  Need to reconfirm CODE STATUS with patient's son when available.  ER physician I discussed with patient's son who had at this time  advised no CPR.   DVT prophylaxis: Lovenox. Code Status: DNR was discussed by ER physician with patient's son. Family Communication: ER physician discussed with patient's son. Disposition Plan: May need rehab. Consults called: Child psychotherapistocial worker and physical therapy. Admission status: Observation.   Eduard ClosArshad N Meeghan Skipper MD [facility] Pager  [phone].  If 7PM-7AM, please contact night-coverage [email or web address] Password [email or web address]  08/18/2019, 3:07 AM

## 2019-08-18 NOTE — ED Notes (Signed)
Pt states had nice nap set up for lunch, labs drawn ,

## 2019-08-18 NOTE — Plan of Care (Signed)
  Problem: Education: Goal: Knowledge of risk factors and measures for prevention of condition will improve Outcome: Progressing   Problem: Coping: Goal: Psychosocial and spiritual needs will be supported Outcome: Progressing   Problem: Respiratory: Goal: Will maintain a patent airway Outcome: Progressing Goal: Complications related to the disease process, condition or treatment will be avoided or minimized Outcome: Progressing   

## 2019-08-18 NOTE — ED Notes (Signed)
Pt sitting up eating breakfast tray 

## 2019-08-18 NOTE — ED Notes (Signed)
[facility] called/already on list for pickup to [facility].

## 2019-08-18 NOTE — Plan of Care (Signed)
Patient arrived with books, shoes. Pants, blouse, briefs, purse, glasses, jacket. Pt stated she did not have debit card, only change. We verified together belongings in purse.

## 2019-08-18 NOTE — Progress Notes (Addendum)
Patient ID: Kellie Escobar, female   DOB: 1940-12-05, 78 y.o.   MRN: [id number] Patient was admitted early this morning for COVID-19 pneumonia. I have reviewed patient's medical records including this morning's H&P, current labs and medications.  I have spoken to the patient on phone; she feels weak but denies any worsening cough or shortness of breath.  Currently on 2 L nasal cannula oxygen.  Continue current treatment. Monitor labs. Transfer to [facility] once bed is available.

## 2019-08-18 NOTE — ED Notes (Signed)
[facility]  Breakfast ordered

## 2019-08-19 LAB — CBC
HCT: 36.4 % (ref 36.0–46.0)
Hemoglobin: 12 g/dL (ref 12.0–15.0)
MCH: 27.8 pg (ref 26.0–34.0)
MCHC: 33 g/dL (ref 30.0–36.0)
MCV: 84.3 fL (ref 80.0–100.0)
Platelets: 227 10*3/uL (ref 150–400)
RBC: 4.32 MIL/uL (ref 3.87–5.11)
RDW: 14.2 % (ref 11.5–15.5)
WBC: 5.5 10*3/uL (ref 4.0–10.5)
nRBC: 0 % (ref 0.0–0.2)

## 2019-08-19 LAB — COMPREHENSIVE METABOLIC PANEL
ALT: 25 U/L (ref 0–44)
AST: 25 U/L (ref 15–41)
Albumin: 3.4 g/dL — ABNORMAL LOW (ref 3.5–5.0)
Alkaline Phosphatase: 70 U/L (ref 38–126)
Anion gap: 10 (ref 5–15)
BUN: 27 mg/dL — ABNORMAL HIGH (ref 8–23)
CO2: 25 mmol/L (ref 22–32)
Calcium: 9 mg/dL (ref 8.9–10.3)
Chloride: 100 mmol/L (ref 98–111)
Creatinine, Ser: 0.84 mg/dL (ref 0.44–1.00)
GFR calc Af Amer: >60 mL/min (ref >60)
GFR calc non Af Amer: >60 mL/min (ref >60)
Glucose, Bld: 115 mg/dL — ABNORMAL HIGH (ref 70–99)
Potassium: 3.9 mmol/L (ref 3.5–5.1)
Sodium: 135 mmol/L (ref 135–145)
Total Bilirubin: 0.8 mg/dL (ref 0.3–1.2)
Total Protein: 6.6 g/dL (ref 6.5–8.1)

## 2019-08-19 LAB — GLUCOSE, CAPILLARY
Glucose-Capillary: 96 mg/dL (ref 70–99)
Glucose-Capillary: 108 mg/dL — ABNORMAL HIGH (ref 70–99)
Glucose-Capillary: 151 mg/dL — ABNORMAL HIGH (ref 70–99)
Glucose-Capillary: 107 mg/dL — ABNORMAL HIGH (ref 70–99)

## 2019-08-19 LAB — D-DIMER, QUANTITATIVE: D-Dimer, Quant: 0.41 ug/mL-FEU (ref 0.00–0.50)

## 2019-08-19 LAB — C-REACTIVE PROTEIN: CRP: 2.2 mg/dL — ABNORMAL HIGH (ref <1.0)

## 2019-08-19 NOTE — TOC Initial Note (Addendum)
Transition of Care [facility]) - Initial/Assessment Note    Patient Details  Name: Kellie Escobar MRN: [id number] Date of Birth: [birth year]  Transition of Care [facility]) CM/SW Contact:    Ninfa Meeker, RN Phone Number: [phone] (working remotely) 08/19/2019, 1:51 PM  Clinical Narrative:  Patient  is a 78 y.o. female with history of advanced dementia, stroke, hypertension had come to the ER on October 16 with chills and cough and diarrhea.  At that time patient was diagnosed with COVID-19 and discharged home.  Now that patient's son is also having similar symptoms and usually takes care of patient and her husband was unable to do so and patient was brought to the ER along with her husband, Gwyndolyn Saxon.  Husband also is COVID-19 positive, both have been admitted for COVID treatment.Husband in room 9149, son, Chrissie Noa, who is care giver for both parents is in Room 132. Patient may need shortterm SNF rehab, CM will continue to monitor.                    Patient Goals and CMS Choice        Expected Discharge Plan and Services                                                Prior Living Arrangements/Services                       Activities of Daily Living Home Assistive Devices/Equipment: Eyeglasses ADL Screening (condition at time of admission) Patient's cognitive ability adequate to safely complete daily activities?: Yes Is the patient deaf or have difficulty hearing?: Yes Does the patient have difficulty seeing, even when wearing glasses/contacts?: No Does the patient have difficulty concentrating, remembering, or making decisions?: Yes Patient able to express need for assistance with ADLs?: Yes Does the patient have difficulty dressing or bathing?: Yes Independently performs ADLs?: No Communication: Independent Dressing (OT): Needs assistance Is this a change from baseline?: Pre-admission baseline Grooming: Needs assistance Is this a change from baseline?:  Pre-admission baseline Feeding: Independent Bathing: Needs assistance Is this a change from baseline?: Pre-admission baseline Toileting: Needs assistance Is this a change from baseline?: Pre-admission baseline In/Out Bed: Needs assistance Is this a change from baseline?: Pre-admission baseline Does the patient have difficulty walking or climbing stairs?: Yes Weakness of Legs: None Weakness of Arms/Hands: None  Permission Sought/Granted                  Emotional Assessment              Admission diagnosis:  COVID-19 virus infection [U07.1] COVID-19 [U07.1] Pneumonia due to COVID-19 virus [U07.1, J12.89] Patient Active Problem List   Diagnosis Date Noted  . COVID-19 08/18/2019  . Essential hypertension 08/18/2019  . History of stroke 08/18/2019  . Hypothyroidism 08/18/2019  . Dementia ([place]) 08/18/2019  . Pneumonia due to COVID-19 virus 08/18/2019   PCP:  System, Pcp Not In Pharmacy:   [facility] [address] [place] - [place] [address] [place] [address] Phone: [phone] Fax: [phone]  [id number] [place], [place] [address] [place] [address] Phone: [phone] Fax: [phone]     Social Determinants of Health (SDOH) Interventions    Readmission Risk Interventions  No flowsheet data found.

## 2019-08-19 NOTE — Progress Notes (Signed)
Progress Note   Kellie Escobar DZH:[id number] DOB: Aug 07, 1941 DOA: 08/17/2019  PCP: System, Pcp Not In  Patient coming from: Home.  Chief Complaint: Covid symptoms.  HPI: Kellie Escobar is a 78 y.o. female with history of advanced dementia, stroke, hypertension had come to the ER on October 16 with chills and cough and diarrhea.  At that time patient was diagnosed with COVID-19 and discharged home.  Now that patient's son is also having similar symptoms and usually takes care of patient and her husband was unable to do so and patient was brought to the ER along with her husband.  Husband also is COVID-19 positive.  Since visiting on August 11, 2019 no new changes.  Patient has been also generally weak. In the ER patient was not hypoxic labs do show mild hyponatremia 133 creatinine 0.9 WBC 7 hemoglobin 13.3 platelets 280 lactate was 2 chest x-ray shows infiltrates concerning for COVID-19 pneumonia.  EKG shows normal sinus rhythm.  Patient is otherwise alert awake.  Patient admitted for further observation.  Subjective: No issues or events overnight, patient's history is somewhat limited given her baseline dementia.  Patient states she feels tired and short of breath but otherwise declines nausea, vomiting, diarrhea, constipation, headache, fevers, chills.   Assessment/Plan Principal Problem:   COVID-19 Active Problems:   Essential hypertension   History of stroke   Hypothyroidism   Dementia (HCC)   Pneumonia due to COVID-19 virus   Acute hypoxic respiratory failure in the setting of COVID-19 pneumonia -Admitted requiring nasal cannula oxygen at rest well above patient's baseline oxygen needs -room air - Covid 19 Positive on 10/16 -Continue remdesivir, dexamethasone per protocol -Currently no indication for Actemra or plasma given CRP low, patient has minimal symptoms and appears to be quite stable; certainly would consider if patient clinically worsened or CRP or inflammatory markers  began to rise SpO2: 98 % O2 Flow Rate (L/min): 2 L/min Recent Labs    08/18/19 1411 08/19/19 0145  DDIMER  --  0.41  FERRITIN 107  --   CRP 2.0* 2.2*   Hypertension, essential, well-controlled Continue home medication amlodipine and ARB  CVA, history of Continue Plavix, statin; follow-up for core measures  Hypothyroidism Continue Synthroid  Dementia, unspecified Continue Namenda and Aricept  Anxiety/Depression Continue Zoloft and Xanax.    DVT prophylaxis: Lovenox. Code Status: DNR confirmed by ER physician with patient's son. Family Communication: Son currently hospitalized as well Disposition Plan: Pending clinical status Consults called: Education officer, museum and physical therapy. Admission status: Inpatient -patient continues to be hypoxic, requiring IV remdesivir and steroids in the setting of COVID-19 pneumonia.  Suspect at minimal 5 to 7 days of inpatient therapy given patient's age, typical clinical course and ongoing hypoxia.  Physical Exam: Constitutional: Moderately built and nourished. Vitals:   08/18/19 1949 08/19/19 0351 08/19/19 0645 08/19/19 0837  BP:  (!) 145/89  (!) 160/77  Pulse:    71  Resp: 16 15  (!) 22  Temp: 98.7 F (37.1 C) 97.7 F (36.5 C)  98.9 F (37.2 C)  TempSrc: Oral Oral  Oral  SpO2:  96%  98%  Weight:   49.5 kg   Height:       General:  Pleasantly demented; resting in bed, No acute distress.  HEENT:  Normocephalic atraumatic.  Sclerae nonicteric, noninjected.  Extraocular movements intact bilaterally. Neck:  Without mass or deformity.  Trachea is midline. Lungs:  Clear to auscultate bilaterally without rhonchi, wheeze, or rales. Heart:  Regular  rate and rhythm.  Without murmurs, rubs, or gallops. Abdomen:  Soft, nontender, nondistended.  Without guarding or rebound. Extremities: Without cyanosis, clubbing, edema, or obvious deformity. Vascular:  Dorsalis pedis and posterior tibial pulses palpable bilaterally. Skin:  Warm and dry, no  erythema, no ulcerations.   Labs on Admission: I have personally reviewed following labs and imaging studies  CBC: Recent Labs  Lab 08/17/19 2005 08/18/19 1411 08/19/19 0145  WBC 7.0 5.6 5.5  NEUTROABS 5.0 4.8  --   HGB 13.3 13.5 12.0  HCT 41.9 40.9 36.4  MCV 85.9 85.6 84.3  PLT 280 261 227   Basic Metabolic Panel: Recent Labs  Lab 08/17/19 2005 08/18/19 1411 08/19/19 0145  NA 133* 138 135  K 3.7 4.7 3.9  CL 101 104 100  CO2 20* 24 25  GLUCOSE 142* 168* 115*  BUN 13 15 27*  CREATININE 0.91 0.94 0.84  CALCIUM 9.2 9.1 9.0   GFR: Estimated Creatinine Clearance: 38.3 mL/min (by C-G formula based on SCr of 0.84 mg/dL). Liver Function Tests: Recent Labs  Lab 08/17/19 2005 08/18/19 1411 08/19/19 0145  AST 28 34 25  ALT 22 26 25   ALKPHOS 89 86 70  BILITOT 0.8 0.4 0.8  PROT 7.3 7.1 6.6  ALBUMIN 3.7 3.5 3.4*   No results for input(s): LIPASE, AMYLASE in the last 168 hours. No results for input(s): AMMONIA in the last 168 hours. Coagulation Profile: No results for input(s): INR, PROTIME in the last 168 hours. Cardiac Enzymes: No results for input(s): CKTOTAL, CKMB, CKMBINDEX, TROPONINI in the last 168 hours. BNP (last 3 results) No results for input(s): PROBNP in the last 8760 hours. HbA1C: No results for input(s): HGBA1C in the last 72 hours. CBG: Recent Labs  Lab 08/18/19 0827 08/18/19 1701 08/18/19 2307 08/19/19 0637  GLUCAP 159* 176* 96 108*   Lipid Profile: No results for input(s): CHOL, HDL, LDLCALC, TRIG, CHOLHDL, LDLDIRECT in the last 72 hours. Thyroid Function Tests: No results for input(s): TSH, T4TOTAL, FREET4, T3FREE, THYROIDAB in the last 72 hours. Anemia Panel: Recent Labs    08/18/19 1411  FERRITIN 107   Urine analysis:    Component Value Date/Time   COLORURINE YELLOW 05/29/2019 2339   APPEARANCEUR HAZY (A) 05/29/2019 2339   LABSPEC 1.019 05/29/2019 2339   PHURINE 6.0 05/29/2019 2339   GLUCOSEU NEGATIVE 05/29/2019 2339   HGBUR  NEGATIVE 05/29/2019 2339   BILIRUBINUR NEGATIVE 05/29/2019 2339   KETONESUR NEGATIVE 05/29/2019 2339   PROTEINUR NEGATIVE 05/29/2019 2339   NITRITE NEGATIVE 05/29/2019 2339   LEUKOCYTESUR SMALL (A) 05/29/2019 2339    Recent Results (from the past 240 hour(s))  Novel Coronavirus, NAA (Hosp order, Send-out to Ref Lab; TAT 18-24 hrs     Status: Abnormal   Collection Time: 08/11/19  8:20 PM   Specimen: Nasopharyngeal Swab; Respiratory  Result Value Ref Range Status   SARS-CoV-2, NAA DETECTED (A) NOT DETECTED Final    Comment: RESULT CALLED TO, READ BACK BY AND VERIFIED WITH: RN L BERDIK 1558 [id number] FCP (NOTE)                  Client Requested Flag This nucleic acid amplification test was developed and its performance characteristics determined by [facility]. Nucleic acid amplification tests include PCR and TMA. This test has not been FDA cleared or approved. This test has been authorized by FDA under an Emergency Use Authorization (EUA). This test is only authorized for the duration of time the declaration that circumstances exist  justifying the authorization of the emergency use of in vitro diagnostic tests for detection of SARS-CoV-2 virus and/or diagnosis of COVID-19 infection under section 564(b)(1) of the Act, 21 U.S.C. 035KKX-3(G) (1), unless the authorization is terminated or revoked sooner. When diagnostic testing is negative, the possibility of a false negative result should be considered in the context of a patient's recent exposures and the presence of clinical signs an d symptoms consistent with COVID-19. An individual without symptoms of COVID- 19 and who is not shedding SARS-CoV-2 virus would expect to have a negative (not detected) result in this assay. Performed At: [facility] [address] [place], [place] [id number] Jolene Schimke MD RC:[id number]    Coronavirus Source NASOPHARYNGEAL  Final    Comment: Performed at [facility],  [address], [place], [place] [address]     Radiological Exams on Admission: Dg Chest Port 1 View  Result Date: 08/18/2019 CLINICAL DATA:  COVID-19 positive. EXAM: PORTABLE CHEST 1 VIEW COMPARISON:  08/11/2019 FINDINGS: Worsening heterogeneous opacities in the right mid lower lung zone. Unchanged heart size and mediastinal contours with retrocardiac hiatal hernia. No pleural fluid or pneumothorax. Loop recorder projects over the left chest. IMPRESSION: 1. Worsening heterogeneous opacities in the right mid lower lung zone, consistent with COVID-19 pneumonia. 2. Hiatal hernia. Electronically Signed   By: Narda Rutherford M.D.   On: 08/18/2019 02:05    EKG: Independently reviewed.  Normal sinus rhythm.  Azucena Fallen DO [facility]  If 7PM-7AM, please contact night-coverage [email or web address] Password [email or web address]  08/19/2019, 11:50 AM

## 2019-08-20 LAB — CBC
HCT: 37.4 % (ref 36.0–46.0)
Hemoglobin: 12.2 g/dL (ref 12.0–15.0)
MCH: 27.9 pg (ref 26.0–34.0)
MCHC: 32.6 g/dL (ref 30.0–36.0)
MCV: 85.4 fL (ref 80.0–100.0)
Platelets: 249 10*3/uL (ref 150–400)
RBC: 4.38 MIL/uL (ref 3.87–5.11)
RDW: 14.3 % (ref 11.5–15.5)
WBC: 8.2 10*3/uL (ref 4.0–10.5)
nRBC: 0 % (ref 0.0–0.2)

## 2019-08-20 LAB — COMPREHENSIVE METABOLIC PANEL
ALT: 29 U/L (ref 0–44)
AST: 27 U/L (ref 15–41)
Albumin: 3.4 g/dL — ABNORMAL LOW (ref 3.5–5.0)
Alkaline Phosphatase: 76 U/L (ref 38–126)
Anion gap: 10 (ref 5–15)
BUN: 27 mg/dL — ABNORMAL HIGH (ref 8–23)
CO2: 24 mmol/L (ref 22–32)
Calcium: 8.9 mg/dL (ref 8.9–10.3)
Chloride: 102 mmol/L (ref 98–111)
Creatinine, Ser: 0.85 mg/dL (ref 0.44–1.00)
GFR calc Af Amer: >60 mL/min (ref >60)
GFR calc non Af Amer: >60 mL/min (ref >60)
Glucose, Bld: 111 mg/dL — ABNORMAL HIGH (ref 70–99)
Potassium: 4.2 mmol/L (ref 3.5–5.1)
Sodium: 136 mmol/L (ref 135–145)
Total Bilirubin: 0.5 mg/dL (ref 0.3–1.2)
Total Protein: 6.6 g/dL (ref 6.5–8.1)

## 2019-08-20 LAB — D-DIMER, QUANTITATIVE: D-Dimer, Quant: 0.48 ug/mL-FEU (ref 0.00–0.50)

## 2019-08-20 LAB — GLUCOSE, CAPILLARY
Glucose-Capillary: 117 mg/dL — ABNORMAL HIGH (ref 70–99)
Glucose-Capillary: 110 mg/dL — ABNORMAL HIGH (ref 70–99)
Glucose-Capillary: 178 mg/dL — ABNORMAL HIGH (ref 70–99)

## 2019-08-20 LAB — C-REACTIVE PROTEIN: CRP: 1.7 mg/dL — ABNORMAL HIGH (ref <1.0)

## 2019-08-20 NOTE — Progress Notes (Signed)
Pt family updated Chrissie Noa) son who is in the hospital here at [facility] was updated & all questions/concerns addressed

## 2019-08-20 NOTE — Progress Notes (Addendum)
Progress Note   Kellie PaliBetty Lou Thien OZH:[id number] DOB: 27-Jul-1941 DOA: 08/17/2019  PCP: System, Pcp Not In  Patient coming from: Home.  Chief Complaint: Covid symptoms.  HPI: Kellie Escobar is a 78 y.o. female with history of advanced dementia, stroke, hypertension had come to the ER on October 16 with chills and cough and diarrhea.  At that time patient was diagnosed with COVID-19 and discharged home.  Now that patient's son is also having similar symptoms and usually takes care of patient and her husband was unable to do so and patient was brought to the ER along with her husband.  Husband also is COVID-19 positive.  Since visiting on August 11, 2019 no new changes.  Patient has been also generally weak. In the ER patient was not hypoxic labs do show mild hyponatremia 133 creatinine 0.9 WBC 7 hemoglobin 13.3 platelets 280 lactate was 2 chest x-ray shows infiltrates concerning for COVID-19 pneumonia.  EKG shows normal sinus rhythm.  Patient is otherwise alert awake.  Patient admitted for further observation.  Subjective: No issues or events overnight, patient's history is limited given her baseline dementia.  Patient states she continues to feel tired and short of breath but otherwise declines nausea, vomiting, diarrhea, constipation, headache, fevers, chills.   Assessment/Plan Principal Problem:   COVID-19 Active Problems:   Essential hypertension   History of stroke   Hypothyroidism   Dementia (HCC)   Pneumonia due to COVID-19 virus   Acute hypoxic respiratory failure in the setting of COVID-19 pneumonia, POA, resolving -Admitted requiring nasal cannula oxygen at rest well above patient's baseline oxygen needs -room air - Covid 19 Positive on 10/16 -Continue remdesivir (stop date 10/27), dexamethasone(stop date 11/1) per protocol -Currently no indication for Actemra or plasma given CRP low, patient has minimal symptoms and appears to be quite stable; certainly would consider if patient  clinically worsened or CRP or inflammatory markers began to rise -Currently on room air - will continue to follow oxygen needs especially with exertion Recent Labs    08/18/19 1411 08/19/19 0145 08/20/19 0051  DDIMER  --  0.41 0.48  FERRITIN 107  --   --   CRP 2.0* 2.2* 1.7*   Hypertension, essential, well-controlled Continue home medication amlodipine and ARB  CVA, history of Continue Plavix, statin; follow-up for core measures  Hypothyroidism Continue Synthroid  Dementia, unspecified Continue Namenda and Aricept  Anxiety/Depression Continue Zoloft and Xanax.  DVT prophylaxis: Lovenox. Code Status: DNR confirmed by ER physician with patient's son. Family Communication: Son and Husband currently hospitalized as well Disposition Plan: Pending clinical status Consults called: Child psychotherapistocial worker and physical therapy. Admission status: Inpatient -patient continues to be hypoxic, requiring IV remdesivir and steroids in the setting of COVID-19 pneumonia.  Suspect at minimal 5 to 7 days of inpatient therapy given patient's age, typical clinical course and ongoing hypoxia.  Physical Exam: Constitutional: Moderately built and nourished. Vitals:   08/19/19 1526 08/19/19 1948 08/20/19 0342 08/20/19 0723  BP: (!) 129/59 (!) 150/66 (!) 147/70 (!) 152/81  Pulse: 74 87 76 69  Resp: 20 18  18   Temp: 98.3 F (36.8 C) 97.9 F (36.6 C) 98.2 F (36.8 C) 98.6 F (37 C)  TempSrc: Oral Oral Oral Oral  SpO2: 96% 98% 92% 92%  Weight:      Height:       General:  Pleasantly demented; sitting up in chair eating breakfast, no acute distress.  HEENT:  Normocephalic atraumatic.  Sclerae nonicteric, noninjected.  Extraocular movements  intact bilaterally. Neck:  Without mass or deformity.  Trachea is midline. Lungs:  Clear to auscultate bilaterally without rhonchi, wheeze, or rales. Heart:  Regular rate and rhythm.  Without murmurs, rubs, or gallops. Abdomen:  Soft, nontender, nondistended.   Without guarding or rebound. Extremities: Without cyanosis, clubbing, edema, or obvious deformity. Vascular:  Dorsalis pedis and posterior tibial pulses palpable bilaterally. Skin:  Warm and dry, no erythema, no ulcerations.   Labs on Admission: I have personally reviewed following labs and imaging studies  CBC: Recent Labs  Lab 08/17/19 2005 08/18/19 1411 08/19/19 0145 08/20/19 0051  WBC 7.0 5.6 5.5 8.2  NEUTROABS 5.0 4.8  --   --   HGB 13.3 13.5 12.0 12.2  HCT 41.9 40.9 36.4 37.4  MCV 85.9 85.6 84.3 85.4  PLT 280 261 227 735   Basic Metabolic Panel: Recent Labs  Lab 08/17/19 2005 08/18/19 1411 08/19/19 0145 08/20/19 0051  NA 133* 138 135 136  K 3.7 4.7 3.9 4.2  CL 101 104 100 102  CO2 20* 24 25 24   GLUCOSE 142* 168* 115* 111*  BUN 13 15 27* 27*  CREATININE 0.91 0.94 0.84 0.85  CALCIUM 9.2 9.1 9.0 8.9   GFR: Estimated Creatinine Clearance: 37.8 mL/min (by C-G formula based on SCr of 0.85 mg/dL). Liver Function Tests: Recent Labs  Lab 08/17/19 2005 08/18/19 1411 08/19/19 0145 08/20/19 0051  AST 28 34 25 27  ALT 22 26 25 29   ALKPHOS 89 86 70 76  BILITOT 0.8 0.4 0.8 0.5  PROT 7.3 7.1 6.6 6.6  ALBUMIN 3.7 3.5 3.4* 3.4*   No results for input(s): LIPASE, AMYLASE in the last 168 hours. No results for input(s): AMMONIA in the last 168 hours. Coagulation Profile: No results for input(s): INR, PROTIME in the last 168 hours. Cardiac Enzymes: No results for input(s): CKTOTAL, CKMB, CKMBINDEX, TROPONINI in the last 168 hours. BNP (last 3 results) No results for input(s): PROBNP in the last 8760 hours. HbA1C: No results for input(s): HGBA1C in the last 72 hours. CBG: Recent Labs  Lab 08/18/19 2307 08/19/19 0637 08/19/19 1127 08/19/19 2328 08/20/19 0628  GLUCAP 96 108* 151* 107* 117*   Lipid Profile: No results for input(s): CHOL, HDL, LDLCALC, TRIG, CHOLHDL, LDLDIRECT in the last 72 hours. Thyroid Function Tests: No results for input(s): TSH, T4TOTAL,  FREET4, T3FREE, THYROIDAB in the last 72 hours. Anemia Panel: Recent Labs    08/18/19 1411  FERRITIN 107   Urine analysis:    Component Value Date/Time   COLORURINE YELLOW 05/29/2019 2339   APPEARANCEUR HAZY (A) 05/29/2019 2339   LABSPEC 1.019 05/29/2019 2339   PHURINE 6.0 05/29/2019 2339   GLUCOSEU NEGATIVE 05/29/2019 2339   HGBUR NEGATIVE 05/29/2019 2339   BILIRUBINUR NEGATIVE 05/29/2019 2339   KETONESUR NEGATIVE 05/29/2019 2339   PROTEINUR NEGATIVE 05/29/2019 2339   NITRITE NEGATIVE 05/29/2019 2339   LEUKOCYTESUR SMALL (A) 05/29/2019 2339    Recent Results (from the past 240 hour(s))  Novel Coronavirus, NAA (Hosp order, Send-out to Ref Lab; TAT 18-24 hrs     Status: Abnormal   Collection Time: 08/11/19  8:20 PM   Specimen: Nasopharyngeal Swab; Respiratory  Result Value Ref Range Status   SARS-CoV-2, NAA DETECTED (A) NOT DETECTED Final    Comment: RESULT CALLED TO, READ BACK BY AND VERIFIED WITH: RN L BERDIK [id number] FCP (NOTE)                  Client Requested Flag This nucleic acid  amplification test was developed and its performance characteristics determined by [facility]. Nucleic acid amplification tests include PCR and TMA. This test has not been FDA cleared or approved. This test has been authorized by FDA under an Emergency Use Authorization (EUA). This test is only authorized for the duration of time the declaration that circumstances exist justifying the authorization of the emergency use of in vitro diagnostic tests for detection of SARS-CoV-2 virus and/or diagnosis of COVID-19 infection under section 564(b)(1) of the Act, 21 U.S.C. 220URK-2(H) (1), unless the authorization is terminated or revoked sooner. When diagnostic testing is negative, the possibility of a false negative result should be considered in the context of a patient's recent exposures and the presence of clinical signs an d symptoms consistent with COVID-19. An individual  without symptoms of COVID- 19 and who is not shedding SARS-CoV-2 virus would expect to have a negative (not detected) result in this assay. Performed At: [facility] [address] [place], [place] [id number] Jolene Schimke MD TD:[id number]    Coronavirus Source NASOPHARYNGEAL  Final    Comment: Performed at [facility], [address], [place], [place] [address]     Radiological Exams on Admission: No results found.  EKG: Independently reviewed.  Normal sinus rhythm.  Azucena Fallen DO [facility]  If 7PM-7AM, please contact night-coverage [email or web address] Password [email or web address]  08/20/2019, 10:25 AM

## 2019-08-20 NOTE — Progress Notes (Signed)
Son Chrissie Noa) and husband Gwyndolyn Saxon) updated via telephone on patient condition.

## 2019-08-21 LAB — CBC
HCT: 36.6 % (ref 36.0–46.0)
Hemoglobin: 11.9 g/dL — ABNORMAL LOW (ref 12.0–15.0)
MCH: 27.4 pg (ref 26.0–34.0)
MCHC: 32.5 g/dL (ref 30.0–36.0)
MCV: 84.3 fL (ref 80.0–100.0)
Platelets: 263 10*3/uL (ref 150–400)
RBC: 4.34 MIL/uL (ref 3.87–5.11)
RDW: 13.9 % (ref 11.5–15.5)
WBC: 9.5 10*3/uL (ref 4.0–10.5)
nRBC: 0 % (ref 0.0–0.2)

## 2019-08-21 LAB — GLUCOSE, CAPILLARY
Glucose-Capillary: 121 mg/dL — ABNORMAL HIGH (ref 70–99)
Glucose-Capillary: 145 mg/dL — ABNORMAL HIGH (ref 70–99)
Glucose-Capillary: 168 mg/dL — ABNORMAL HIGH (ref 70–99)
Glucose-Capillary: 176 mg/dL — ABNORMAL HIGH (ref 70–99)

## 2019-08-21 LAB — COMPREHENSIVE METABOLIC PANEL
ALT: 32 U/L (ref 0–44)
AST: 26 U/L (ref 15–41)
Albumin: 3.2 g/dL — ABNORMAL LOW (ref 3.5–5.0)
Alkaline Phosphatase: 71 U/L (ref 38–126)
Anion gap: 10 (ref 5–15)
BUN: 26 mg/dL — ABNORMAL HIGH (ref 8–23)
CO2: 26 mmol/L (ref 22–32)
Calcium: 9.2 mg/dL (ref 8.9–10.3)
Chloride: 100 mmol/L (ref 98–111)
Creatinine, Ser: 0.86 mg/dL (ref 0.44–1.00)
GFR calc Af Amer: >60 mL/min (ref >60)
GFR calc non Af Amer: >60 mL/min (ref >60)
Glucose, Bld: 124 mg/dL — ABNORMAL HIGH (ref 70–99)
Potassium: 3.7 mmol/L (ref 3.5–5.1)
Sodium: 136 mmol/L (ref 135–145)
Total Bilirubin: 0.4 mg/dL (ref 0.3–1.2)
Total Protein: 6.5 g/dL (ref 6.5–8.1)

## 2019-08-21 LAB — D-DIMER, QUANTITATIVE: D-Dimer, Quant: 0.38 ug/mL-FEU (ref 0.00–0.50)

## 2019-08-21 LAB — C-REACTIVE PROTEIN: CRP: 2.9 mg/dL — ABNORMAL HIGH (ref <1.0)

## 2019-08-21 MED ORDER — CEFDINIR 300 MG PO CAPS
600.0000 mg | ORAL_CAPSULE | ORAL | Status: DC
  Administered 2019-08-21 – 2019-08-22 (×2): 600 mg via ORAL
  Filled 2019-08-21 (×3): qty 2

## 2019-08-21 MED ORDER — CIPROFLOXACIN HCL 250 MG PO TABS
250.0000 mg | ORAL_TABLET | Freq: Two times a day (BID) | ORAL | Status: DC
  Filled 2019-08-21 (×4): qty 1

## 2019-08-21 NOTE — Progress Notes (Signed)
Pt son Chrissie Noa updated over phone. Husband came to bs to see pt this evening.  Pt was expressed she was having some burning w/ urination. UCX sent & PO omnicef started.  SQ lovenox for DVT prophylaxis IV Remdesivir  Bed in low locked position Call bell working & within reach.  Hourly safety checks on pt.  Pt food & fluid intake is wonderful  Anticipated d/c tomorrow afternoon?

## 2019-08-21 NOTE — Progress Notes (Signed)
Progress Note   Kellie Escobar CVE:[id number] DOB: [birth year] DOA: 08/17/2019  PCP: System, Pcp Not In  Patient coming from: Home.  Chief Complaint: Covid symptoms.  HPI: Kellie Escobar is a 78 y.o. female with history of advanced dementia, stroke, hypertension had come to the ER on October 16 with chills and cough and diarrhea.  At that time patient was diagnosed with COVID-19 and discharged home.  Now that patient's son is also having similar symptoms and usually takes care of patient and her husband was unable to do so and patient was brought to the ER along with her husband.  Husband also is COVID-19 positive.  Since visiting on August 11, 2019 no new changes.  Patient has been also generally weak. In the ER patient was not hypoxic labs do show mild hyponatremia 133 creatinine 0.9 WBC 7 hemoglobin 13.3 platelets 280 lactate was 2 chest x-ray shows infiltrates concerning for COVID-19 pneumonia.  EKG shows normal sinus rhythm.  Patient is otherwise alert awake.  Patient admitted for further observation.  Subjective: No acute issues or events overnight, patient complaining of burning with urination today, appears to be new onset for her.  Wise declines discharge, fevers, chills, headache, nausea, vomiting.   Assessment/Plan Principal Problem:   COVID-19 Active Problems:   Essential hypertension   History of stroke   Hypothyroidism   Dementia (HCC)   Pneumonia due to COVID-19 virus   Acute hypoxic respiratory failure in the setting of COVID-19 pneumonia, POA, resolving -Admitted requiring nasal cannula oxygen at rest well above patient's baseline oxygen needs -currently continues on room air - Covid 19 Positive on 10/16 -Continue remdesivir (stop date 10/27), dexamethasone (stop date 11/1) per protocol -Currently no indication for Actemra or plasma given CRP remains low, patient has minimal symptoms and appears to be quite stable; certainly would consider if patient clinically  worsened or CRP or inflammatory markers began to rise -Currently on room air - will continue to follow oxygen needs especially with exertion although patient is somewhat limited on ambulation Recent Labs    08/18/19 1411 08/19/19 0145 08/20/19 0051 08/21/19 0300  DDIMER  --  0.41 0.48  --   FERRITIN 107  --   --   --   CRP 2.0* 2.2* 1.7* 2.9*   Dysuria, cannot rule out UTI  Unclear if POA although UA was unremarkable Patient's baseline mental status precludes thorough review of systems but mentions 'it hurts when I pee sometimes' in passing when prompted this morning Will culture for specificity/sensitivities Start empiric cipro 250 mg PO bid x3 days  Hypertension, essential, moderaetly well-controlled Continue home medication amlodipine and losartan Somewhat elevated this am in passing - unclear if agitation -continue to follow  CVA, history of Continue Plavix, statin; follow-up for core measures  Hypothyroidism Continue Synthroid  Dementia, unspecified Continue Namenda and Aricept  Anxiety/Depression Continue Zoloft and Xanax.  DVT prophylaxis: Lovenox. Code Status: DNR confirmed by ER physician with patient's son. Family Communication: Son and Husband currently hospitalized here as well Disposition Plan: Pending clinical improvement - likely discharge in the next 24-48h pending safe disposition as son is primary caretaker and tentatively to be discharged in the next 24 to 48 hours as well.  Hoping to discharge this patient with her son to ensure safe transition home. Consults called: Education officer, museum and physical therapy. Admission status: Inpatient -patient continues to be hypoxic, requiring IV remdesivir and steroids in the setting of COVID-19 pneumonia.   Physical Exam: Constitutional: Moderately built  and nourished. Vitals:   08/20/19 1910 08/20/19 2040 08/21/19 0433 08/21/19 0721  BP:  (!) 151/78 (!) 160/76 (!) 183/87  Pulse: 86 86 67 67  Resp:  20 20 18   Temp:   98 F (36.7 C) 98.2 F (36.8 C) 98 F (36.7 C)  TempSrc:  Oral Oral Oral  SpO2: 96% 96% 94% 97%  Weight:      Height:       General:  Pleasantly demented; sitting up in chair eating breakfast, no acute distress.  HEENT:  Normocephalic atraumatic.  Sclerae nonicteric, noninjected.  Extraocular movements intact bilaterally. Neck:  Without mass or deformity.  Trachea is midline. Lungs:  Clear to auscultate bilaterally without rhonchi, wheeze, or rales. Heart:  Regular rate and rhythm.  Without murmurs, rubs, or gallops. Abdomen:  Soft, nontender, nondistended.  Without guarding or rebound. Extremities: Without cyanosis, clubbing, edema, or obvious deformity. Vascular:  Dorsalis pedis and posterior tibial pulses palpable bilaterally. Skin:  Warm and dry, no erythema, no ulcerations.   Labs on Admission: I have personally reviewed following labs and imaging studies  CBC: Recent Labs  Lab 08/17/19 2005 08/18/19 1411 08/19/19 0145 08/20/19 0051 08/21/19 0300  WBC 7.0 5.6 5.5 8.2 9.5  NEUTROABS 5.0 4.8  --   --   --   HGB 13.3 13.5 12.0 12.2 11.9*  HCT 41.9 40.9 36.4 37.4 36.6  MCV 85.9 85.6 84.3 85.4 84.3  PLT 280 261 227 249 263   Basic Metabolic Panel: Recent Labs  Lab 08/17/19 2005 08/18/19 1411 08/19/19 0145 08/20/19 0051 08/21/19 0300  NA 133* 138 135 136 136  K 3.7 4.7 3.9 4.2 3.7  CL 101 104 100 102 100  CO2 20* 24 25 24 26   GLUCOSE 142* 168* 115* 111* 124*  BUN 13 15 27* 27* 26*  CREATININE 0.91 0.94 0.84 0.85 0.86  CALCIUM 9.2 9.1 9.0 8.9 9.2   GFR: Estimated Creatinine Clearance: 37.4 mL/min (by C-G formula based on SCr of 0.86 mg/dL). Liver Function Tests: Recent Labs  Lab 08/17/19 2005 08/18/19 1411 08/19/19 0145 08/20/19 0051 08/21/19 0300  AST 28 34 25 27 26   ALT 22 26 25 29  32  ALKPHOS 89 86 70 76 71  BILITOT 0.8 0.4 0.8 0.5 0.4  PROT 7.3 7.1 6.6 6.6 6.5  ALBUMIN 3.7 3.5 3.4* 3.4* 3.2*   CBG: Recent Labs  Lab 08/20/19 0628 08/20/19  1127 08/20/19 1745 08/21/19 0036 08/21/19 0605  GLUCAP 117* 110* 178* 145* 121*   Urine analysis:    Component Value Date/Time   COLORURINE YELLOW 05/29/2019 2339   APPEARANCEUR HAZY (A) 05/29/2019 2339   LABSPEC 1.019 05/29/2019 2339   PHURINE 6.0 05/29/2019 2339   GLUCOSEU NEGATIVE 05/29/2019 2339   HGBUR NEGATIVE 05/29/2019 2339   BILIRUBINUR NEGATIVE 05/29/2019 2339   KETONESUR NEGATIVE 05/29/2019 2339   PROTEINUR NEGATIVE 05/29/2019 2339   NITRITE NEGATIVE 05/29/2019 2339   LEUKOCYTESUR SMALL (A) 05/29/2019 2339    Recent Results (from the past 240 hour(s))  Novel Coronavirus, NAA (Hosp order, Send-out to Ref Lab; TAT 18-24 hrs     Status: Abnormal   Collection Time: 08/11/19  8:20 PM   Specimen: Nasopharyngeal Swab; Respiratory  Result Value Ref Range Status   SARS-CoV-2, NAA DETECTED (A) NOT DETECTED Final    Comment: RESULT CALLED TO, READ BACK BY AND VERIFIED WITH: RN L BERDIK 1558 [id number] FCP (NOTE)  Client Requested Flag This nucleic acid amplification test was developed and its performance characteristics determined by [employer]. Nucleic acid amplification tests include PCR and TMA. This test has not been FDA cleared or approved. This test has been authorized by FDA under an Emergency Use Authorization (EUA). This test is only authorized for the duration of time the declaration that circumstances exist justifying the authorization of the emergency use of in vitro diagnostic tests for detection of SARS-CoV-2 virus and/or diagnosis of COVID-19 infection under section 564(b)(1) of the Act, 21 U.S.C. 106YIR-4(W) (1), unless the authorization is terminated or revoked sooner. When diagnostic testing is negative, the possibility of a false negative result should be considered in the context of a patient's recent exposures and the presence of clinical signs an d symptoms consistent with COVID-19. An individual without symptoms of COVID-  19 and who is not shedding SARS-CoV-2 virus would expect to have a negative (not detected) result in this assay. Performed At: [facility] [address] [place], [place] [id number] Jolene Schimke MD KX:[id number]    Coronavirus Source NASOPHARYNGEAL  Final    Comment: Performed at [facility], [address], [place], [place] [address]     Radiological Exams on Admission: No results found.  EKG: Independently reviewed.  Normal sinus rhythm.  Azucena Fallen DO [facility]  If 7PM-7AM, please contact night-coverage [email or web address] Password [facility]  08/21/2019, 7:34 AM

## 2019-08-21 NOTE — Progress Notes (Signed)
Due to availability of cipro strength here and age of pt, we will change cipro to oral cefdinir 600mg  PO qday per Dr. Avon Gully. PCN allergy>>rash/N/V. Risk of crossreativity is extremely low.   Onnie Boer, PharmD, BCIDP, AAHIVP, CPP Infectious Disease Pharmacist 08/21/2019 2:35 PM

## 2019-08-22 ENCOUNTER — Encounter (HOSPITAL_COMMUNITY): Payer: Self-pay | Admitting: Pharmacist Clinician (PhC)/ Clinical Pharmacy Specialist

## 2019-08-22 LAB — COMPREHENSIVE METABOLIC PANEL
ALT: 44 U/L (ref 0–44)
AST: 35 U/L (ref 15–41)
Albumin: 3.1 g/dL — ABNORMAL LOW (ref 3.5–5.0)
Alkaline Phosphatase: 74 U/L (ref 38–126)
Anion gap: 9 (ref 5–15)
BUN: 30 mg/dL — ABNORMAL HIGH (ref 8–23)
CO2: 25 mmol/L (ref 22–32)
Calcium: 8.5 mg/dL — ABNORMAL LOW (ref 8.9–10.3)
Chloride: 101 mmol/L (ref 98–111)
Creatinine, Ser: 0.78 mg/dL (ref 0.44–1.00)
GFR calc Af Amer: >60 mL/min (ref >60)
GFR calc non Af Amer: >60 mL/min (ref >60)
Glucose, Bld: 103 mg/dL — ABNORMAL HIGH (ref 70–99)
Potassium: 3.7 mmol/L (ref 3.5–5.1)
Sodium: 135 mmol/L (ref 135–145)
Total Bilirubin: 0.4 mg/dL (ref 0.3–1.2)
Total Protein: 6.1 g/dL — ABNORMAL LOW (ref 6.5–8.1)

## 2019-08-22 LAB — GLUCOSE, CAPILLARY
Glucose-Capillary: 106 mg/dL — ABNORMAL HIGH (ref 70–99)
Glucose-Capillary: 120 mg/dL — ABNORMAL HIGH (ref 70–99)
Glucose-Capillary: 169 mg/dL — ABNORMAL HIGH (ref 70–99)

## 2019-08-22 MED ORDER — DEXAMETHASONE 6 MG PO TABS: 6.0000 mg | ORAL_TABLET | Freq: Every day | ORAL | 0 refills | Status: AC

## 2019-08-22 MED ORDER — CEFDINIR 300 MG PO CAPS: 600.0000 mg | ORAL_CAPSULE | ORAL | 0 refills | Status: DC

## 2019-08-22 NOTE — Consult Note (Signed)
   [facility] CM Inpatient Consult   08/22/2019  Rebeca Valdivia Oct 24, 1941 [id number]     Patient evaluated for [place] Management services as was active with [facility] prior to admission.  Spoke with Tammy at [place] regarding patient's changes from a [facility] primary care provider.  Tammy verbalized patient's had planned to change physicians but did not know if in or out of [place].  Patient is not currently a beneficiary of the attributed [place] in the [employer].  The patient's Primary Care Provider, [place], Tasia Catchings, PA-C -[place] is not a [facility] primary care provider or is not [facility] affiliated.    This patient is Not eligible for [facility] Care Management Services.    Reason:   Physician is not on membership roster was used to verify non-eligible status.   For questions, please call:  Natividad Brood, RN BSN [facility] Liaison  [phone] business mobile phone Toll free office [phone]  Fax number: [phone] [email or web address]

## 2019-08-22 NOTE — TOC Transition Note (Addendum)
Transition of Care [facility]) - CM/SW Discharge Note   Patient Details  Name: Myrah Strawderman MRN: [id number] Date of Birth: August 14, 1941  Transition of Care [facility]) CM/SW Contact:  Ninfa Meeker, RN Phone Number: [phone] (working remotely) 08/22/2019, 12:52 PM   Clinical Narrative:   Case manager spoke with patient's son Chrissie Noa via telephone concerning his mom's discharge plan. He will be taking her home when he gets discharged as well. Chrissie Noa states that his mom recieves home hospice services and they will resume when she gets home. He said that his dad is setup with [facility] but he plans to change and get him connected with hospice care as well. Case manager wished him well caring for his parents as he is also recovering.  1500: Patient is active with [facility] with [facility]. CM was updated with this information by Natividad Brood, [place]. 1530: Case manager spoke with [place] at [place] and informed them of patient's planned discharge and need for someone to see her for [employer]. They are aware of patient's situation.    Final next level of care: Home w Hospice Care Barriers to Discharge: No Barriers Identified   Patient Goals and CMS Choice Patient states their goals for this hospitalization and ongoing recovery are:: per son to get better   Choice offered to / list presented to : NA  Discharge Placement                       Discharge Plan and Services   Discharge Planning Services: CM Consult Post Acute Care Choice: Hospice          DME Arranged: N/A                    Social Determinants of Health (SDOH) Interventions     Readmission Risk Interventions No flowsheet data found.

## 2019-08-22 NOTE — Discharge Summary (Signed)
Physician Discharge Summary  Kellie Escobar SAY:[id number] DOB: December 20, 1940 DOA: 08/17/2019  PCP: System, Pcp Not In  Admit date: 08/17/2019 Discharge date: 08/22/2019  Admitted From: Home Disposition: Home  Recommendations for Outpatient Follow-up:  1. Follow up with PCP in 1-2 weeks 2. Please obtain BMP/CBC in one week  Discharge Condition: Fair CODE STATUS: DNR Diet recommendation: As tolerated  Brief/Interim Summary: Kellie Escobar is a 78 y.o. female with history of advanced dementia, stroke, hypertension had come to the ER on October 16 with chills and cough and diarrhea.  At that time patient was diagnosed with COVID-19 and discharged home.  Now that patient's son is also having similar symptoms and usually takes care of patient and her husband was unable to do so and patient was brought to the ER along with her husband.  Husband also is COVID-19 positive.  Since visiting on August 11, 2019 no new changes.  Patient has been also generally weak. In the ER patient was not hypoxic labs do show mild hyponatremia 133 creatinine 0.9 WBC 7 hemoglobin 13.3 platelets 280 lactate was 2 chest x-ray shows infiltrates concerning for COVID-19 pneumonia.  EKG shows normal sinus rhythm.  Patient is otherwise alert awake.  Patient admitted for further observation.  Patient admitted as above for acute hypoxic respiratory failure in the setting of COVID-19 pneumonia.  Patient now on room air, poorly ambulatory at baseline appears to be wheelchair-bound the majority of the day other than transitioning, unable to evaluate for exertional hypoxia.  Patient has now completed remdesivir, will continue dexamethasone through November 1 per protocol.  Patient's inflammatory labs continue to improve daily, now appears to be back to baseline, tolerating p.o. well, remains asymptomatic and otherwise stable for discharge.  Had reportable dysuria during hospitalization, will complete cefdinir on 08/23/2019 for suspected  UTI although difficult to diagnose given patient's baseline dementia and difficulty to form review of systems.  Patient's family including husband and son are also admitted here, patient will be discharged home with son today as he is primary caretaker.  Patient will need close follow-up with PCP in the next 1 to 2 weeks for further evaluation to ensure resolution of symptoms.  Discharge Diagnoses:  Principal Problem:   COVID-19 Active Problems:   Essential hypertension   History of stroke   Hypothyroidism   Dementia (HCC)   Pneumonia due to COVID-19 virus  Discharge Instructions  Discharge Instructions    Call MD for:  difficulty breathing, headache or visual disturbances   Complete by: As directed    Call MD for:  extreme fatigue   Complete by: As directed    Call MD for:  hives   Complete by: As directed    Call MD for:  persistant dizziness or light-headedness   Complete by: As directed    Call MD for:  persistant nausea and vomiting   Complete by: As directed    Call MD for:  redness, tenderness, or signs of infection (pain, swelling, redness, odor or green/yellow discharge around incision site)   Complete by: As directed    Call MD for:  severe uncontrolled pain   Complete by: As directed    Call MD for:  temperature >100.4   Complete by: As directed    Diet - low sodium heart healthy   Complete by: As directed    Increase activity slowly   Complete by: As directed      Allergies as of 08/22/2019      Reactions  Codeine Nausea And Vomiting   Statins Other (See Comments)   unknown   Sulfa Antibiotics Hives   Sulfamethoxazole Hives   Penicillins Nausea Only, Rash, Nausea And Vomiting      Medication List    TAKE these medications   ALIVE ONCE DAILY WOMENS 50+ PO Take 1 tablet by mouth daily.   ALPRAZolam 0.5 MG tablet Commonly known as: XANAX Take 0.5 mg by mouth 3 (three) times daily as needed for anxiety.   amLODipine 5 MG tablet Commonly known as:  NORVASC Take 5 mg by mouth daily.   cefdinir 300 MG capsule Commonly known as: OMNICEF Take 2 capsules (600 mg total) by mouth daily.   clopidogrel 75 MG tablet Commonly known as: PLAVIX Take 75 mg by mouth daily.   dexamethasone 6 MG tablet Commonly known as: Decadron Take 1 tablet (6 mg total) by mouth daily for 6 days.   docusate sodium 100 MG capsule Commonly known as: COLACE Take 100 mg by mouth daily as needed for mild constipation.   donepezil 10 MG tablet Commonly known as: ARICEPT Take 10 mg by mouth at bedtime.   ezetimibe 10 MG tablet Commonly known as: ZETIA Take 10 mg by mouth daily.   fluticasone 50 MCG/ACT nasal spray Commonly known as: FLONASE Place 1 spray into both nostrils daily as needed for allergies or rhinitis.   levothyroxine 25 MCG tablet Commonly known as: SYNTHROID Take 37.5 mcg by mouth daily before breakfast.   losartan 100 MG tablet Commonly known as: COZAAR Take 100 mg by mouth daily.   memantine 5 MG tablet Commonly known as: NAMENDA Take 5 mg by mouth daily.   ondansetron 4 MG tablet Commonly known as: ZOFRAN Take 4 mg by mouth every 8 (eight) hours as needed for nausea or vomiting.   pantoprazole 20 MG tablet Commonly known as: PROTONIX Take 20 mg by mouth 2 (two) times daily.   Pataday 0.2 % Soln Generic drug: Olopatadine HCl Apply 1 drop to eye daily as needed (allergies).   potassium chloride SA 20 MEQ tablet Commonly known as: KLOR-CON Take 20 mEq by mouth daily.   pramipexole 0.5 MG tablet Commonly known as: MIRAPEX Take 0.5 mg by mouth at bedtime.   sertraline 50 MG tablet Commonly known as: ZOLOFT Take 150 mg by mouth daily.   solifenacin 5 MG tablet Commonly known as: VESICARE Take 5 mg by mouth daily.       Allergies  Allergen Reactions  . Codeine Nausea And Vomiting  . Statins Other (See Comments)    unknown  . Sulfa Antibiotics Hives  . Sulfamethoxazole Hives  . Penicillins Nausea Only, Rash  and Nausea And Vomiting    Procedures/Studies: Dg Chest Port 1 View  Result Date: 08/18/2019 CLINICAL DATA:  COVID-19 positive. EXAM: PORTABLE CHEST 1 VIEW COMPARISON:  08/11/2019 FINDINGS: Worsening heterogeneous opacities in the right mid lower lung zone. Unchanged heart size and mediastinal contours with retrocardiac hiatal hernia. No pleural fluid or pneumothorax. Loop recorder projects over the left chest. IMPRESSION: 1. Worsening heterogeneous opacities in the right mid lower lung zone, consistent with COVID-19 pneumonia. 2. Hiatal hernia. Electronically Signed   By: Narda RutherfordMelanie  Sanford M.D.   On: 08/18/2019 02:05   Dg Chest Port 1 View  Result Date: 08/11/2019 CLINICAL DATA:  Cough. Shortness of breath. Fever. Patient's husband diagnosed with COVID-19. EXAM: PORTABLE CHEST 1 VIEW COMPARISON:  05/29/2019 FINDINGS: Unchanged heart size and mediastinal contours. Aortic atherosclerosis. Large retrocardiac hiatal hernia as before. Lower  lung volumes. Vague heterogeneous bilateral opacities in the mid lower lung zone predominant distribution. Implanted loop recorder projects over the left chest wall. No evidence of pulmonary edema or pneumothorax. Chronic blunting of left costophrenic angle. IMPRESSION: 1. Vague heterogeneous opacities in the mid lower lung zone predominant, nonspecific but suspicious for COVID-19 pneumonia in the setting. 2. Large retrocardiac hiatal hernia. 3.  Aortic Atherosclerosis (ICD10-I70.0). Electronically Signed   By: Narda Rutherford M.D.   On: 08/11/2019 19:25    Subjective: No acute issues or events overnight, tolerating p.o. well, no longer hypoxic, denies dyspnea, chest pain, shortness of breath, nausea, vomiting, diarrhea, constipation, headache, fevers, chills.   Discharge Exam: Vitals:   08/22/19 0404 08/22/19 0700  BP: (!) 157/72 (!) 162/88  Pulse: 70   Resp: 18 18  Temp: 98.3 F (36.8 C) 97.9 F (36.6 C)  SpO2: 95%    Vitals:   08/21/19 1910 08/21/19  2105 08/22/19 0404 08/22/19 0700  BP:  136/65 (!) 157/72 (!) 162/88  Pulse: 89 85 70   Resp:  Temp:  97.7 F (36.5 C) 98.3 F (36.8 C) 97.9 F (36.6 C)  TempSrc:  Oral Oral Oral  SpO2: 96% 94% 95%   Weight:      Height:        General:  Pleasantly resting in bed, No acute distress. HEENT:  Normocephalic atraumatic.  Sclerae nonicteric, noninjected.  Extraocular movements intact bilaterally. Neck:  Without mass or deformity.  Trachea is midline. Lungs:  Clear to auscultate bilaterally without rhonchi, wheeze, or rales. Heart:  Regular rate and rhythm.  Without murmurs, rubs, or gallops. Abdomen:  Soft, nontender, nondistended.  Without guarding or rebound. Extremities: Without cyanosis, clubbing, edema, or obvious deformity. Vascular:  Dorsalis pedis and posterior tibial pulses palpable bilaterally. Skin:  Warm and dry, no erythema, no ulcerations.   The results of significant diagnostics from this hospitalization (including imaging, microbiology, ancillary and laboratory) are listed below for reference.     Labs:  Basic Metabolic Panel: Recent Labs  Lab 08/18/19 1411 08/19/19 0145 08/20/19 0051 08/21/19 0300 08/22/19 0835  NA 138 135 136 136 135  K 4.7 3.9 4.2 3.7 3.7  CL 104 100 102 100 101  CO2 GLUCOSE 168* 115* 111* 124* 103*  BUN 15 27* 27* 26* 30*  CREATININE 0.94 0.84 0.85 0.86 0.78  CALCIUM 9.1 9.0 8.9 9.2 8.5*   Liver Function Tests: Recent Labs  Lab 08/18/19 1411 08/19/19 0145 08/20/19 0051 08/21/19 0300 08/22/19 0835  AST 34 35  ALT 32 44  ALKPHOS 86 70 76 71 74  BILITOT 0.4 0.8 0.5 0.4 0.4  PROT 7.1 6.6 6.6 6.5 6.1*  ALBUMIN 3.5 3.4* 3.4* 3.2* 3.1*   CBC: Recent Labs  Lab 08/17/19 2005 08/18/19 1411 08/19/19 0145 08/20/19 0051 08/21/19 0300  WBC 7.0 5.6 5.5 8.2 9.5  NEUTROABS 5.0 4.8  --   --   --   HGB 13.3 13.5 12.0 12.2 11.9*  HCT 41.9 40.9 36.4 37.4 36.6  MCV 85.9 85.6 84.3 85.4 84.3   PLT 280 261 227 249 263   CBG: Recent Labs  Lab 08/21/19 0036 08/21/19 0605 08/21/19 1131 08/21/19 2038 08/22/19 0716  GLUCAP 145* 121* 176* 168* 106*   D-Dimer Recent Labs    08/20/19 0051 08/21/19 0300  DDIMER 0.48 0.38   Urinalysis    Component Value Date/Time   COLORURINE YELLOW 05/29/2019 2339  APPEARANCEUR HAZY (A) 05/29/2019 2339   LABSPEC 1.019 05/29/2019 2339   PHURINE 6.0 05/29/2019 2339   GLUCOSEU NEGATIVE 05/29/2019 2339   HGBUR NEGATIVE 05/29/2019 2339   BILIRUBINUR NEGATIVE 05/29/2019 2339   KETONESUR NEGATIVE 05/29/2019 2339   PROTEINUR NEGATIVE 05/29/2019 2339   NITRITE NEGATIVE 05/29/2019 2339   LEUKOCYTESUR SMALL (A) 05/29/2019 2339    Time coordinating discharge: Over 30 minutes  SIGNED:  Little Ishikawa, DO [facility] 08/22/2019, 11:34 AM Pager   If 7PM-7AM, please contact night-coverage [email or web address] Password [email or web address]

## 2019-08-22 NOTE — Discharge Instructions (Signed)

## 2019-08-23 LAB — URINE CULTURE: Culture: >=100000 — AB

## 2019-08-24 ENCOUNTER — Ambulatory Visit: Payer: Medicare Other

## 2019-09-04 ENCOUNTER — Telehealth: Payer: Self-pay

## 2019-09-04 NOTE — Telephone Encounter (Signed)
Received TC from [place] with [facility].  She states pt has her 1st appt with [facility] on 09/11/19 in [facility] and pt will be transferring care from another practice.  Almyra Free is asking for a RX for Xanax 0.5mg  TID.  This RN informed Almyra Free pt has not been seen at [facility] and would need to be seen by [facility] MD before RX's would be written.  Advised to call pt's current PCP or offered an appt in [facility] this week.  Almyra Free with [facility] states she will call current PCP and if she is unable to obtain RX, she will ask pt if she would like to move her appt at [facility] up to this week. Pt has not been assigned to PCP, will forward to Dr. Lynnae January. SChaplin, RN,BSN

## 2019-09-04 NOTE — Telephone Encounter (Signed)
TC to [place] at [place], VM obtained and [facility] compliant message left regarding RX for alprazolam.  Asked nurse Almyra Free to return RN's call even if she was able to obtain alprazolam RX from pt's current pcp. SChaplin, RN,BSN

## 2019-09-04 NOTE — Telephone Encounter (Signed)
I agree that she needs an appointment.  I reviewed her narcotic database and she has been getting 90 pills for 3 times daily dosing from a physician until September when a PA took over prescribing and it looks like it started a quick taper with 60 pills in September and 50 in October.  Can you please get those records?  Her physician was Dr. Truman Hayward at [facility] [place] [place].  The PA is Yevonne Aline [facility] [place].  If there is an issue, and there is going to be delay in getting her alprazolam, please let me know because I would be willing to prescribe a couple days until she can get into [facility] to prevent withdrawal seizures.

## 2019-09-11 ENCOUNTER — Ambulatory Visit (INDEPENDENT_AMBULATORY_CARE_PROVIDER_SITE_OTHER): Payer: Medicare Other | Admitting: Internal Medicine

## 2019-09-11 ENCOUNTER — Encounter: Payer: Self-pay | Admitting: Internal Medicine

## 2019-09-11 ENCOUNTER — Other Ambulatory Visit: Payer: Self-pay

## 2019-09-11 VITALS — BP 138/88 | HR 92 | Temp 97.5°F | Ht 59.0 in | Wt 111.2 lb

## 2019-09-11 DIAGNOSIS — Z79890 Hormone replacement therapy (postmenopausal): Secondary | ICD-10-CM

## 2019-09-11 DIAGNOSIS — F0151 Vascular dementia with behavioral disturbance: Secondary | ICD-10-CM

## 2019-09-11 DIAGNOSIS — I10 Essential (primary) hypertension: Secondary | ICD-10-CM

## 2019-09-11 DIAGNOSIS — R197 Diarrhea, unspecified: Secondary | ICD-10-CM

## 2019-09-11 DIAGNOSIS — R3981 Functional urinary incontinence: Secondary | ICD-10-CM

## 2019-09-11 DIAGNOSIS — F01518 Vascular dementia, unspecified severity, with other behavioral disturbance: Secondary | ICD-10-CM

## 2019-09-11 DIAGNOSIS — E039 Hypothyroidism, unspecified: Secondary | ICD-10-CM | POA: Diagnosis not present

## 2019-09-11 DIAGNOSIS — J1289 Other viral pneumonia: Secondary | ICD-10-CM | POA: Diagnosis not present

## 2019-09-11 DIAGNOSIS — Z8673 Personal history of transient ischemic attack (TIA), and cerebral infarction without residual deficits: Secondary | ICD-10-CM

## 2019-09-11 DIAGNOSIS — U071 COVID-19: Secondary | ICD-10-CM

## 2019-09-11 DIAGNOSIS — F0150 Vascular dementia without behavioral disturbance: Secondary | ICD-10-CM

## 2019-09-11 MED ORDER — ALIVE ONCE DAILY WOMENS 50+ PO TABS: 1.0000 | ORAL_TABLET | Freq: Every day | ORAL | 3 refills | Status: DC

## 2019-09-11 MED ORDER — SOLIFENACIN SUCCINATE 5 MG PO TABS: 5.0000 mg | ORAL_TABLET | Freq: Every day | ORAL | 3 refills | Status: DC

## 2019-09-11 MED ORDER — ALPRAZOLAM 0.5 MG PO TABS: 0.5000 mg | ORAL_TABLET | Freq: Three times a day (TID) | ORAL | 0 refills | Status: DC | PRN

## 2019-09-11 NOTE — Patient Instructions (Signed)
Dear [name],  Thank you for allowing [place] to provide your care today. Today we discussed your urinary accidents    I have ordered bmp, cbc, tsh labs for you. I will call if any are abnormal.    Today we made the following changes to your medications:  None  Please follow-up in 3 months.    Should you have any questions or concerns please call the internal medicine clinic at [phone].    Thank you for choosing [facility].
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
## 2019-09-12 DIAGNOSIS — R3981 Functional urinary incontinence: Secondary | ICD-10-CM | POA: Insufficient documentation

## 2019-09-12 LAB — BMP8+ANION GAP
Anion Gap: 18 mmol/L (ref 10.0–18.0)
BUN/Creatinine Ratio: 12 (ref 12–28)
BUN: 13 mg/dL (ref 8–27)
CO2: 20 mmol/L (ref 20–29)
Calcium: 9.5 mg/dL (ref 8.7–10.3)
Chloride: 101 mmol/L (ref 96–106)
Creatinine, Ser: 1.06 mg/dL — ABNORMAL HIGH (ref 0.57–1.00)
GFR calc Af Amer: 59 mL/min/{1.73_m2} — ABNORMAL LOW (ref >59)
GFR calc non Af Amer: 51 mL/min/{1.73_m2} — ABNORMAL LOW (ref >59)
Glucose: 102 mg/dL — ABNORMAL HIGH (ref 65–99)
Potassium: 3.8 mmol/L (ref 3.5–5.2)
Sodium: 139 mmol/L (ref 134–144)

## 2019-09-12 LAB — TSH: TSH: 1.36 u[IU]/mL (ref 0.450–4.500)

## 2019-09-12 NOTE — Assessment & Plan Note (Addendum)
Based on chart review, on levothyroxine 37.32mcg at home. Denies any palpitations, weight changes, fatigue. Will check TSh to confirm correct dosing at home.  - Check TSH  Addendum: TSH wnl @ 1.36. C/w levothyroxine 37.58mcg

## 2019-09-12 NOTE — Assessment & Plan Note (Addendum)
Presents for f/u after recent admission for COVID-19. Diagnosed on 08/11/19. At discharge no oxygen requirement. Have been stable at home. Denies any dyspnea or cough. Mentions no further contact. Endorsing some loose stools - likely as sequelae of viral infection and states it is improving.  - Appears to have resolved and recovered

## 2019-09-12 NOTE — Assessment & Plan Note (Signed)
BP Readings from Last 3 Encounters:  09/11/19 138/88  08/22/19 (!) 144/61  08/11/19 (!) 181/88   BP currently elevated but meeting new provider. On losartan, amlodipine at home. Unclear if taking medications as prescribed due to dementia. Normal renal fx and electrolytes on chart review during recent hospitalization. Will recheck since she is month out.  - BMP today - C/w losartan, amlodipine

## 2019-09-12 NOTE — Assessment & Plan Note (Addendum)
Endorsing increasing 'accidents' with urinary incontinence. Based on chart review on solfenacin at home for urge incontinence. Based on son's description, appears to be more due to functional incontinence due to vascular dementia as she has difficulty performing ADLs and IADLs at home after her stroke. Also mentions issue with 'hoarding situation' with large amount of '[employer] purchases' making it difficulty to navigate to the bathroom at home.  - Refill for solfenacin sent - Will assist with son in finding AFL
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
## 2019-09-13 NOTE — Progress Notes (Signed)
Internal Medicine Clinic Attending ° °Case discussed with Dr. Lee at the time of the visit.  We reviewed the resident’s history and exam and pertinent patient test results.  I agree with the assessment, diagnosis, and plan of care documented in the resident’s note.  °

## 2019-09-25 ENCOUNTER — Ambulatory Visit: Payer: Medicare Other

## 2019-10-04 ENCOUNTER — Other Ambulatory Visit: Payer: Self-pay | Admitting: Internal Medicine

## 2019-10-04 DIAGNOSIS — F01518 Vascular dementia, unspecified severity, with other behavioral disturbance: Secondary | ICD-10-CM

## 2019-10-04 DIAGNOSIS — F0151 Vascular dementia with behavioral disturbance: Secondary | ICD-10-CM

## 2019-10-04 NOTE — Telephone Encounter (Signed)
Refill Request- Pt's son is requesting 99 day supply if possible. Pt has questions about the mg on the pt's Synthroid medication and needs a cal back.   levothyroxine (SYNTHROID) 25 MCG tablet  memantine (NAMENDA) 5 MG tabletmemantine (NAMENDA) 5 MG tablet  ALPRAZolam (XANAX) 0.5 MG tablet  Pt is requesting use a different pharmacy   [facility] [address], [place], [place] [address]  Phone: [phone]

## 2019-10-05 MED ORDER — ALPRAZOLAM 0.5 MG PO TABS: 0.5000 mg | ORAL_TABLET | Freq: Three times a day (TID) | ORAL | 2 refills | Status: DC | PRN

## 2019-10-05 MED ORDER — LEVOTHYROXINE SODIUM 25 MCG PO TABS: 37.5000 ug | ORAL_TABLET | Freq: Every day | ORAL | 0 refills | Status: DC

## 2019-10-05 MED ORDER — MEMANTINE HCL 5 MG PO TABS: 5.0000 mg | ORAL_TABLET | Freq: Every day | ORAL | 0 refills | Status: DC

## 2019-10-15 DIAGNOSIS — I639 Cerebral infarction, unspecified: Secondary | ICD-10-CM | POA: Diagnosis not present

## 2019-10-16 DIAGNOSIS — I639 Cerebral infarction, unspecified: Secondary | ICD-10-CM | POA: Diagnosis not present

## 2019-10-30 ENCOUNTER — Other Ambulatory Visit: Payer: Self-pay | Admitting: Internal Medicine

## 2019-10-30 ENCOUNTER — Telehealth: Payer: Self-pay | Admitting: *Deleted
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
## 2019-10-31 ENCOUNTER — Other Ambulatory Visit: Payer: Self-pay | Admitting: *Deleted

## 2019-10-31 DIAGNOSIS — F0151 Vascular dementia with behavioral disturbance: Secondary | ICD-10-CM

## 2019-10-31 DIAGNOSIS — F01518 Vascular dementia, unspecified severity, with other behavioral disturbance: Secondary | ICD-10-CM

## 2019-10-31 NOTE — Telephone Encounter (Signed)
[facility] - Xanax is prescribed TID; son is requesting 90 tabs. Thanks

## 2019-11-01 NOTE — Telephone Encounter (Addendum)
[facility] stating: "Patient's son says alprazolam needs to be written for 90 instead of 50, she takes 1 tab 3X day." Please advise. Kinnie Feil, BSN, RN-BC

## 2019-11-02 MED ORDER — ALPRAZOLAM 0.5 MG PO TABS: 0.5000 mg | ORAL_TABLET | Freq: Three times a day (TID) | ORAL | 2 refills | Status: DC | PRN

## 2019-11-02 NOTE — Addendum Note (Signed)
Addended by: Gardenia Phlegm on: 11/02/2019 09:06 AM   Modules accepted: Orders

## 2019-11-02 NOTE — Telephone Encounter (Signed)
Xanax rx refilled but "Phone In"; not sent electronically. "Xanax 0.5 mg Take 1 tablet (0.5 mg total) by mouth 3 (three) times daily as needed for anxiety"  qty # 90 x 2 [facility].

## 2019-11-27 ENCOUNTER — Other Ambulatory Visit: Payer: Self-pay | Admitting: *Deleted

## 2019-11-27 NOTE — Telephone Encounter (Signed)
Nurse from [facility] calls and states pt's son stated [facility] only [facility] for #50 not #90. [facility] old script for #50 to fill this month, ask them to cancel any script for xanax before 1/7 and to adjust script since they erred, he was agreeable and will call pt's son and explain as soon as this last script is used they will fill for 90. Attempted to call julie back and inform her, no answer and vmail full. Also ask her to remind son of upcoming appt. Nothing you need to do dr Barbaraann Faster just a [place]

## 2019-11-28 MED ORDER — AMLODIPINE BESYLATE 5 MG PO TABS: 5.0000 mg | ORAL_TABLET | Freq: Every day | ORAL | 1 refills | Status: DC

## 2019-12-05 ENCOUNTER — Telehealth: Payer: Self-pay | Admitting: *Deleted
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
## 2019-12-05 NOTE — Telephone Encounter (Signed)
Thank you. Kellie Escobar can be seen by himself. I can meet with Ms.Weisenberger and son ( caregiver)  at her appointed time.

## 2019-12-06 ENCOUNTER — Encounter: Payer: Self-pay | Admitting: Internal Medicine

## 2019-12-06 ENCOUNTER — Ambulatory Visit (INDEPENDENT_AMBULATORY_CARE_PROVIDER_SITE_OTHER): Payer: Medicare Other | Admitting: Internal Medicine

## 2019-12-06 VITALS — BP 167/70 | HR 72 | Temp 98.4°F | Ht 59.0 in | Wt 104.7 lb

## 2019-12-06 DIAGNOSIS — E039 Hypothyroidism, unspecified: Secondary | ICD-10-CM | POA: Diagnosis not present

## 2019-12-06 DIAGNOSIS — G4700 Insomnia, unspecified: Secondary | ICD-10-CM

## 2019-12-06 DIAGNOSIS — Z6821 Body mass index (BMI) 21.0-21.9, adult: Secondary | ICD-10-CM

## 2019-12-06 DIAGNOSIS — Z79890 Hormone replacement therapy (postmenopausal): Secondary | ICD-10-CM

## 2019-12-06 DIAGNOSIS — F419 Anxiety disorder, unspecified: Secondary | ICD-10-CM | POA: Diagnosis not present

## 2019-12-06 DIAGNOSIS — R634 Abnormal weight loss: Secondary | ICD-10-CM

## 2019-12-06 DIAGNOSIS — F01518 Vascular dementia, unspecified severity, with other behavioral disturbance: Secondary | ICD-10-CM

## 2019-12-06 DIAGNOSIS — I10 Essential (primary) hypertension: Secondary | ICD-10-CM | POA: Diagnosis not present

## 2019-12-06 DIAGNOSIS — Z79899 Other long term (current) drug therapy: Secondary | ICD-10-CM

## 2019-12-06 DIAGNOSIS — F0151 Vascular dementia with behavioral disturbance: Secondary | ICD-10-CM | POA: Diagnosis not present

## 2019-12-06 MED ORDER — ENSURE COMPLETE PO LIQD: 237.0000 mL | Freq: Two times a day (BID) | ORAL | 11 refills | Status: DC
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
## 2019-12-06 NOTE — Patient Instructions (Signed)
Thank you for trusting me with your care. To recap, today we discussed the following:   High blood pressure -reported normal at home, you have not taken your medication today  Weight loss, insomnia, anxiety - we will check your Thyroid function today, and follow up if its abnormal.  - I have prescribed ensure , drink two shakes per day.

## 2019-12-06 NOTE — Assessment & Plan Note (Signed)
BP 167/70 at todays visit. Patient did not take her medications this morning. Son reports her BP has been well controlled at home on regimen. If elevated at next visit I will discuss keeping a BP log to review  P: Continue Losartan 100 mg, Amlodipine 5 mg

## 2019-12-06 NOTE — Progress Notes (Signed)
   CC:  Anxiety and weight loss  HPI:[name] is a 79 y.o. female who presents for evaluation of anxiety, weight loss, and dementia. Please see individual problem based A/P for details.    Past Medical History:  Diagnosis Date  . Dementia (HCC)   . History of loop recorder   . Hypertension   . Stroke [facility])    Review of Systems:  ROS negative except as per HPI.  Physical Exam: Vitals:   12/06/19 1437  BP: (!) 167/70  Pulse: 72  Temp: 98.4 F (36.9 C)  TempSrc: Oral  SpO2: 97%  Weight: 104 lb 11.2 oz (47.5 kg)  Height: 4\' 11"  (1.499 m)    General: Alert, nl appearance HEENT: Normocephalic, atraumatic , EOMI, Conjunctivae normal Cardiovascular: Normal rate, regular rhythm.  No murmurs, rubs, or gallops Pulmonary : Effort normal, breath sounds normal. No wheezes, rales, or rhonchi Abdominal: soft, nontender,bowel sound present Musculoskeletal: no swelling , deformity, injury ,or tenderness in extremities, Skin: Warm, dry , no bruising, erythema, or rash Neurological: cranial nerve II-XII intact, no weakness, no sensory deficit to light touch Psychiatric/Behavioral:  normal mood, normal behavior   Assessment & Plan:   See Encounters Tab for problem based charting.  Patient discussed with Dr. 

## 2019-12-06 NOTE — Assessment & Plan Note (Addendum)
Patient reports worsening anxiety ,insomnia, weight loss 7 lb since last visit in November. Also having loose bowel movements. With symptoms consistent with iatrogenic hyperthyroidism I will check her TSH today. However, TSH nl in November. Recommended treating symptoms for now and ill follow up TSH. Melatonin for sleep and will prescribe ensure BID.  P: - TSH  - Continue levothyroxine 37.71mcg  Addendum: TSH wnl 1.43. Continue levothyroxine 37.5. Will follow up constitution of symptoms on next visit.

## 2019-12-07 LAB — TSH: TSH: 1.43 u[IU]/mL (ref 0.450–4.500)

## 2019-12-07 NOTE — Progress Notes (Signed)
Internal Medicine Clinic Attending  Case discussed with Dr. Steen at the time of the visit.  We reviewed the resident's history and exam and pertinent patient test results.  I agree with the assessment, diagnosis, and plan of care documented in the resident's note.  Loreta Blouch, M.D., Ph.D.  

## 2019-12-22 ENCOUNTER — Telehealth: Payer: Self-pay
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
## 2019-12-25 ENCOUNTER — Other Ambulatory Visit: Payer: Self-pay | Admitting: *Deleted

## 2019-12-25 DIAGNOSIS — F01518 Vascular dementia, unspecified severity, with other behavioral disturbance: Secondary | ICD-10-CM

## 2019-12-25 DIAGNOSIS — F0151 Vascular dementia with behavioral disturbance: Secondary | ICD-10-CM

## 2019-12-25 NOTE — Telephone Encounter (Signed)
Kellie Escobar, I will give Kellie Escobar a call to discuss her concerns at lunch today (3/1//2021).

## 2019-12-26 MED ORDER — ALPRAZOLAM 0.5 MG PO TABS: 0.5000 mg | ORAL_TABLET | Freq: Three times a day (TID) | ORAL | 2 refills | Status: DC | PRN

## 2019-12-26 MED ORDER — PANTOPRAZOLE SODIUM 20 MG PO TBEC: 20.0000 mg | DELAYED_RELEASE_TABLET | Freq: Two times a day (BID) | ORAL | 1 refills | Status: DC

## 2019-12-26 MED ORDER — SERTRALINE HCL 50 MG PO TABS: 150.0000 mg | ORAL_TABLET | Freq: Every day | ORAL | 1 refills | Status: DC

## 2019-12-26 MED ORDER — LEVOTHYROXINE SODIUM 25 MCG PO TABS: 37.5000 ug | ORAL_TABLET | Freq: Every day | ORAL | 1 refills | Status: DC

## 2019-12-26 MED ORDER — LOSARTAN POTASSIUM 100 MG PO TABS: 100.0000 mg | ORAL_TABLET | Freq: Every day | ORAL | 1 refills | Status: DC

## 2019-12-26 MED ORDER — MEMANTINE HCL 5 MG PO TABS: 5.0000 mg | ORAL_TABLET | Freq: Every day | ORAL | 1 refills | Status: DC

## 2019-12-26 MED ORDER — POTASSIUM CHLORIDE CRYS ER 20 MEQ PO TBCR: 20.0000 meq | EXTENDED_RELEASE_TABLET | Freq: Every day | ORAL | 1 refills | Status: DC

## 2019-12-29 ENCOUNTER — Telehealth: Payer: Self-pay

## 2019-12-29 ENCOUNTER — Ambulatory Visit (INDEPENDENT_AMBULATORY_CARE_PROVIDER_SITE_OTHER): Payer: Medicare Other | Admitting: Internal Medicine

## 2019-12-29 ENCOUNTER — Other Ambulatory Visit: Payer: Self-pay

## 2019-12-29 DIAGNOSIS — R059 Cough, unspecified: Secondary | ICD-10-CM

## 2019-12-29 DIAGNOSIS — R05 Cough: Secondary | ICD-10-CM | POA: Diagnosis not present

## 2019-12-29 DIAGNOSIS — J310 Chronic rhinitis: Secondary | ICD-10-CM | POA: Diagnosis not present

## 2019-12-29 NOTE — Progress Notes (Signed)
  [facility] Internal Medicine Residency Telephone Encounter Continuity Care Appointment  HPI:   This telephone encounter was created for Ms. Renne Cornick Phommachanh on 12/29/2019 for the following purpose/cc non productive cough and runny nose.   Past Medical History:  Past Medical History:  Diagnosis Date  . Dementia (HCC)   . History of loop recorder   . Hypertension   . Stroke (HCC)       ROS:  No fever, watery/itchy eyes, or sick contacts.    Assessment / Plan / Recommendations:   Please see A&P under problem oriented charting for assessment of the patient's acute and chronic medical conditions.   As always, pt is advised that if symptoms worsen or new symptoms arise, they should go to an urgent care facility or to to ER for further evaluation.   Consent and Medical Decision Making:   Patient discussed with Dr. Rogelia Boga  This is a telephone encounter between Scarlette Slice Petkus and Milus Banister on 12/29/2019 for non allergic rhinitis. The visit was conducted with the patient located at home and Milus Banister at [facility]. The patient's identity was confirmed using their DOB and current address. The patient has consented to being evaluated through a telephone encounter and understands the associated risks (an examination cannot be done and the patient may need to come in for an appointment) / benefits (allows the patient to remain at home, decreasing exposure to coronavirus). I personally spent 8 minutes on medical discussion.

## 2019-12-29 NOTE — Assessment & Plan Note (Signed)
This was a telephone encounter with Kellie Escobar and her son ( healthcare power of attorney). Patient reports a non productive cough which began approximately five days ago along with a runny nose. She has history of allergic rhinitis, but not this time of year. Denies watery/itchy eyes. She felt warm this morning, but has not had a  objective fever. Throat feels irritated, but denies it being sore. No one else has been sick around her. She had a COVID infection back in December. Patient has not tried her Flonase, she says she doesn't like using it.  I expect she has nonallergic rhinitis or possibly allergic rhinitis with post nasal drip. I recommended supportive care with clear fluids. Discussed with patient and Son she needs to be evaluated in person if she develops a fever or cough worsens.

## 2019-12-29 NOTE — Progress Notes (Signed)
Internal Medicine Clinic Attending ° °Case discussed with Dr. Steen  at the time of the visit.  We reviewed the resident’s history and exam and pertinent patient test results.  I agree with the assessment, diagnosis, and plan of care documented in the resident’s note.  °

## 2019-12-29 NOTE — Telephone Encounter (Signed)
Received call from pts son, states pt has had cough and runny nose X1 week and wants to know what meds to give her. Added to telehealth schedule today. SChaplin, RN,BSN

## 2020-01-04 ENCOUNTER — Telehealth: Payer: Self-pay | Admitting: Internal Medicine

## 2020-01-04 ENCOUNTER — Ambulatory Visit: Payer: Medicare Other

## 2020-01-04 NOTE — Telephone Encounter (Signed)
Pt's son cancelled appt

## 2020-01-04 NOTE — Telephone Encounter (Signed)
Per pt son, needs some thing extra for her nerves and depression [phone]  Pt contact pt [phone]

## 2020-01-04 NOTE — Telephone Encounter (Signed)
Son called back, refused telehealth said they would just wait then ask if they could come in to clinic today. Made appt, he then states he will need to confirm how his father feels about the appt, he will call back

## 2020-01-04 NOTE — Telephone Encounter (Signed)
rtc to son, rang 15 times and triage hung up will try again

## 2020-01-04 NOTE — Telephone Encounter (Signed)
Patient will need to be scheduled for a telephone encounter for me to evaluate and prescribe medications.
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
## 2020-01-12 IMAGING — DX DG CHEST 1V PORT
1 series · 1 of 1 positions shown · non-contrast
Comparison: 08/11/2019

CLINICAL DATA: 4JHWX-CR positive.

EXAM:
PORTABLE CHEST 1 VIEW

[chest ap]
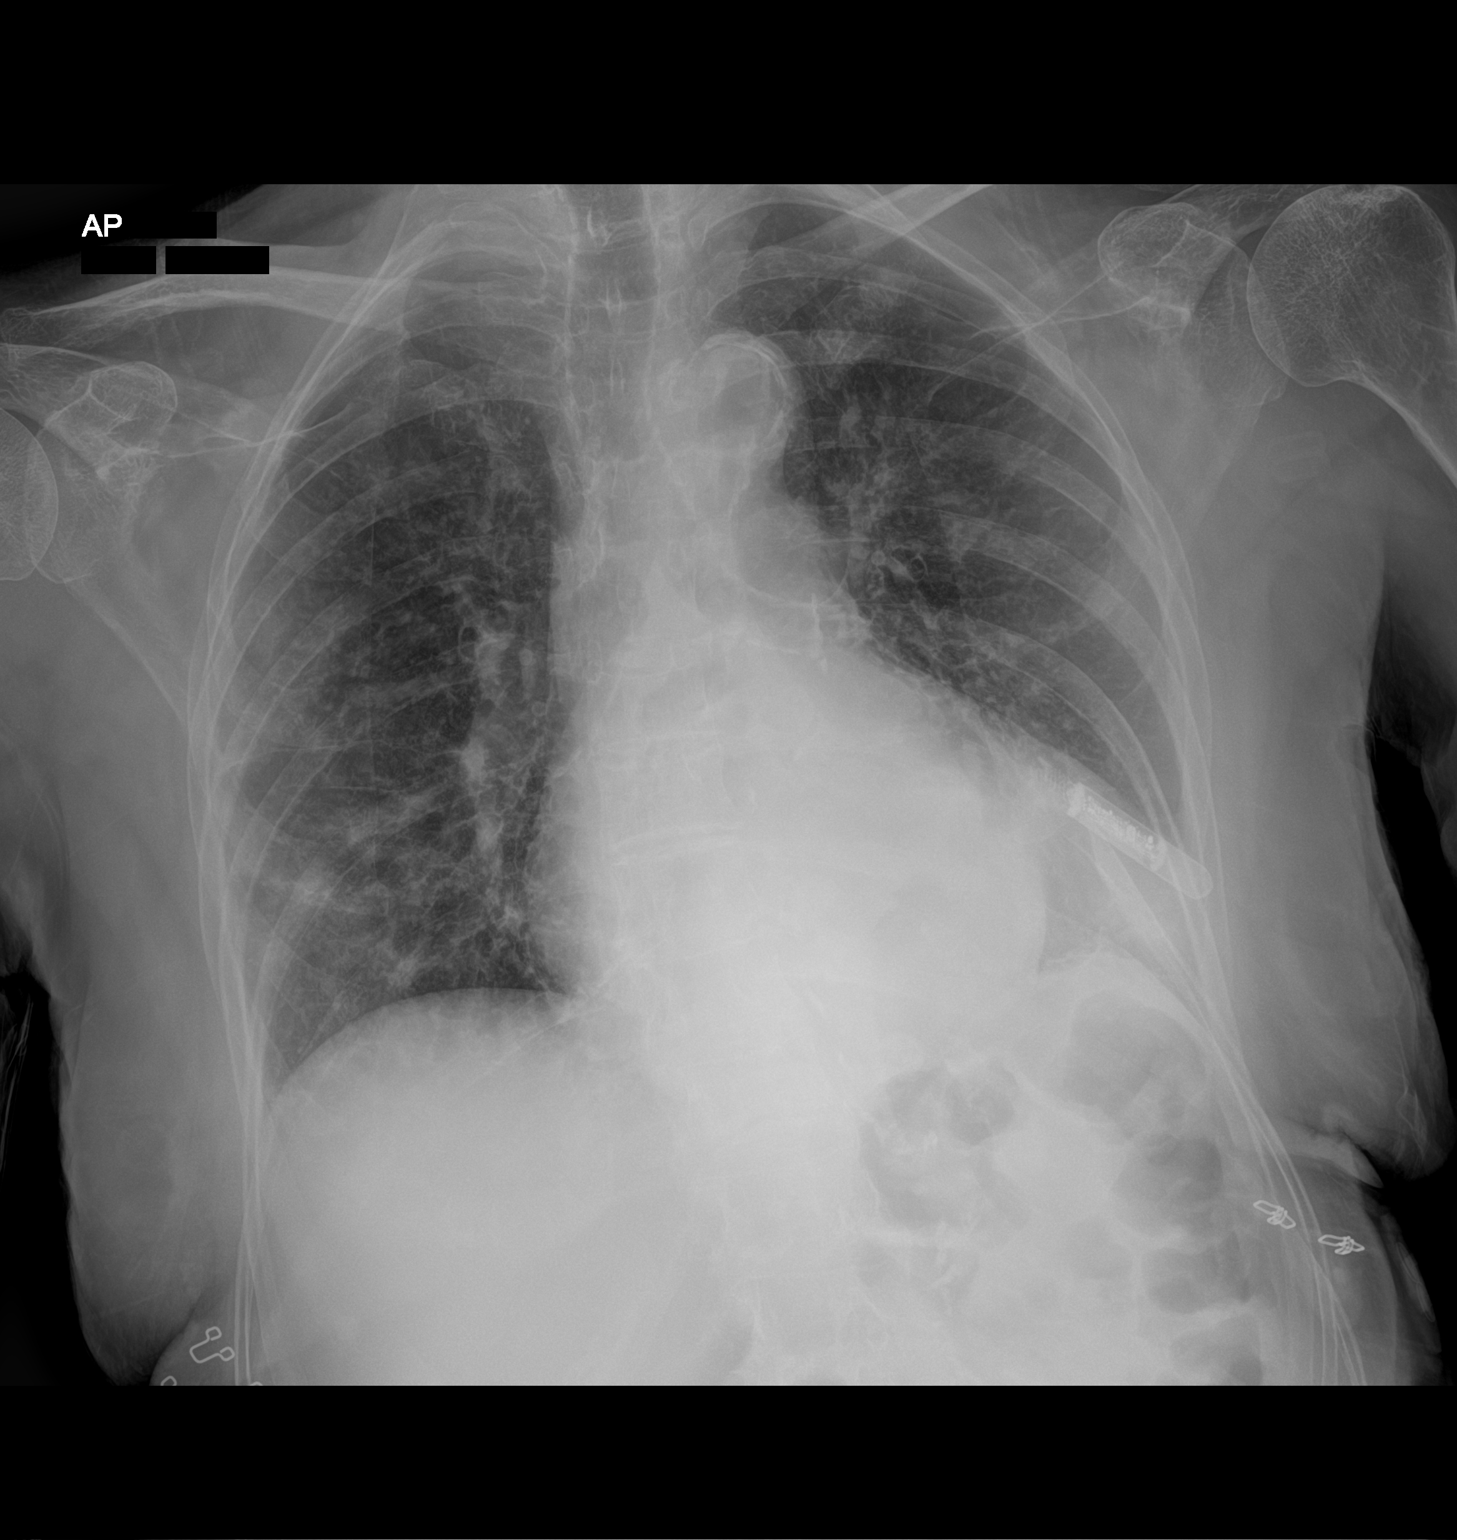

[1 of 1 positions shown; findings below may reference images not displayed]

FINDINGS: Worsening heterogeneous opacities in the right mid lower lung zone.
Unchanged heart size and mediastinal contours with retrocardiac
hiatal hernia. No pleural fluid or pneumothorax. Loop recorder
projects over the left chest.
IMPRESSION: 1. Worsening heterogeneous opacities in the right mid lower lung
zone, consistent with 4JHWX-CR pneumonia.
2. Hiatal hernia.

## 2020-01-13 DIAGNOSIS — I639 Cerebral infarction, unspecified: Secondary | ICD-10-CM | POA: Diagnosis not present

## 2020-01-15 ENCOUNTER — Encounter: Payer: Medicare Other | Admitting: Internal Medicine

## 2020-01-22 ENCOUNTER — Encounter: Payer: Self-pay | Admitting: Internal Medicine

## 2020-01-22 ENCOUNTER — Ambulatory Visit (INDEPENDENT_AMBULATORY_CARE_PROVIDER_SITE_OTHER): Payer: Medicare Other | Admitting: Internal Medicine

## 2020-01-22 VITALS — BP 163/81 | HR 76 | Temp 97.8°F | Ht 59.0 in | Wt 102.2 lb

## 2020-01-22 DIAGNOSIS — S20462A Insect bite (nonvenomous) of left back wall of thorax, initial encounter: Secondary | ICD-10-CM | POA: Diagnosis not present

## 2020-01-22 DIAGNOSIS — F0151 Vascular dementia with behavioral disturbance: Secondary | ICD-10-CM | POA: Diagnosis not present

## 2020-01-22 DIAGNOSIS — B351 Tinea unguium: Secondary | ICD-10-CM | POA: Diagnosis not present

## 2020-01-22 DIAGNOSIS — Z79899 Other long term (current) drug therapy: Secondary | ICD-10-CM | POA: Diagnosis not present

## 2020-01-22 DIAGNOSIS — I10 Essential (primary) hypertension: Secondary | ICD-10-CM | POA: Diagnosis not present

## 2020-01-22 DIAGNOSIS — S30860A Insect bite (nonvenomous) of lower back and pelvis, initial encounter: Secondary | ICD-10-CM

## 2020-01-22 DIAGNOSIS — F01518 Vascular dementia, unspecified severity, with other behavioral disturbance: Secondary | ICD-10-CM

## 2020-01-22 DIAGNOSIS — W57XXXA Bitten or stung by nonvenomous insect and other nonvenomous arthropods, initial encounter: Secondary | ICD-10-CM

## 2020-01-22 MED ORDER — PRAMIPEXOLE DIHYDROCHLORIDE 0.5 MG PO TABS: 0.5000 mg | ORAL_TABLET | Freq: Every day | ORAL | 3 refills | Status: DC

## 2020-01-22 MED ORDER — AMLODIPINE BESYLATE 10 MG PO TABS: 10.0000 mg | ORAL_TABLET | Freq: Every day | ORAL | 3 refills | Status: DC

## 2020-01-22 MED ORDER — DOXYCYCLINE HYCLATE 50 MG PO CAPS: 200.0000 mg | ORAL_CAPSULE | Freq: Once | ORAL | 0 refills | Status: AC

## 2020-01-22 NOTE — Progress Notes (Signed)
   CC: Mole evaluation, high blood pressure, and anxiety  HPI:Kellie Escobar is a 79 y.o. female who presents for evaluation of a mole, high blood pressure and anxiety. Patient mole was a tick attached to her back. Please see individual problem based A/P for details.   Past Medical History:  Diagnosis Date  . Dementia (HCC)   . History of loop recorder   . Hypertension   . Stroke [facility])    Review of Systems:  ROS negative except as per HPI.  Physical Exam: Vitals:   01/22/20 1600  BP: (!) 163/81  Pulse: 76  Temp: 97.8 F (36.6 C)  TempSrc: Oral  SpO2: 98%  Weight: 102 lb 3.2 oz (46.4 kg)  Height: 4\' 11"  (1.499 m)    General: NAD, nl appearance Cardiovascular: Normal rate, regular rhythm.  No murmurs, rubs, or gallops Pulmonary : Effort normal, breath sounds normal. No wheezes, rales, or rhonchi Abdominal: soft, nontender,  bowel sounds present Musculoskeletal: no swelling , deformity, injury ,or tenderness in extremities, Skin: Warm, dry , tick on left side up upper back, mild erythema at site of bite, no target rash Psychiatric/Behavioral:  normal mood, normal behavior   Assessment & Plan:   See Encounters Tab for problem based charting.  Patient discussed with Dr. 

## 2020-01-22 NOTE — Patient Instructions (Addendum)
Thank you for trusting me with your care. To recap, today we discussed the following:   Tick Tick was removed - Take 200mg  of doxycycline once  High Blood Pressure - We went up on Amlodipine to 10 mg  I sent referral to podiatry.  Sleep I recommend taking your Xanax at bedtime to help with  Sleep.     We can follow up in 2- 3 weeks to discuss further problems.  My best,  , MD

## 2020-01-23 ENCOUNTER — Encounter: Payer: Self-pay | Admitting: Internal Medicine

## 2020-01-23 DIAGNOSIS — B351 Tinea unguium: Secondary | ICD-10-CM | POA: Insufficient documentation

## 2020-01-23 DIAGNOSIS — W57XXXA Bitten or stung by nonvenomous insect and other nonvenomous arthropods, initial encounter: Secondary | ICD-10-CM | POA: Insufficient documentation

## 2020-01-23 DIAGNOSIS — S30860A Insect bite (nonvenomous) of lower back and pelvis, initial encounter: Secondary | ICD-10-CM | POA: Insufficient documentation

## 2020-01-23 NOTE — Assessment & Plan Note (Signed)
BP 163/81 at todays visit. Son reports mother took medications this morning. He says the home health nurse was helping with medications, but he is setting out her medications now. I asked him to monitor her pressure at home and keep a log . Plan Increase Amlodipine from 5 mg to 10 mg Continue Losartan 100 mg

## 2020-01-23 NOTE — Assessment & Plan Note (Signed)
Patient presents for evaluation of a mole which she noticed approximately 4 days ago. Her son also saw the mole and applied alcohol to help with itching. On evaluation patient actually had a tick which was not engorged.   Plan: Tick was removed intact Doxycycline 200 mg once for prophylaxis against lyme disease in setting of duration patient was bitten and species (Ixodes scapularis, picture lost during upload)

## 2020-01-23 NOTE — Progress Notes (Signed)
Internal Medicine Clinic Attending ° °Case discussed with Dr. Steen  at the time of the visit.  We reviewed the resident’s history and exam and pertinent patient test results.  I agree with the assessment, diagnosis, and plan of care documented in the resident’s note.  °

## 2020-01-23 NOTE — Assessment & Plan Note (Signed)
Patient present with chronic onychomycosis and has difficulty trimming her own nails due to thickness. Causing her distress and pain with ambulation  Plan: Referral to podiatry

## 2020-01-23 NOTE — Assessment & Plan Note (Signed)
Son reports patient is depressed and having difficulty sleeping. Suggested Melatonin at last visit, but patient had "energizing response" per son. She has been on Xanax for many years and resistant to weaning. Son reports patient usually sleeps well during the day after taking xanax. Discussed staying up during the day and resting at night. Suggested not giving xanaz during the day, but patient's son is concerned she would have too much anxiety. Will continue to visit this subject on next visit if patient is having problems with sleep.  Plan: Continue Memantine, donepezil, zoloft , and xanax

## 2020-02-12 ENCOUNTER — Other Ambulatory Visit: Payer: Self-pay

## 2020-02-12 ENCOUNTER — Ambulatory Visit: Payer: Medicare Other | Admitting: Podiatry

## 2020-02-12 DIAGNOSIS — M79609 Pain in unspecified limb: Secondary | ICD-10-CM

## 2020-02-12 DIAGNOSIS — B351 Tinea unguium: Secondary | ICD-10-CM | POA: Diagnosis not present

## 2020-02-12 DIAGNOSIS — D689 Coagulation defect, unspecified: Secondary | ICD-10-CM | POA: Diagnosis not present

## 2020-02-12 DIAGNOSIS — M79676 Pain in unspecified toe(s): Secondary | ICD-10-CM

## 2020-02-12 NOTE — Progress Notes (Signed)
  Subjective:  Patient ID: Kellie Escobar, female    DOB: 26-May-1941,  MRN: [id number]  Chief Complaint  Patient presents with  . Nail Problem    BL hallux very sore, loose and thick x 6 mo; very sore -pt denies redness/swelling/driange Tx:  none     79 y.o. female presents with the above complaint. History confirmed with patient.   Objective:  Physical Exam: warm, good capillary refill, nail exam onychomycosis of the toenails, no trophic changes or ulcerative lesions. DP pulses palpable, PT pulses palpable and protective sensation intact Left Foot: normal exam, no swelling, tenderness, instability; ligaments intact, full range of motion of all ankle/foot joints  Right Foot: normal exam, no swelling, tenderness, instability; ligaments intact, full range of motion of all ankle/foot joints   No images are attached to the encounter.  Assessment:   1. Pain due to onychomycosis of nail   2. Coagulation defect [facility])    Plan:  Patient was evaluated and treated and all questions answered.  Onychomycosis and Coagulation Defect -Nails palliatively debrided secondary to pain  Procedure: Nail Debridement Rationale: Patient meets criteria for routine foot care due to coag defect Type of Debridement: manual, sharp debridement. Instrumentation: Nail nipper, rotary burr. Number of Nails: 10  Return if symptoms worsen or fail to improve.

## 2020-02-20 ENCOUNTER — Telehealth: Payer: Self-pay

## 2020-02-20 ENCOUNTER — Other Ambulatory Visit: Payer: Self-pay | Admitting: *Deleted

## 2020-02-20 DIAGNOSIS — R3981 Functional urinary incontinence: Secondary | ICD-10-CM

## 2020-02-20 MED ORDER — SOLIFENACIN SUCCINATE 5 MG PO TABS: 5.0000 mg | ORAL_TABLET | Freq: Every day | ORAL | 3 refills | Status: DC

## 2020-02-20 NOTE — Telephone Encounter (Signed)
Requesting to speak with a nurse about meds, please call back.  

## 2020-02-20 NOTE — Telephone Encounter (Signed)
Called pharmacy before speaking w/ son. Pt does not need any refills. rtc to son, he states pt has redness, swelling and itching at tick bite site- tick removed 3/29. He stated it was supposed to be sent off and analyzed, informed him no it was not. He then was given appt thurs 4/29 pm [facility]. He then stated she needed refills and wanted to go over all meds, he was informed that nurse spoke w/ pharmacy and pt has refills on all meds

## 2020-02-20 NOTE — Telephone Encounter (Signed)
The son has called 4 times in 2hrs 20 mins, dr Barbaraann Faster wanted you to know this

## 2020-02-20 NOTE — Telephone Encounter (Signed)
Patient was given doxycycline for prophylaxis. If patient needs to be seen then son should make an appointment in [facility] for her. I will address the repeated call with patient's son at next visit. Please let me know I can be of further assistance.

## 2020-02-21 ENCOUNTER — Ambulatory Visit: Payer: Medicare Other

## 2020-02-22 ENCOUNTER — Ambulatory Visit (INDEPENDENT_AMBULATORY_CARE_PROVIDER_SITE_OTHER): Payer: Medicare Other | Admitting: Internal Medicine

## 2020-02-22 VITALS — BP 161/71 | HR 66 | Temp 98.3°F | Ht 59.0 in | Wt 100.3 lb

## 2020-02-22 DIAGNOSIS — L02212 Cutaneous abscess of back [any part, except buttock]: Secondary | ICD-10-CM | POA: Diagnosis not present

## 2020-02-22 DIAGNOSIS — S30860D Insect bite (nonvenomous) of lower back and pelvis, subsequent encounter: Secondary | ICD-10-CM | POA: Diagnosis not present

## 2020-02-22 DIAGNOSIS — F32A Depression, unspecified: Secondary | ICD-10-CM

## 2020-02-22 DIAGNOSIS — F329 Major depressive disorder, single episode, unspecified: Secondary | ICD-10-CM

## 2020-02-22 DIAGNOSIS — L0291 Cutaneous abscess, unspecified: Secondary | ICD-10-CM | POA: Insufficient documentation

## 2020-02-22 DIAGNOSIS — W57XXXD Bitten or stung by nonvenomous insect and other nonvenomous arthropods, subsequent encounter: Secondary | ICD-10-CM | POA: Diagnosis not present

## 2020-02-22 DIAGNOSIS — F0393 Unspecified dementia, unspecified severity, with mood disturbance: Secondary | ICD-10-CM

## 2020-02-22 DIAGNOSIS — E46 Unspecified protein-calorie malnutrition: Secondary | ICD-10-CM | POA: Insufficient documentation

## 2020-02-22 DIAGNOSIS — F0280 Dementia in other diseases classified elsewhere without behavioral disturbance: Secondary | ICD-10-CM

## 2020-02-22 MED ORDER — DOXYCYCLINE HYCLATE 100 MG PO TABS: 100.0000 mg | ORAL_TABLET | Freq: Two times a day (BID) | ORAL | 0 refills | Status: AC

## 2020-02-22 NOTE — Progress Notes (Signed)
   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 female with PMH below.  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 Dementia (HCC)   . History of loop recorder   . Hypertension   . Stroke [facility])    Review of Systems:  ROS: Pulmonary: pt denies increased work of breathing, shortness of breath,  Cardiac: pt denies palpitations, chest pain,  Abdominal: pt denies abdominal pain, nausea, vomiting, or diarrhea   Physical Exam:  Vitals:   02/22/20 1420  BP: (!) 161/71  Pulse: 66  Temp: 98.3 F (36.8 C)  TempSrc: Oral  SpO2: 99%  Weight: 100 lb 4.8 oz (45.5 kg)  Height: 4\' 11"  (1.499 m)   Cardiac:  normal rate and rhythm, clear s1 and s2, no murmurs, rubs or gallops, Pulmonary: CTAB, not in distress Skin: small erythematous indurated area at site of previous tick bite just lateral to the midline back at around T6    Social History   Socioeconomic History  . Marital status: Married    Spouse name: Not on file  . Number of children: Not on file  . Years of education: Not on file  . Highest education level: Not on file  Occupational History  . Not on file  Tobacco Use  . Smoking status: Never Smoker  . Smokeless tobacco: Never Used  Substance and Sexual Activity  . Alcohol use: Never  . Drug use: Never  . Sexual activity: Not on file  Other Topics Concern  . Not on file  Social History Narrative  . Not on file   Social Determinants of Health   Financial Resource Strain:   . Difficulty of Paying Living Expenses:   Food Insecurity:   . Worried About in the Last Year:   . Programme researcher, broadcasting/film/video in the Last Year:   Transportation Needs:   . Barista (Medical):   Freight forwarder Lack of  Transportation (Non-Medical):   Physical Activity:   . Days of Exercise per Week:   . Minutes of Exercise per Session:   Stress:   . Feeling of Stress :   Social Connections:   . Frequency of Communication with Friends and Family:   . Frequency of Social Gatherings with Friends and Family:   . Attends Religious Services:   . Active Member of Clubs or Organizations:   . Attends Marland Kitchen Meetings:   Banker Marital Status:   Intimate Partner Violence:   . Fear of Current or Ex-Partner:   . Emotionally Abused:   Marland Kitchen Physically Abused:   . Sexually Abused:     Family History  Family history unknown: Yes    Assessment & Plan:   See Encounters Tab for problem based charting.  Patient discussed with Dr. Marland Kitchen

## 2020-02-22 NOTE — Patient Instructions (Signed)
Ms. Gilchrest, I have peformed an incision and drainage procedure on your back today.  I have also prescribed a short course of antibiotics.  I have referred you to our nutritionist and our behavioral health specialist. If you need to talk to someone about feeling unsafe at home please call: [phone]

## 2020-02-23 DIAGNOSIS — F0393 Unspecified dementia, unspecified severity, with mood disturbance: Secondary | ICD-10-CM | POA: Insufficient documentation

## 2020-02-23 DIAGNOSIS — F0280 Dementia in other diseases classified elsewhere without behavioral disturbance: Secondary | ICD-10-CM | POA: Insufficient documentation

## 2020-02-23 DIAGNOSIS — F32A Depression, unspecified: Secondary | ICD-10-CM | POA: Insufficient documentation

## 2020-02-23 NOTE — Assessment & Plan Note (Signed)
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  She is not able to fill out her PHQ-9 appropriately today her son attempted to do it for her.  She is on maximum dose of zoloft currently.  She may benefit from CBT therapy.  -referral to [facility]

## 2020-02-23 NOTE — Progress Notes (Signed)
Internal Medicine Clinic Attending  Case discussed with Dr. Winfrey  at the time of the visit.  We reviewed the resident's history and exam and pertinent patient test results.  I agree with the assessment, diagnosis, and plan of care documented in the resident's note.  

## 2020-02-23 NOTE — Assessment & Plan Note (Signed)
Follow up appointment for tick bite, she has developed a small subsequent infection of the area.  -treat with I and D and short course of doxy

## 2020-02-23 NOTE — Assessment & Plan Note (Addendum)
Small indurated area on patients back.  Attempted I and D procedure, minimal drainage of area.    -prescribed short course of doxy, I expect this will resolve after treatment

## 2020-02-23 NOTE — Assessment & Plan Note (Signed)
Patient eating around one meal daily.  Son is very concerned about her weight loss and access to food.  He asks about coupons for boost shakes. She cannot make her own food currently and is dependant on her husband and they are having problems at home.     -referral to nutritionist

## 2020-02-23 NOTE — Progress Notes (Signed)
Incison & Drainage Procedure Note Procedure - Incision and Drainage  Patient positioned appropriately, 2 cc lidocaine was used as a local anesthetic. Small incision made with minimal clear drainage.   Procedure tolerated without complications. Wound dressed regular sterile bandaid

## 2020-03-01 ENCOUNTER — Other Ambulatory Visit: Payer: Self-pay | Admitting: Internal Medicine

## 2020-03-06 ENCOUNTER — Encounter: Payer: Medicare Other | Admitting: Internal Medicine

## 2020-03-13 ENCOUNTER — Telehealth: Payer: Self-pay | Admitting: Licensed Clinical Social Worker

## 2020-03-13 NOTE — Telephone Encounter (Signed)
Patient's son answered. Patient's son did not schedule due to waiting to know when the patient will come in to see her PCP. Patient's son was advised to go ahead and schedule with me due to my schedule filling up. Patient's son will be called will be called again next week.

## 2020-03-13 NOTE — Telephone Encounter (Signed)
Patient's son was called, and discussed the referral for him.

## 2020-03-14 ENCOUNTER — Telehealth: Payer: Self-pay | Admitting: Dietician

## 2020-03-14 NOTE — Telephone Encounter (Signed)
Wt Readings from Last 10 Encounters:  02/22/20 100 lb 4.8 oz (45.5 kg)  01/22/20 102 lb 3.2 oz (46.4 kg)  12/06/19 104 lb 11.2 oz (47.5 kg)  09/11/19 111 lb 3.2 oz (50.4 kg)  08/19/19 109 lb 2 oz (49.5 kg)   Called patient's son as a response to a referral from Dr. Frances Furbish because son is concerned about his mother's weight loss. Her son reports high stress, he reports her weight declined from 138#-117# but uncertain over what time persiod that occurred. He states she is  not hungry, takes a multivitiman, drinks unsweet tea, Gatorade and water and has had some supplements, however she does not like them.  He would like information about meals on wheels, meal planning for weight gain mailed to him and possibly assistance in signing his mother up for services.  His address is:  [address]ell[address] Escobar [address] [place], [place] [address] [name], RD 03/14/2020 4:54 PM.

## 2020-04-03 ENCOUNTER — Encounter: Payer: Self-pay | Admitting: Licensed Clinical Social Worker

## 2020-04-03 ENCOUNTER — Telehealth: Payer: Self-pay | Admitting: Licensed Clinical Social Worker

## 2020-04-03 NOTE — Telephone Encounter (Signed)
Patient was called back a second time. No answer, and no vm available. A letter will be mailed with a scheduled appointment, and the patient will be given the option to cancel the appointment if it does not work for them.

## 2020-04-03 NOTE — Telephone Encounter (Signed)
Patient was called on both numbers in chart. One was not working, and the other did not have a vm option. A letter will be mailed today to discuss a referral made by the patient's doctor.

## 2020-04-23 ENCOUNTER — Telehealth: Payer: Self-pay | Admitting: Internal Medicine

## 2020-04-24 ENCOUNTER — Ambulatory Visit: Payer: Medicare Other | Admitting: Licensed Clinical Social Worker

## 2020-05-07 ENCOUNTER — Ambulatory Visit (INDEPENDENT_AMBULATORY_CARE_PROVIDER_SITE_OTHER): Payer: Medicare Other | Admitting: Internal Medicine

## 2020-05-07 ENCOUNTER — Other Ambulatory Visit: Payer: Self-pay

## 2020-05-07 ENCOUNTER — Encounter: Payer: Self-pay | Admitting: Internal Medicine

## 2020-05-07 ENCOUNTER — Other Ambulatory Visit: Payer: Self-pay | Admitting: Internal Medicine

## 2020-05-07 VITALS — BP 168/78 | HR 69 | Temp 98.2°F | Ht 59.0 in | Wt 104.9 lb

## 2020-05-07 DIAGNOSIS — N3000 Acute cystitis without hematuria: Secondary | ICD-10-CM | POA: Diagnosis not present

## 2020-05-07 LAB — POCT URINALYSIS DIPSTICK
Bilirubin, UA: NEGATIVE
Blood, UA: NEGATIVE
Glucose, UA: NEGATIVE
Ketones, UA: NEGATIVE
Protein, UA: NEGATIVE
Spec Grav, UA: 1.025 (ref 1.010–1.025)
Urobilinogen, UA: 0.2 E.U./dL
pH, UA: 6 (ref 5.0–8.0)

## 2020-05-07 MED ORDER — CIPROFLOXACIN HCL 250 MG PO TABS
250.0000 mg | ORAL_TABLET | Freq: Two times a day (BID) | ORAL | 0 refills | Status: AC
Start: 2020-05-07 — End: 2020-05-10

## 2020-05-07 NOTE — Patient Instructions (Signed)
Ms. Kellie Escobar, Today we discussed your bladder issues. It looks like your urine is infected.  Please take this antibiotic twice daily for 3 days which should help.   You have an appointment with Dr. Barbaraann Faster on August second for general follow-up.   Take care, Dr. Chesley Mires

## 2020-05-08 ENCOUNTER — Encounter: Payer: Self-pay | Admitting: Internal Medicine

## 2020-05-08 DIAGNOSIS — N3000 Acute cystitis without hematuria: Secondary | ICD-10-CM | POA: Insufficient documentation

## 2020-05-08 NOTE — Assessment & Plan Note (Signed)
Patient presents with urinary frequency and increased incontinence from baseline for the past week. She denies any fevers, chills, nausea, decreased appetite, flank pain, or hematuria.   Urinalysis is consistent with acute infection.  She is allergic to first-line agents for acute cystitis, including keflex and bactrim. Per chart review, she has done well with Cipro. Will prescribe 3 day course Ciprofloxacin 250 mg BID.

## 2020-05-08 NOTE — Progress Notes (Signed)
Acute Office Visit  Subjective:    Patient ID: Kellie Escobar, female    DOB: 11/06/1940, 79 y.o.   MRN: [id number]  Chief complaint: urinary frequency, incontinence   HPI Patient is in today for urinary frequency with incontinence. Please see problem based charting for further details.   Past Medical History:  Diagnosis Date  . Dementia (HCC)   . History of loop recorder   . Hypertension   . Stroke [facility])     No past surgical history on file.  Family History  Family history unknown: Yes    Social History   Socioeconomic History  . Marital status: Married    Spouse name: Not on file  . Number of children: Not on file  . Years of education: Not on file  . Highest education level: Not on file  Occupational History  . Not on file  Tobacco Use  . Smoking status: Never Smoker  . Smokeless tobacco: Never Used  Substance and Sexual Activity  . Alcohol use: Never  . Drug use: Never  . Sexual activity: Not on file  Other Topics Concern  . Not on file  Social History Narrative  . Not on file   Social Determinants of Health   Financial Resource Strain:   . Difficulty of Paying Living Expenses:   Food Insecurity:   . Worried About Programme researcher, broadcasting/film/video in the Last Year:   . Barista in the Last Year:   Transportation Needs:   . Freight forwarder (Medical):   Marland Kitchen Lack of Transportation (Non-Medical):   Physical Activity:   . Days of Exercise per Week:   . Minutes of Exercise per Session:   Stress:   . Feeling of Stress :   Social Connections:   . Frequency of Communication with Friends and Family:   . Frequency of Social Gatherings with Friends and Family:   . Attends Religious Services:   . Active Member of Clubs or Organizations:   . Attends Banker Meetings:   Marland Kitchen Marital Status:   Intimate Partner Violence:   . Fear of Current or Ex-Partner:   . Emotionally Abused:   Marland Kitchen Physically Abused:   . Sexually Abused:     Outpatient  Medications Prior to Visit  Medication Sig Dispense Refill  . amLODipine (NORVASC) 10 MG tablet Take 1 tablet (10 mg total) by mouth daily. 30 tablet 3  . clopidogrel (PLAVIX) 75 MG tablet Take 75 mg by mouth daily.    Marland Kitchen donepezil (ARICEPT) 10 MG tablet Take 10 mg by mouth at bedtime.    . fluticasone (FLONASE) 50 MCG/ACT nasal spray Place 1 spray into both nostrils daily as needed for allergies or rhinitis.    Marland Kitchen levothyroxine (SYNTHROID) 25 MCG tablet Take 1.5 tablets (37.5 mcg total) by mouth daily before breakfast. 135 tablet 1  . losartan (COZAAR) 100 MG tablet Take 1 tablet (100 mg total) by mouth daily. 90 tablet 1  . memantine (NAMENDA) 5 MG tablet Take 1 tablet (5 mg total) by mouth daily. 90 tablet 1  . ondansetron (ZOFRAN) 4 MG tablet Take 4 mg by mouth every 8 (eight) hours as needed for nausea or vomiting.    . pantoprazole (PROTONIX) 20 MG tablet Take 1 tablet (20 mg total) by mouth 2 (two) times daily. 180 tablet 1  . potassium chloride SA (KLOR-CON) 20 MEQ tablet Take 1 tablet (20 mEq total) by mouth daily. 90 tablet 1  . pramipexole (  MIRAPEX) 0.5 MG tablet Take 1 tablet (0.5 mg total) by mouth at bedtime. 30 tablet 3  . sertraline (ZOLOFT) 50 MG tablet TAKE 3 TABLETS BY MOUTH EVERY DAY 90 tablet 5  . solifenacin (VESICARE) 5 MG tablet Take 1 tablet (5 mg total) by mouth daily. 90 tablet 3  . ezetimibe (ZETIA) 10 MG tablet Take 10 mg by mouth daily.     No facility-administered medications prior to visit.    Allergies  Allergen Reactions  . Codeine Nausea And Vomiting  . Statins Other (See Comments)    unknown  . Sulfa Antibiotics Hives  . Sulfamethoxazole Hives  . Penicillins Nausea Only, Rash and Nausea And Vomiting    Review of Systems  Constitutional: Negative for appetite change, chills and fever.  Gastrointestinal: Negative for nausea and vomiting.  Genitourinary: Negative for flank pain and hematuria.       Objective:    Physical Exam Constitutional:       General: She is not in acute distress.    Appearance: Normal appearance.  Cardiovascular:     Rate and Rhythm: Normal rate and regular rhythm.  Pulmonary:     Effort: Pulmonary effort is normal.     Breath sounds: Normal breath sounds.  Abdominal:     General: There is no distension.     Palpations: Abdomen is soft.     Tenderness: There is no abdominal tenderness. There is no right CVA tenderness or left CVA tenderness.  Neurological:     Mental Status: She is alert. Mental status is at baseline.  Psychiatric:        Mood and Affect: Mood normal.        Behavior: Behavior normal.     BP (!) 168/78 (BP Location: Right Arm, Patient Position: Sitting)   Pulse 69   Temp 98.2 F (36.8 C) (Oral)   Ht 4\' 11"  (1.499 m)   Wt 104 lb 14.4 oz (47.6 kg)   SpO2 99%   BMI 21.19 kg/m  Wt Readings from Last 3 Encounters:  05/07/20 104 lb 14.4 oz (47.6 kg)  02/22/20 100 lb 4.8 oz (45.5 kg)  01/22/20 102 lb 3.2 oz (46.4 kg)    Health Maintenance Due  Topic Date Due  . Hepatitis C Screening  Never done  . COVID-19 Vaccine (1) Never done  . TETANUS/TDAP  Never done  . DEXA SCAN  Never done  . PNA vac Low Risk Adult (1 of 2 - PCV13) Never done    There are no preventive care reminders to display for this patient.   Lab Results  Component Value Date   TSH 1.430 12/06/2019   Lab Results  Component Value Date   WBC 9.5 08/21/2019   HGB 11.9 (L) 08/21/2019   HCT 36.6 08/21/2019   MCV 84.3 08/21/2019   PLT 263 08/21/2019   Lab Results  Component Value Date   NA 139 09/11/2019   K 3.8 09/11/2019   CO2 20 09/11/2019   GLUCOSE 102 (H) 09/11/2019   BUN 13 09/11/2019   CREATININE 1.06 (H) 09/11/2019   BILITOT 0.4 08/22/2019   ALKPHOS 74 08/22/2019   AST 35 08/22/2019   ALT 44 08/22/2019   PROT 6.1 (L) 08/22/2019   ALBUMIN 3.1 (L) 08/22/2019   CALCIUM 9.5 09/11/2019   ANIONGAP 9 08/22/2019   No results found for: CHOL No results found for: HDL No results found for:  LDLCALC No results found for: TRIG No results found for: CHOLHDL No results  found for: HGBA1C     Assessment & Plan:   Problem List Items Addressed This Visit      Genitourinary   Acute cystitis without hematuria - Primary    Patient presents with urinary frequency and increased incontinence from baseline for the past week. She denies any fevers, chills, nausea, decreased appetite, flank pain, or hematuria.   Urinalysis is consistent with acute infection.  She is allergic to first-line agents for acute cystitis, including keflex and bactrim. Per chart review, she has done well with Cipro. Will prescribe 3 day course Ciprofloxacin 250 mg BID.       Relevant Orders   POCT Urinalysis Dipstick (81002) (Completed)       Meds ordered this encounter  Medications  . ciprofloxacin (CIPRO) 250 MG tablet    Sig: Take 1 tablet (250 mg total) by mouth 2 (two) times daily for 3 days.    Dispense:  6 tablet    Refill:  0     Berdell Hostetler D Estoria Geary, DO

## 2020-05-09 NOTE — Progress Notes (Signed)
Internal Medicine Clinic Attending  Case discussed with Dr. Bloomfield  At the time of the visit.  We reviewed the resident's history and exam and pertinent patient test results.  I agree with the assessment, diagnosis, and plan of care documented in the resident's note.  

## 2020-05-23 NOTE — Addendum Note (Signed)
Addended by: Neomia Dear on: 05/23/2020 06:58 AM   Modules accepted: Orders

## 2020-05-27 ENCOUNTER — Encounter: Payer: Medicare Other | Admitting: Internal Medicine

## 2020-05-28 ENCOUNTER — Encounter: Payer: Self-pay | Admitting: Internal Medicine

## 2020-05-28 ENCOUNTER — Ambulatory Visit (INDEPENDENT_AMBULATORY_CARE_PROVIDER_SITE_OTHER): Payer: Medicare Other | Admitting: Internal Medicine

## 2020-05-28 VITALS — BP 162/87 | HR 70 | Temp 99.4°F | Ht 59.0 in | Wt 105.0 lb

## 2020-05-28 DIAGNOSIS — F01518 Vascular dementia, unspecified severity, with other behavioral disturbance: Secondary | ICD-10-CM

## 2020-05-28 DIAGNOSIS — R3981 Functional urinary incontinence: Secondary | ICD-10-CM

## 2020-05-28 DIAGNOSIS — I10 Essential (primary) hypertension: Secondary | ICD-10-CM

## 2020-05-28 DIAGNOSIS — F0393 Unspecified dementia, unspecified severity, with mood disturbance: Secondary | ICD-10-CM

## 2020-05-28 DIAGNOSIS — F0151 Vascular dementia with behavioral disturbance: Secondary | ICD-10-CM | POA: Diagnosis not present

## 2020-05-28 DIAGNOSIS — E46 Unspecified protein-calorie malnutrition: Secondary | ICD-10-CM | POA: Diagnosis not present

## 2020-05-28 DIAGNOSIS — F0280 Dementia in other diseases classified elsewhere without behavioral disturbance: Secondary | ICD-10-CM

## 2020-05-28 DIAGNOSIS — F329 Major depressive disorder, single episode, unspecified: Secondary | ICD-10-CM | POA: Diagnosis not present

## 2020-05-28 DIAGNOSIS — Z8673 Personal history of transient ischemic attack (TIA), and cerebral infarction without residual deficits: Secondary | ICD-10-CM | POA: Diagnosis not present

## 2020-05-28 DIAGNOSIS — H9193 Unspecified hearing loss, bilateral: Secondary | ICD-10-CM

## 2020-05-28 MED ORDER — SERTRALINE HCL 50 MG PO TABS: 150.0000 mg | ORAL_TABLET | Freq: Every day | ORAL | 3 refills | Status: DC

## 2020-05-28 MED ORDER — SOLIFENACIN SUCCINATE 10 MG PO TABS: 10.0000 mg | ORAL_TABLET | Freq: Every day | ORAL | 3 refills | Status: DC

## 2020-05-28 MED ORDER — CLOPIDOGREL BISULFATE 75 MG PO TABS: 75.0000 mg | ORAL_TABLET | Freq: Every day | ORAL | 3 refills | Status: DC

## 2020-05-28 NOTE — Assessment & Plan Note (Addendum)
Patient referred to nationalist at last visit, but didn't follow up per chart review. Son reports he has been picking up a lot of fast food due to being busy. Patient has gained one pound in the last 2 weeks. Discussed eating nutrional meals multiple times a day. Reports a history of B12 deficiency and anemia. Last lab work was 9 months ago. Plan:  CBC

## 2020-05-28 NOTE — Patient Instructions (Addendum)
Thank you for trusting me with your care. To recap, today we discussed the following:  1. Urinary incontinence, functional  - solifenacin (VESICARE) 10 MG tablet; Take 1 tablet (10 mg total) by mouth daily.  Dispense: 90 tablet; Refill: 3  2. History of stroke  - clopidogrel (PLAVIX) 75 MG tablet; Take 1 tablet (75 mg total) by mouth daily.  Dispense: 90 tablet; Refill: 3  3. 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  Dispense: 270 tablet; Refill: 3  4. Malnutrition, unspecified type (HCC)  - CBC with Diff  5. Essential hypertension  - BMP8+Anion Gap  At the next visit schedule for patient to be seen for pelvic exam. We should discuss sending for hearing testing and possible hearing aids as well.   My best,  Thurmon Fair, MD

## 2020-05-28 NOTE — Progress Notes (Signed)
   CC: overactive bladder and depression due to dementia  HPI:Ms.Florrie Ramires is a 79 y.o. female who presents with her son Iantha Fallen ,caretaker) for follow up after an UTI,  evaluation of overactive bladder, and to discuss depression due to dementia.   Past Medical History:  Diagnosis Date  . Dementia (HCC)   . History of loop recorder   . Hypertension   . Stroke [facility])    Review of Systems:   ROS negative except as mentioned in individual problem based A/P.    Physical Exam: Vitals:   05/28/20 1540  BP: (!) 162/87  Pulse: 70  Temp: 99.4 F (37.4 C)  TempSrc: Oral  SpO2: 97%  Weight: 105 lb (47.6 kg)  Height: 4\' 11"  (1.499 m)    General: Elderly , hearing loss HE: Normocephalic, atraumatic , EOMI, Conjunctivae normal ENT: No congestion, no rhinorrhea, no exudate or erythema  Cardiovascular: Normal rate, regular rhythm.  No murmurs, rubs, or gallops Pulmonary : Effort normal, breath sounds normal. No wheezes, rales, or rhonchi Musculoskeletal: no swelling , deformity, injury ,or tenderness in extremities, Skin: Warm, dry , no bruising, erythema, or rash Psychiatric/Behavioral:  depressed mood, flat affect  Assessment & Plan:   See Encounters Tab for problem based charting.  Patient discussed with Dr. 

## 2020-05-28 NOTE — Assessment & Plan Note (Addendum)
Patient continues to have depresion reported by her son. Son does most of the talking for the patient and she has difficulty hearing. Patient was referred to behavioral health, but son has not followed up per chart. Will continue Memantine, donepezil, Zoloft and xanax. Xanax request is too early and will fill on 8/12.

## 2020-05-28 NOTE — Assessment & Plan Note (Signed)
Patient has been prescribed solifenacin for overactive bladder. She wears depends at night. We will increase dose today, but will need to schedule for pelvic exam. Son describes a prolapse of uncertain etiology. Will asked for patient to be scheduled in a few weeks to evaluate further.

## 2020-05-28 NOTE — Assessment & Plan Note (Signed)
Son reports he didn't give the patient her blood pressure medications this morning. BP elevated . BP Readings from Last 3 Encounters:  05/28/20 (!) 162/87  05/07/20 (!) 168/78  02/22/20 (!) 161/71    Patients BP was elevated at last visit as well, but she had not taken her medications.  Plan: BMP today.  Continue Amlodipine 10 mg Continue Losartan 100 mg

## 2020-05-29 LAB — CBC WITH DIFFERENTIAL/PLATELET
Basophils Absolute: 0 10*3/uL (ref 0.0–0.2)
Basos: 1 %
EOS (ABSOLUTE): 0.2 10*3/uL (ref 0.0–0.4)
Eos: 2 %
Hematocrit: 42.8 % (ref 34.0–46.6)
Hemoglobin: 14 g/dL (ref 11.1–15.9)
Immature Grans (Abs): 0 10*3/uL (ref 0.0–0.1)
Immature Granulocytes: 0 %
Lymphocytes Absolute: 1.7 10*3/uL (ref 0.7–3.1)
Lymphs: 21 %
MCH: 28.6 pg (ref 26.6–33.0)
MCHC: 32.7 g/dL (ref 31.5–35.7)
MCV: 87 fL (ref 79–97)
Monocytes Absolute: 0.6 10*3/uL (ref 0.1–0.9)
Monocytes: 7 %
Neutrophils Absolute: 5.3 10*3/uL (ref 1.4–7.0)
Neutrophils: 69 %
Platelets: 230 10*3/uL (ref 150–450)
RBC: 4.9 x10E6/uL (ref 3.77–5.28)
RDW: 13.8 % (ref 11.7–15.4)
WBC: 7.8 10*3/uL (ref 3.4–10.8)

## 2020-05-29 LAB — BMP8+ANION GAP
Anion Gap: 17 mmol/L (ref 10.0–18.0)
BUN/Creatinine Ratio: 20 (ref 12–28)
BUN: 16 mg/dL (ref 8–27)
CO2: 25 mmol/L (ref 20–29)
Calcium: 9.3 mg/dL (ref 8.7–10.3)
Chloride: 98 mmol/L (ref 96–106)
Creatinine, Ser: 0.79 mg/dL (ref 0.57–1.00)
GFR calc Af Amer: 83 mL/min/{1.73_m2} (ref >59)
GFR calc non Af Amer: 72 mL/min/{1.73_m2} (ref >59)
Glucose: 101 mg/dL — ABNORMAL HIGH (ref 65–99)
Potassium: 3.7 mmol/L (ref 3.5–5.2)
Sodium: 140 mmol/L (ref 134–144)

## 2020-05-29 NOTE — Progress Notes (Signed)
Internal Medicine Clinic Attending  Case discussed with Dr. Steen  At the time of the visit.  We reviewed the resident's history and exam and pertinent patient test results.  I agree with the assessment, diagnosis, and plan of care documented in the resident's note.  

## 2020-05-30 ENCOUNTER — Telehealth: Payer: Self-pay

## 2020-05-30 NOTE — Telephone Encounter (Signed)
Requesting to speak with Dr. Barbaraann Faster. Please call pt's son back.

## 2020-05-31 NOTE — Telephone Encounter (Signed)
Pls son is calling back, pls call

## 2020-06-03 ENCOUNTER — Telehealth: Payer: Self-pay | Admitting: *Deleted

## 2020-06-03 DIAGNOSIS — H9193 Unspecified hearing loss, bilateral: Secondary | ICD-10-CM

## 2020-06-03 NOTE — Telephone Encounter (Signed)
Pt's son calls and requests a letter stating pt is mentally competent to make decisions in making her son power of attorney, he states he and dr Barbaraann Faster have spoken of this. Would like dr Barbaraann Faster to call him

## 2020-06-04 NOTE — Telephone Encounter (Signed)
I spoke to [place] , patient's son , this afternoon. He is asking for me to write a letter to his attorney saying his mother is mentally competent. My recommendation to Kellie Escobar is to have his mother's hearing tested. I have been told she had a hearing aid from [employer] she uses at home, but she misplaced the hearing aid. Kellie Escobar asked if he could purchase another one and I reiterated I would like for Kellie Escobar to have formal testing. Once his mother has been evaluated we can schedule her for office visits to start the evaluation he has requested.

## 2020-06-06 ENCOUNTER — Other Ambulatory Visit: Payer: Self-pay | Admitting: Internal Medicine

## 2020-06-06 DIAGNOSIS — F01518 Vascular dementia, unspecified severity, with other behavioral disturbance: Secondary | ICD-10-CM

## 2020-06-06 DIAGNOSIS — F0151 Vascular dementia with behavioral disturbance: Secondary | ICD-10-CM

## 2020-06-07 DIAGNOSIS — H9193 Unspecified hearing loss, bilateral: Secondary | ICD-10-CM | POA: Insufficient documentation

## 2020-06-07 NOTE — Addendum Note (Signed)
Addended by: Gardenia Phlegm on: 06/07/2020 12:22 PM   Modules accepted: Orders

## 2020-06-07 NOTE — Assessment & Plan Note (Signed)
Patient has a history of hearing loss and used a hearing aid from [place]. Patients son is asking for me to evaluate his mother for competency. We discussed the first step would be to evaluate his mothers hearing loss and she will need hearing aids if recommended.  Plan: Referral to ENT

## 2020-06-09 DIAGNOSIS — R0789 Other chest pain: Secondary | ICD-10-CM | POA: Diagnosis not present

## 2020-06-09 DIAGNOSIS — G9389 Other specified disorders of brain: Secondary | ICD-10-CM | POA: Diagnosis not present

## 2020-06-09 DIAGNOSIS — Z7902 Long term (current) use of antithrombotics/antiplatelets: Secondary | ICD-10-CM | POA: Diagnosis not present

## 2020-06-09 DIAGNOSIS — K449 Diaphragmatic hernia without obstruction or gangrene: Secondary | ICD-10-CM | POA: Diagnosis not present

## 2020-06-09 DIAGNOSIS — R079 Chest pain, unspecified: Secondary | ICD-10-CM | POA: Diagnosis not present

## 2020-06-09 DIAGNOSIS — G459 Transient cerebral ischemic attack, unspecified: Secondary | ICD-10-CM | POA: Diagnosis not present

## 2020-06-09 DIAGNOSIS — Z7982 Long term (current) use of aspirin: Secondary | ICD-10-CM | POA: Diagnosis not present

## 2020-06-09 DIAGNOSIS — Z79899 Other long term (current) drug therapy: Secondary | ICD-10-CM | POA: Diagnosis not present

## 2020-06-09 DIAGNOSIS — E782 Mixed hyperlipidemia: Secondary | ICD-10-CM | POA: Diagnosis not present

## 2020-06-09 DIAGNOSIS — E039 Hypothyroidism, unspecified: Secondary | ICD-10-CM | POA: Diagnosis not present

## 2020-06-09 DIAGNOSIS — R471 Dysarthria and anarthria: Secondary | ICD-10-CM | POA: Diagnosis not present

## 2020-06-09 DIAGNOSIS — I6529 Occlusion and stenosis of unspecified carotid artery: Secondary | ICD-10-CM | POA: Diagnosis not present

## 2020-06-09 DIAGNOSIS — Z8673 Personal history of transient ischemic attack (TIA), and cerebral infarction without residual deficits: Secondary | ICD-10-CM | POA: Diagnosis not present

## 2020-06-09 DIAGNOSIS — R072 Precordial pain: Secondary | ICD-10-CM | POA: Diagnosis not present

## 2020-06-10 DIAGNOSIS — R9431 Abnormal electrocardiogram [ECG] [EKG]: Secondary | ICD-10-CM | POA: Diagnosis not present

## 2020-06-10 DIAGNOSIS — I6389 Other cerebral infarction: Secondary | ICD-10-CM | POA: Diagnosis not present

## 2020-06-10 DIAGNOSIS — R9082 White matter disease, unspecified: Secondary | ICD-10-CM | POA: Diagnosis not present

## 2020-06-10 DIAGNOSIS — I10 Essential (primary) hypertension: Secondary | ICD-10-CM | POA: Diagnosis not present

## 2020-06-25 ENCOUNTER — Other Ambulatory Visit: Payer: Self-pay | Admitting: Internal Medicine

## 2020-06-26 NOTE — Addendum Note (Signed)
Addended by: Neomia Dear on: 06/26/2020 06:17 PM   Modules accepted: Orders

## 2020-07-08 ENCOUNTER — Other Ambulatory Visit: Payer: Self-pay | Admitting: Internal Medicine

## 2020-07-08 DIAGNOSIS — F01518 Vascular dementia, unspecified severity, with other behavioral disturbance: Secondary | ICD-10-CM

## 2020-07-08 MED ORDER — ALPRAZOLAM 0.5 MG PO TABS: 0.5000 mg | ORAL_TABLET | Freq: Three times a day (TID) | ORAL | 2 refills | Status: AC | PRN

## 2020-07-08 NOTE — Telephone Encounter (Signed)
Called [facility] - stated pt's insurance will not accept Dr Nicky Pugh DEA#. I will ask The Attending to re-send Alprazolam rx.

## 2020-07-08 NOTE — Telephone Encounter (Signed)
Pls contact pharmacy regarding medicine; pt is unable to get due physician number

## 2020-07-14 DIAGNOSIS — Z9889 Other specified postprocedural states: Secondary | ICD-10-CM | POA: Diagnosis not present

## 2020-08-09 ENCOUNTER — Other Ambulatory Visit: Payer: Self-pay | Admitting: Internal Medicine

## 2020-08-15 ENCOUNTER — Other Ambulatory Visit: Payer: Self-pay | Admitting: Internal Medicine

## 2020-08-21 ENCOUNTER — Other Ambulatory Visit: Payer: Self-pay | Admitting: Student

## 2020-08-21 ENCOUNTER — Telehealth: Payer: Self-pay | Admitting: Internal Medicine

## 2020-08-21 MED ORDER — PANTOPRAZOLE SODIUM 20 MG PO TBEC: 20.0000 mg | DELAYED_RELEASE_TABLET | Freq: Two times a day (BID) | ORAL | 3 refills | Status: DC

## 2020-08-21 NOTE — Telephone Encounter (Signed)
Returned call to [place] at [facility]. States qty and sig on pantoprazole written 06/25/2020 do not match. If patient should be taking BID, please change qty to 180 tabs. Please send new Rx. Kinnie Feil, BSN, RN-BC

## 2020-08-21 NOTE — Telephone Encounter (Signed)
Please call the patient Pharmacy back to clarify the medication  quantity  pantoprazole (PROTONIX) 20 MG tablet  [id number] - [place], [place] - [address]

## 2020-08-21 NOTE — Telephone Encounter (Signed)
Update quantity and resent script.

## 2020-08-28 DIAGNOSIS — I495 Sick sinus syndrome: Secondary | ICD-10-CM | POA: Diagnosis not present

## 2020-08-28 DIAGNOSIS — I10 Essential (primary) hypertension: Secondary | ICD-10-CM | POA: Diagnosis not present

## 2020-08-28 DIAGNOSIS — I471 Supraventricular tachycardia: Secondary | ICD-10-CM | POA: Diagnosis not present

## 2020-08-28 DIAGNOSIS — K449 Diaphragmatic hernia without obstruction or gangrene: Secondary | ICD-10-CM | POA: Diagnosis not present

## 2020-09-04 DIAGNOSIS — Z01812 Encounter for preprocedural laboratory examination: Secondary | ICD-10-CM | POA: Diagnosis not present

## 2020-09-04 DIAGNOSIS — R55 Syncope and collapse: Secondary | ICD-10-CM | POA: Diagnosis not present

## 2020-09-06 DIAGNOSIS — R1311 Dysphagia, oral phase: Secondary | ICD-10-CM | POA: Diagnosis not present

## 2020-09-06 DIAGNOSIS — J9601 Acute respiratory failure with hypoxia: Secondary | ICD-10-CM | POA: Diagnosis not present

## 2020-09-06 DIAGNOSIS — R29810 Facial weakness: Secondary | ICD-10-CM | POA: Diagnosis not present

## 2020-09-06 DIAGNOSIS — I110 Hypertensive heart disease with heart failure: Secondary | ICD-10-CM | POA: Diagnosis not present

## 2020-09-06 DIAGNOSIS — Z781 Physical restraint status: Secondary | ICD-10-CM | POA: Diagnosis not present

## 2020-09-06 DIAGNOSIS — R21 Rash and other nonspecific skin eruption: Secondary | ICD-10-CM | POA: Diagnosis not present

## 2020-09-06 DIAGNOSIS — K219 Gastro-esophageal reflux disease without esophagitis: Secondary | ICD-10-CM | POA: Diagnosis not present

## 2020-09-06 DIAGNOSIS — E039 Hypothyroidism, unspecified: Secondary | ICD-10-CM | POA: Diagnosis not present

## 2020-09-06 DIAGNOSIS — I6523 Occlusion and stenosis of bilateral carotid arteries: Secondary | ICD-10-CM | POA: Diagnosis not present

## 2020-09-06 DIAGNOSIS — I517 Cardiomegaly: Secondary | ICD-10-CM | POA: Diagnosis not present

## 2020-09-06 DIAGNOSIS — J690 Pneumonitis due to inhalation of food and vomit: Secondary | ICD-10-CM | POA: Diagnosis not present

## 2020-09-06 DIAGNOSIS — E785 Hyperlipidemia, unspecified: Secondary | ICD-10-CM | POA: Diagnosis not present

## 2020-09-06 DIAGNOSIS — I495 Sick sinus syndrome: Secondary | ICD-10-CM | POA: Diagnosis not present

## 2020-09-06 DIAGNOSIS — R1314 Dysphagia, pharyngoesophageal phase: Secondary | ICD-10-CM | POA: Diagnosis not present

## 2020-09-06 DIAGNOSIS — I313 Pericardial effusion (noninflammatory): Secondary | ICD-10-CM | POA: Diagnosis not present

## 2020-09-06 DIAGNOSIS — I480 Paroxysmal atrial fibrillation: Secondary | ICD-10-CM | POA: Diagnosis not present

## 2020-09-06 DIAGNOSIS — D649 Anemia, unspecified: Secondary | ICD-10-CM | POA: Diagnosis not present

## 2020-09-06 DIAGNOSIS — I509 Heart failure, unspecified: Secondary | ICD-10-CM | POA: Diagnosis not present

## 2020-09-06 DIAGNOSIS — N3281 Overactive bladder: Secondary | ICD-10-CM | POA: Diagnosis not present

## 2020-09-06 DIAGNOSIS — G459 Transient cerebral ischemic attack, unspecified: Secondary | ICD-10-CM | POA: Diagnosis not present

## 2020-09-06 DIAGNOSIS — R7301 Impaired fasting glucose: Secondary | ICD-10-CM | POA: Diagnosis not present

## 2020-09-06 DIAGNOSIS — I471 Supraventricular tachycardia: Secondary | ICD-10-CM | POA: Diagnosis not present

## 2020-09-06 DIAGNOSIS — K449 Diaphragmatic hernia without obstruction or gangrene: Secondary | ICD-10-CM | POA: Diagnosis not present

## 2020-09-07 DIAGNOSIS — R7301 Impaired fasting glucose: Secondary | ICD-10-CM | POA: Diagnosis not present

## 2020-09-07 DIAGNOSIS — R001 Bradycardia, unspecified: Secondary | ICD-10-CM | POA: Diagnosis not present

## 2020-09-07 DIAGNOSIS — N3281 Overactive bladder: Secondary | ICD-10-CM | POA: Diagnosis not present

## 2020-09-07 DIAGNOSIS — E039 Hypothyroidism, unspecified: Secondary | ICD-10-CM | POA: Diagnosis not present

## 2020-09-07 DIAGNOSIS — E785 Hyperlipidemia, unspecified: Secondary | ICD-10-CM | POA: Diagnosis not present

## 2020-09-07 DIAGNOSIS — J90 Pleural effusion, not elsewhere classified: Secondary | ICD-10-CM | POA: Diagnosis not present

## 2020-09-07 DIAGNOSIS — I6523 Occlusion and stenosis of bilateral carotid arteries: Secondary | ICD-10-CM | POA: Diagnosis not present

## 2020-09-07 DIAGNOSIS — D649 Anemia, unspecified: Secondary | ICD-10-CM | POA: Diagnosis not present

## 2020-09-07 DIAGNOSIS — I495 Sick sinus syndrome: Secondary | ICD-10-CM | POA: Diagnosis not present

## 2020-09-07 DIAGNOSIS — Z781 Physical restraint status: Secondary | ICD-10-CM | POA: Diagnosis not present

## 2020-09-07 DIAGNOSIS — R1314 Dysphagia, pharyngoesophageal phase: Secondary | ICD-10-CM | POA: Diagnosis not present

## 2020-09-07 DIAGNOSIS — R29818 Other symptoms and signs involving the nervous system: Secondary | ICD-10-CM | POA: Diagnosis not present

## 2020-09-07 DIAGNOSIS — I509 Heart failure, unspecified: Secondary | ICD-10-CM | POA: Diagnosis not present

## 2020-09-07 DIAGNOSIS — K449 Diaphragmatic hernia without obstruction or gangrene: Secondary | ICD-10-CM | POA: Diagnosis not present

## 2020-09-07 DIAGNOSIS — G459 Transient cerebral ischemic attack, unspecified: Secondary | ICD-10-CM | POA: Diagnosis not present

## 2020-09-07 DIAGNOSIS — J811 Chronic pulmonary edema: Secondary | ICD-10-CM | POA: Diagnosis not present

## 2020-09-07 DIAGNOSIS — R29810 Facial weakness: Secondary | ICD-10-CM | POA: Diagnosis not present

## 2020-09-07 DIAGNOSIS — R1311 Dysphagia, oral phase: Secondary | ICD-10-CM | POA: Diagnosis not present

## 2020-09-07 DIAGNOSIS — J690 Pneumonitis due to inhalation of food and vomit: Secondary | ICD-10-CM | POA: Diagnosis not present

## 2020-09-07 DIAGNOSIS — J9601 Acute respiratory failure with hypoxia: Secondary | ICD-10-CM | POA: Diagnosis not present

## 2020-09-07 DIAGNOSIS — I110 Hypertensive heart disease with heart failure: Secondary | ICD-10-CM | POA: Diagnosis not present

## 2020-09-07 DIAGNOSIS — I471 Supraventricular tachycardia: Secondary | ICD-10-CM | POA: Diagnosis not present

## 2020-09-07 DIAGNOSIS — R55 Syncope and collapse: Secondary | ICD-10-CM | POA: Diagnosis not present

## 2020-09-07 DIAGNOSIS — Z950 Presence of cardiac pacemaker: Secondary | ICD-10-CM | POA: Diagnosis not present

## 2020-09-07 DIAGNOSIS — I313 Pericardial effusion (noninflammatory): Secondary | ICD-10-CM | POA: Diagnosis not present

## 2020-09-07 DIAGNOSIS — K219 Gastro-esophageal reflux disease without esophagitis: Secondary | ICD-10-CM | POA: Diagnosis not present

## 2020-09-07 DIAGNOSIS — I480 Paroxysmal atrial fibrillation: Secondary | ICD-10-CM | POA: Diagnosis not present

## 2020-09-08 DIAGNOSIS — E039 Hypothyroidism, unspecified: Secondary | ICD-10-CM | POA: Diagnosis not present

## 2020-09-08 DIAGNOSIS — J9690 Respiratory failure, unspecified, unspecified whether with hypoxia or hypercapnia: Secondary | ICD-10-CM | POA: Diagnosis not present

## 2020-09-08 DIAGNOSIS — R21 Rash and other nonspecific skin eruption: Secondary | ICD-10-CM | POA: Diagnosis not present

## 2020-09-08 DIAGNOSIS — I313 Pericardial effusion (noninflammatory): Secondary | ICD-10-CM | POA: Diagnosis not present

## 2020-09-08 DIAGNOSIS — R29810 Facial weakness: Secondary | ICD-10-CM | POA: Diagnosis not present

## 2020-09-08 DIAGNOSIS — R404 Transient alteration of awareness: Secondary | ICD-10-CM | POA: Diagnosis not present

## 2020-09-08 DIAGNOSIS — Z515 Encounter for palliative care: Secondary | ICD-10-CM | POA: Diagnosis not present

## 2020-09-08 DIAGNOSIS — Z781 Physical restraint status: Secondary | ICD-10-CM | POA: Diagnosis not present

## 2020-09-08 DIAGNOSIS — R079 Chest pain, unspecified: Secondary | ICD-10-CM | POA: Diagnosis not present

## 2020-09-08 DIAGNOSIS — R1311 Dysphagia, oral phase: Secondary | ICD-10-CM | POA: Diagnosis not present

## 2020-09-08 DIAGNOSIS — D649 Anemia, unspecified: Secondary | ICD-10-CM | POA: Diagnosis not present

## 2020-09-08 DIAGNOSIS — I6523 Occlusion and stenosis of bilateral carotid arteries: Secondary | ICD-10-CM | POA: Diagnosis not present

## 2020-09-08 DIAGNOSIS — I509 Heart failure, unspecified: Secondary | ICD-10-CM | POA: Diagnosis not present

## 2020-09-08 DIAGNOSIS — R001 Bradycardia, unspecified: Secondary | ICD-10-CM | POA: Diagnosis not present

## 2020-09-08 DIAGNOSIS — G459 Transient cerebral ischemic attack, unspecified: Secondary | ICD-10-CM | POA: Diagnosis not present

## 2020-09-08 DIAGNOSIS — I517 Cardiomegaly: Secondary | ICD-10-CM | POA: Diagnosis not present

## 2020-09-08 DIAGNOSIS — R1314 Dysphagia, pharyngoesophageal phase: Secondary | ICD-10-CM | POA: Diagnosis not present

## 2020-09-08 DIAGNOSIS — I4891 Unspecified atrial fibrillation: Secondary | ICD-10-CM | POA: Diagnosis not present

## 2020-09-08 DIAGNOSIS — J90 Pleural effusion, not elsewhere classified: Secondary | ICD-10-CM | POA: Diagnosis not present

## 2020-09-08 DIAGNOSIS — J690 Pneumonitis due to inhalation of food and vomit: Secondary | ICD-10-CM | POA: Diagnosis not present

## 2020-09-08 DIAGNOSIS — I361 Nonrheumatic tricuspid (valve) insufficiency: Secondary | ICD-10-CM | POA: Diagnosis not present

## 2020-09-08 DIAGNOSIS — Z950 Presence of cardiac pacemaker: Secondary | ICD-10-CM | POA: Diagnosis not present

## 2020-09-08 DIAGNOSIS — M2550 Pain in unspecified joint: Secondary | ICD-10-CM | POA: Diagnosis not present

## 2020-09-08 DIAGNOSIS — R0902 Hypoxemia: Secondary | ICD-10-CM | POA: Diagnosis not present

## 2020-09-08 DIAGNOSIS — J9601 Acute respiratory failure with hypoxia: Secondary | ICD-10-CM | POA: Diagnosis not present

## 2020-09-08 DIAGNOSIS — R000 Tachycardia, unspecified: Secondary | ICD-10-CM | POA: Diagnosis not present

## 2020-09-08 DIAGNOSIS — R29818 Other symptoms and signs involving the nervous system: Secondary | ICD-10-CM | POA: Diagnosis not present

## 2020-09-08 DIAGNOSIS — I480 Paroxysmal atrial fibrillation: Secondary | ICD-10-CM | POA: Diagnosis not present

## 2020-09-08 DIAGNOSIS — R55 Syncope and collapse: Secondary | ICD-10-CM | POA: Diagnosis not present

## 2020-09-08 DIAGNOSIS — E785 Hyperlipidemia, unspecified: Secondary | ICD-10-CM | POA: Diagnosis not present

## 2020-09-08 DIAGNOSIS — I959 Hypotension, unspecified: Secondary | ICD-10-CM | POA: Diagnosis not present

## 2020-09-08 DIAGNOSIS — D72829 Elevated white blood cell count, unspecified: Secondary | ICD-10-CM | POA: Diagnosis not present

## 2020-09-08 DIAGNOSIS — R7301 Impaired fasting glucose: Secondary | ICD-10-CM | POA: Diagnosis not present

## 2020-09-08 DIAGNOSIS — Z7401 Bed confinement status: Secondary | ICD-10-CM | POA: Diagnosis not present

## 2020-09-08 DIAGNOSIS — K219 Gastro-esophageal reflux disease without esophagitis: Secondary | ICD-10-CM | POA: Diagnosis not present

## 2020-09-08 DIAGNOSIS — I110 Hypertensive heart disease with heart failure: Secondary | ICD-10-CM | POA: Diagnosis not present

## 2020-09-08 DIAGNOSIS — N3281 Overactive bladder: Secondary | ICD-10-CM | POA: Diagnosis not present

## 2020-09-08 DIAGNOSIS — R410 Disorientation, unspecified: Secondary | ICD-10-CM | POA: Diagnosis not present

## 2020-09-08 DIAGNOSIS — K449 Diaphragmatic hernia without obstruction or gangrene: Secondary | ICD-10-CM | POA: Diagnosis not present

## 2020-09-08 DIAGNOSIS — I471 Supraventricular tachycardia: Secondary | ICD-10-CM | POA: Diagnosis not present

## 2020-09-08 DIAGNOSIS — Z743 Need for continuous supervision: Secondary | ICD-10-CM | POA: Diagnosis not present

## 2020-09-08 DIAGNOSIS — I495 Sick sinus syndrome: Secondary | ICD-10-CM | POA: Diagnosis not present

## 2020-09-08 DIAGNOSIS — R9431 Abnormal electrocardiogram [ECG] [EKG]: Secondary | ICD-10-CM | POA: Diagnosis not present

## 2020-09-08 DIAGNOSIS — I314 Cardiac tamponade: Secondary | ICD-10-CM | POA: Diagnosis not present

## 2020-09-08 DIAGNOSIS — Z48812 Encounter for surgical aftercare following surgery on the circulatory system: Secondary | ICD-10-CM | POA: Diagnosis not present

## 2020-09-08 DIAGNOSIS — R471 Dysarthria and anarthria: Secondary | ICD-10-CM | POA: Diagnosis not present

## 2020-09-09 DIAGNOSIS — I314 Cardiac tamponade: Secondary | ICD-10-CM | POA: Diagnosis not present

## 2020-09-09 DIAGNOSIS — R471 Dysarthria and anarthria: Secondary | ICD-10-CM | POA: Diagnosis not present

## 2020-09-09 DIAGNOSIS — G459 Transient cerebral ischemic attack, unspecified: Secondary | ICD-10-CM | POA: Diagnosis not present

## 2020-09-09 DIAGNOSIS — R001 Bradycardia, unspecified: Secondary | ICD-10-CM | POA: Diagnosis not present

## 2020-09-09 DIAGNOSIS — R9431 Abnormal electrocardiogram [ECG] [EKG]: Secondary | ICD-10-CM | POA: Diagnosis not present

## 2020-09-09 DIAGNOSIS — R55 Syncope and collapse: Secondary | ICD-10-CM | POA: Diagnosis not present

## 2020-09-10 DIAGNOSIS — I361 Nonrheumatic tricuspid (valve) insufficiency: Secondary | ICD-10-CM | POA: Diagnosis not present

## 2020-09-10 DIAGNOSIS — R55 Syncope and collapse: Secondary | ICD-10-CM | POA: Diagnosis not present

## 2020-09-10 DIAGNOSIS — I4891 Unspecified atrial fibrillation: Secondary | ICD-10-CM | POA: Diagnosis not present

## 2020-09-10 DIAGNOSIS — G459 Transient cerebral ischemic attack, unspecified: Secondary | ICD-10-CM | POA: Diagnosis not present

## 2020-09-10 DIAGNOSIS — Z950 Presence of cardiac pacemaker: Secondary | ICD-10-CM | POA: Diagnosis not present

## 2020-09-11 DIAGNOSIS — J90 Pleural effusion, not elsewhere classified: Secondary | ICD-10-CM | POA: Diagnosis not present

## 2020-09-11 DIAGNOSIS — R0902 Hypoxemia: Secondary | ICD-10-CM | POA: Diagnosis not present

## 2020-09-11 DIAGNOSIS — R21 Rash and other nonspecific skin eruption: Secondary | ICD-10-CM | POA: Diagnosis not present

## 2020-09-11 DIAGNOSIS — K449 Diaphragmatic hernia without obstruction or gangrene: Secondary | ICD-10-CM | POA: Diagnosis not present

## 2020-09-17 DIAGNOSIS — E039 Hypothyroidism, unspecified: Secondary | ICD-10-CM | POA: Diagnosis not present

## 2020-09-17 DIAGNOSIS — E785 Hyperlipidemia, unspecified: Secondary | ICD-10-CM | POA: Diagnosis not present

## 2020-09-17 DIAGNOSIS — R404 Transient alteration of awareness: Secondary | ICD-10-CM | POA: Diagnosis not present

## 2020-09-17 DIAGNOSIS — R1311 Dysphagia, oral phase: Secondary | ICD-10-CM | POA: Diagnosis not present

## 2020-09-17 DIAGNOSIS — Z45018 Encounter for adjustment and management of other part of cardiac pacemaker: Secondary | ICD-10-CM | POA: Diagnosis not present

## 2020-09-17 DIAGNOSIS — M6259 Muscle wasting and atrophy, not elsewhere classified, multiple sites: Secondary | ICD-10-CM | POA: Diagnosis not present

## 2020-09-17 DIAGNOSIS — J690 Pneumonitis due to inhalation of food and vomit: Secondary | ICD-10-CM | POA: Diagnosis not present

## 2020-09-17 DIAGNOSIS — J9601 Acute respiratory failure with hypoxia: Secondary | ICD-10-CM | POA: Diagnosis not present

## 2020-09-17 DIAGNOSIS — Z7401 Bed confinement status: Secondary | ICD-10-CM | POA: Diagnosis not present

## 2020-09-17 DIAGNOSIS — I6523 Occlusion and stenosis of bilateral carotid arteries: Secondary | ICD-10-CM | POA: Diagnosis not present

## 2020-09-17 DIAGNOSIS — E46 Unspecified protein-calorie malnutrition: Secondary | ICD-10-CM | POA: Diagnosis not present

## 2020-09-17 DIAGNOSIS — Z743 Need for continuous supervision: Secondary | ICD-10-CM | POA: Diagnosis not present

## 2020-09-17 DIAGNOSIS — Z950 Presence of cardiac pacemaker: Secondary | ICD-10-CM | POA: Diagnosis not present

## 2020-09-17 DIAGNOSIS — G459 Transient cerebral ischemic attack, unspecified: Secondary | ICD-10-CM | POA: Diagnosis not present

## 2020-09-17 DIAGNOSIS — R55 Syncope and collapse: Secondary | ICD-10-CM | POA: Diagnosis not present

## 2020-09-17 DIAGNOSIS — R5381 Other malaise: Secondary | ICD-10-CM | POA: Diagnosis not present

## 2020-09-17 DIAGNOSIS — M6281 Muscle weakness (generalized): Secondary | ICD-10-CM | POA: Diagnosis not present

## 2020-09-17 DIAGNOSIS — I6930 Unspecified sequelae of cerebral infarction: Secondary | ICD-10-CM | POA: Diagnosis not present

## 2020-09-17 DIAGNOSIS — I495 Sick sinus syndrome: Secondary | ICD-10-CM | POA: Diagnosis not present

## 2020-09-17 DIAGNOSIS — M2550 Pain in unspecified joint: Secondary | ICD-10-CM | POA: Diagnosis not present

## 2020-09-17 DIAGNOSIS — I10 Essential (primary) hypertension: Secondary | ICD-10-CM | POA: Diagnosis not present

## 2020-09-17 DIAGNOSIS — R269 Unspecified abnormalities of gait and mobility: Secondary | ICD-10-CM | POA: Diagnosis not present

## 2020-09-17 DIAGNOSIS — Z48812 Encounter for surgical aftercare following surgery on the circulatory system: Secondary | ICD-10-CM | POA: Diagnosis not present

## 2020-09-18 DIAGNOSIS — J690 Pneumonitis due to inhalation of food and vomit: Secondary | ICD-10-CM | POA: Diagnosis not present

## 2020-09-18 DIAGNOSIS — Z950 Presence of cardiac pacemaker: Secondary | ICD-10-CM | POA: Diagnosis not present

## 2020-09-18 DIAGNOSIS — E039 Hypothyroidism, unspecified: Secondary | ICD-10-CM | POA: Diagnosis not present

## 2020-09-19 ENCOUNTER — Other Ambulatory Visit: Payer: Self-pay | Admitting: Internal Medicine

## 2020-09-24 DIAGNOSIS — J690 Pneumonitis due to inhalation of food and vomit: Secondary | ICD-10-CM | POA: Diagnosis not present

## 2020-09-24 DIAGNOSIS — R5381 Other malaise: Secondary | ICD-10-CM | POA: Diagnosis not present

## 2020-09-24 DIAGNOSIS — E785 Hyperlipidemia, unspecified: Secondary | ICD-10-CM | POA: Diagnosis not present

## 2020-09-25 ENCOUNTER — Telehealth: Payer: Self-pay

## 2020-09-25 NOTE — Telephone Encounter (Signed)
Received TC from patient's son, Iantha Fallen, who is requesting a copy of patient's current med list to be faxed to [facility] in [place], [place].  RN informed patient due to HIPPA guidelines, RN unable to fax medlist, but will mail a copy of medlist to patient's home address for patient. Son states patient had a "pacemaker placed and will be going into rehab at [facility] in [place], [place] and to let PCP know that patient may not need his services any longer, but they are appreciative of [facility]".  Son also states that his dad is now in a homeless shelter. Will forward to pcp. SChaplin, RN,BSN

## 2020-09-26 NOTE — Telephone Encounter (Signed)
Thank you , I have reviewed note.

## 2020-10-03 DIAGNOSIS — Z45018 Encounter for adjustment and management of other part of cardiac pacemaker: Secondary | ICD-10-CM | POA: Diagnosis not present

## 2020-10-03 DIAGNOSIS — I495 Sick sinus syndrome: Secondary | ICD-10-CM | POA: Diagnosis not present

## 2020-10-16 DIAGNOSIS — R531 Weakness: Secondary | ICD-10-CM | POA: Diagnosis not present

## 2020-10-16 DIAGNOSIS — R1310 Dysphagia, unspecified: Secondary | ICD-10-CM | POA: Diagnosis not present

## 2020-10-16 DIAGNOSIS — I10 Essential (primary) hypertension: Secondary | ICD-10-CM | POA: Diagnosis not present

## 2020-10-16 DIAGNOSIS — Z950 Presence of cardiac pacemaker: Secondary | ICD-10-CM | POA: Diagnosis not present

## 2020-10-16 DIAGNOSIS — I6782 Cerebral ischemia: Secondary | ICD-10-CM | POA: Diagnosis not present

## 2020-10-16 DIAGNOSIS — Z66 Do not resuscitate: Secondary | ICD-10-CM | POA: Diagnosis not present

## 2020-10-16 DIAGNOSIS — E440 Moderate protein-calorie malnutrition: Secondary | ICD-10-CM | POA: Diagnosis not present

## 2020-10-16 DIAGNOSIS — J9811 Atelectasis: Secondary | ICD-10-CM | POA: Diagnosis not present

## 2020-10-16 DIAGNOSIS — R4781 Slurred speech: Secondary | ICD-10-CM | POA: Diagnosis not present

## 2020-10-16 DIAGNOSIS — R410 Disorientation, unspecified: Secondary | ICD-10-CM | POA: Diagnosis not present

## 2020-10-16 DIAGNOSIS — D509 Iron deficiency anemia, unspecified: Secondary | ICD-10-CM | POA: Diagnosis not present

## 2020-10-16 DIAGNOSIS — N390 Urinary tract infection, site not specified: Secondary | ICD-10-CM | POA: Diagnosis not present

## 2020-10-16 DIAGNOSIS — R479 Unspecified speech disturbances: Secondary | ICD-10-CM | POA: Diagnosis not present

## 2020-10-16 DIAGNOSIS — Z743 Need for continuous supervision: Secondary | ICD-10-CM | POA: Diagnosis not present

## 2020-10-16 DIAGNOSIS — G9341 Metabolic encephalopathy: Secondary | ICD-10-CM | POA: Diagnosis not present

## 2020-10-16 DIAGNOSIS — R52 Pain, unspecified: Secondary | ICD-10-CM | POA: Diagnosis not present

## 2020-10-16 DIAGNOSIS — R0902 Hypoxemia: Secondary | ICD-10-CM | POA: Diagnosis not present

## 2020-10-16 DIAGNOSIS — K449 Diaphragmatic hernia without obstruction or gangrene: Secondary | ICD-10-CM | POA: Diagnosis not present

## 2020-10-16 DIAGNOSIS — R7401 Elevation of levels of liver transaminase levels: Secondary | ICD-10-CM | POA: Diagnosis not present

## 2020-10-16 DIAGNOSIS — E039 Hypothyroidism, unspecified: Secondary | ICD-10-CM | POA: Diagnosis not present

## 2020-10-16 DIAGNOSIS — K219 Gastro-esophageal reflux disease without esophagitis: Secondary | ICD-10-CM | POA: Diagnosis not present

## 2020-10-16 DIAGNOSIS — Z8719 Personal history of other diseases of the digestive system: Secondary | ICD-10-CM | POA: Diagnosis not present

## 2020-10-16 DIAGNOSIS — I480 Paroxysmal atrial fibrillation: Secondary | ICD-10-CM | POA: Diagnosis not present

## 2020-10-16 DIAGNOSIS — I69991 Dysphagia following unspecified cerebrovascular disease: Secondary | ICD-10-CM | POA: Diagnosis not present

## 2020-10-16 DIAGNOSIS — R4020 Unspecified coma: Secondary | ICD-10-CM | POA: Diagnosis not present

## 2020-10-16 DIAGNOSIS — N179 Acute kidney failure, unspecified: Secondary | ICD-10-CM | POA: Diagnosis not present

## 2020-10-16 DIAGNOSIS — Z885 Allergy status to narcotic agent status: Secondary | ICD-10-CM | POA: Diagnosis not present

## 2020-10-16 DIAGNOSIS — B9689 Other specified bacterial agents as the cause of diseases classified elsewhere: Secondary | ICD-10-CM | POA: Diagnosis not present

## 2020-10-16 DIAGNOSIS — F32A Depression, unspecified: Secondary | ICD-10-CM | POA: Diagnosis not present

## 2020-10-16 DIAGNOSIS — E7800 Pure hypercholesterolemia, unspecified: Secondary | ICD-10-CM | POA: Diagnosis not present

## 2020-10-16 DIAGNOSIS — Z882 Allergy status to sulfonamides status: Secondary | ICD-10-CM | POA: Diagnosis not present

## 2020-10-16 DIAGNOSIS — R279 Unspecified lack of coordination: Secondary | ICD-10-CM | POA: Diagnosis not present

## 2020-10-16 DIAGNOSIS — Z880 Allergy status to penicillin: Secondary | ICD-10-CM | POA: Diagnosis not present

## 2020-10-17 DIAGNOSIS — N179 Acute kidney failure, unspecified: Secondary | ICD-10-CM | POA: Diagnosis not present

## 2020-10-17 DIAGNOSIS — G9341 Metabolic encephalopathy: Secondary | ICD-10-CM | POA: Diagnosis not present

## 2020-10-17 DIAGNOSIS — N390 Urinary tract infection, site not specified: Secondary | ICD-10-CM | POA: Diagnosis not present

## 2020-10-18 DIAGNOSIS — G9341 Metabolic encephalopathy: Secondary | ICD-10-CM | POA: Diagnosis not present

## 2020-10-18 DIAGNOSIS — N179 Acute kidney failure, unspecified: Secondary | ICD-10-CM | POA: Diagnosis not present

## 2020-10-18 DIAGNOSIS — N390 Urinary tract infection, site not specified: Secondary | ICD-10-CM | POA: Diagnosis not present

## 2020-10-19 DIAGNOSIS — N179 Acute kidney failure, unspecified: Secondary | ICD-10-CM | POA: Diagnosis not present

## 2020-10-19 DIAGNOSIS — G9341 Metabolic encephalopathy: Secondary | ICD-10-CM | POA: Diagnosis not present

## 2020-10-19 DIAGNOSIS — N390 Urinary tract infection, site not specified: Secondary | ICD-10-CM | POA: Diagnosis not present

## 2020-10-20 DIAGNOSIS — N390 Urinary tract infection, site not specified: Secondary | ICD-10-CM | POA: Diagnosis not present

## 2020-10-20 DIAGNOSIS — N179 Acute kidney failure, unspecified: Secondary | ICD-10-CM | POA: Diagnosis not present

## 2020-10-20 DIAGNOSIS — G9341 Metabolic encephalopathy: Secondary | ICD-10-CM | POA: Diagnosis not present

## 2020-10-21 DIAGNOSIS — N390 Urinary tract infection, site not specified: Secondary | ICD-10-CM | POA: Diagnosis not present

## 2020-10-21 DIAGNOSIS — R410 Disorientation, unspecified: Secondary | ICD-10-CM | POA: Diagnosis not present

## 2020-10-21 DIAGNOSIS — Z743 Need for continuous supervision: Secondary | ICD-10-CM | POA: Diagnosis not present

## 2020-10-21 DIAGNOSIS — G9341 Metabolic encephalopathy: Secondary | ICD-10-CM | POA: Diagnosis not present

## 2020-10-21 DIAGNOSIS — N179 Acute kidney failure, unspecified: Secondary | ICD-10-CM | POA: Diagnosis not present

## 2020-10-21 DIAGNOSIS — R279 Unspecified lack of coordination: Secondary | ICD-10-CM | POA: Diagnosis not present

## 2020-10-21 DIAGNOSIS — R0902 Hypoxemia: Secondary | ICD-10-CM | POA: Diagnosis not present

## 2020-10-24 DIAGNOSIS — N390 Urinary tract infection, site not specified: Secondary | ICD-10-CM | POA: Diagnosis not present

## 2020-10-29 DIAGNOSIS — N390 Urinary tract infection, site not specified: Secondary | ICD-10-CM | POA: Diagnosis not present

## 2020-10-29 DIAGNOSIS — Z8673 Personal history of transient ischemic attack (TIA), and cerebral infarction without residual deficits: Secondary | ICD-10-CM | POA: Diagnosis not present

## 2020-10-29 DIAGNOSIS — R5381 Other malaise: Secondary | ICD-10-CM | POA: Diagnosis not present

## 2020-11-07 ENCOUNTER — Other Ambulatory Visit: Payer: Self-pay | Admitting: Internal Medicine

## 2020-11-29 DIAGNOSIS — E039 Hypothyroidism, unspecified: Secondary | ICD-10-CM | POA: Diagnosis not present

## 2020-11-29 DIAGNOSIS — I119 Hypertensive heart disease without heart failure: Secondary | ICD-10-CM | POA: Diagnosis not present

## 2020-11-29 DIAGNOSIS — E785 Hyperlipidemia, unspecified: Secondary | ICD-10-CM | POA: Diagnosis not present

## 2020-12-10 DIAGNOSIS — J690 Pneumonitis due to inhalation of food and vomit: Secondary | ICD-10-CM | POA: Diagnosis not present

## 2020-12-10 DIAGNOSIS — M6259 Muscle wasting and atrophy, not elsewhere classified, multiple sites: Secondary | ICD-10-CM | POA: Diagnosis not present

## 2020-12-10 DIAGNOSIS — Z48812 Encounter for surgical aftercare following surgery on the circulatory system: Secondary | ICD-10-CM | POA: Diagnosis not present

## 2020-12-10 DIAGNOSIS — R1311 Dysphagia, oral phase: Secondary | ICD-10-CM | POA: Diagnosis not present

## 2020-12-10 DIAGNOSIS — E039 Hypothyroidism, unspecified: Secondary | ICD-10-CM | POA: Diagnosis not present

## 2020-12-10 DIAGNOSIS — J9601 Acute respiratory failure with hypoxia: Secondary | ICD-10-CM | POA: Diagnosis not present

## 2020-12-10 DIAGNOSIS — I10 Essential (primary) hypertension: Secondary | ICD-10-CM | POA: Diagnosis not present

## 2020-12-10 DIAGNOSIS — R269 Unspecified abnormalities of gait and mobility: Secondary | ICD-10-CM | POA: Diagnosis not present

## 2020-12-10 DIAGNOSIS — E46 Unspecified protein-calorie malnutrition: Secondary | ICD-10-CM | POA: Diagnosis not present

## 2020-12-10 DIAGNOSIS — I6930 Unspecified sequelae of cerebral infarction: Secondary | ICD-10-CM | POA: Diagnosis not present

## 2020-12-10 DIAGNOSIS — E785 Hyperlipidemia, unspecified: Secondary | ICD-10-CM | POA: Diagnosis not present

## 2020-12-10 DIAGNOSIS — M6281 Muscle weakness (generalized): Secondary | ICD-10-CM | POA: Diagnosis not present

## 2020-12-11 DIAGNOSIS — Z79899 Other long term (current) drug therapy: Secondary | ICD-10-CM | POA: Diagnosis not present

## 2020-12-11 DIAGNOSIS — E785 Hyperlipidemia, unspecified: Secondary | ICD-10-CM | POA: Diagnosis not present

## 2020-12-12 DIAGNOSIS — J690 Pneumonitis due to inhalation of food and vomit: Secondary | ICD-10-CM | POA: Diagnosis not present

## 2020-12-12 DIAGNOSIS — R1311 Dysphagia, oral phase: Secondary | ICD-10-CM | POA: Diagnosis not present

## 2020-12-12 DIAGNOSIS — E039 Hypothyroidism, unspecified: Secondary | ICD-10-CM | POA: Diagnosis not present

## 2020-12-12 DIAGNOSIS — E46 Unspecified protein-calorie malnutrition: Secondary | ICD-10-CM | POA: Diagnosis not present

## 2020-12-12 DIAGNOSIS — I10 Essential (primary) hypertension: Secondary | ICD-10-CM | POA: Diagnosis not present

## 2020-12-12 DIAGNOSIS — M6259 Muscle wasting and atrophy, not elsewhere classified, multiple sites: Secondary | ICD-10-CM | POA: Diagnosis not present

## 2020-12-12 DIAGNOSIS — M6281 Muscle weakness (generalized): Secondary | ICD-10-CM | POA: Diagnosis not present

## 2020-12-12 DIAGNOSIS — I6930 Unspecified sequelae of cerebral infarction: Secondary | ICD-10-CM | POA: Diagnosis not present

## 2020-12-12 DIAGNOSIS — Z48812 Encounter for surgical aftercare following surgery on the circulatory system: Secondary | ICD-10-CM | POA: Diagnosis not present

## 2020-12-12 DIAGNOSIS — E785 Hyperlipidemia, unspecified: Secondary | ICD-10-CM | POA: Diagnosis not present

## 2020-12-12 DIAGNOSIS — J9601 Acute respiratory failure with hypoxia: Secondary | ICD-10-CM | POA: Diagnosis not present

## 2020-12-12 DIAGNOSIS — R269 Unspecified abnormalities of gait and mobility: Secondary | ICD-10-CM | POA: Diagnosis not present

## 2020-12-13 DIAGNOSIS — E785 Hyperlipidemia, unspecified: Secondary | ICD-10-CM | POA: Diagnosis not present

## 2020-12-13 DIAGNOSIS — M6259 Muscle wasting and atrophy, not elsewhere classified, multiple sites: Secondary | ICD-10-CM | POA: Diagnosis not present

## 2020-12-13 DIAGNOSIS — M6281 Muscle weakness (generalized): Secondary | ICD-10-CM | POA: Diagnosis not present

## 2020-12-13 DIAGNOSIS — E039 Hypothyroidism, unspecified: Secondary | ICD-10-CM | POA: Diagnosis not present

## 2020-12-13 DIAGNOSIS — R269 Unspecified abnormalities of gait and mobility: Secondary | ICD-10-CM | POA: Diagnosis not present

## 2020-12-13 DIAGNOSIS — E46 Unspecified protein-calorie malnutrition: Secondary | ICD-10-CM | POA: Diagnosis not present

## 2020-12-13 DIAGNOSIS — I10 Essential (primary) hypertension: Secondary | ICD-10-CM | POA: Diagnosis not present

## 2020-12-13 DIAGNOSIS — J690 Pneumonitis due to inhalation of food and vomit: Secondary | ICD-10-CM | POA: Diagnosis not present

## 2020-12-13 DIAGNOSIS — J9601 Acute respiratory failure with hypoxia: Secondary | ICD-10-CM | POA: Diagnosis not present

## 2020-12-13 DIAGNOSIS — I6930 Unspecified sequelae of cerebral infarction: Secondary | ICD-10-CM | POA: Diagnosis not present

## 2020-12-13 DIAGNOSIS — Z48812 Encounter for surgical aftercare following surgery on the circulatory system: Secondary | ICD-10-CM | POA: Diagnosis not present

## 2020-12-13 DIAGNOSIS — R1311 Dysphagia, oral phase: Secondary | ICD-10-CM | POA: Diagnosis not present

## 2020-12-16 DIAGNOSIS — J9601 Acute respiratory failure with hypoxia: Secondary | ICD-10-CM | POA: Diagnosis not present

## 2020-12-16 DIAGNOSIS — I10 Essential (primary) hypertension: Secondary | ICD-10-CM | POA: Diagnosis not present

## 2020-12-16 DIAGNOSIS — R269 Unspecified abnormalities of gait and mobility: Secondary | ICD-10-CM | POA: Diagnosis not present

## 2020-12-16 DIAGNOSIS — Z48812 Encounter for surgical aftercare following surgery on the circulatory system: Secondary | ICD-10-CM | POA: Diagnosis not present

## 2020-12-16 DIAGNOSIS — J690 Pneumonitis due to inhalation of food and vomit: Secondary | ICD-10-CM | POA: Diagnosis not present

## 2020-12-16 DIAGNOSIS — M6259 Muscle wasting and atrophy, not elsewhere classified, multiple sites: Secondary | ICD-10-CM | POA: Diagnosis not present

## 2020-12-16 DIAGNOSIS — E039 Hypothyroidism, unspecified: Secondary | ICD-10-CM | POA: Diagnosis not present

## 2020-12-16 DIAGNOSIS — I6930 Unspecified sequelae of cerebral infarction: Secondary | ICD-10-CM | POA: Diagnosis not present

## 2020-12-16 DIAGNOSIS — E46 Unspecified protein-calorie malnutrition: Secondary | ICD-10-CM | POA: Diagnosis not present

## 2020-12-16 DIAGNOSIS — E785 Hyperlipidemia, unspecified: Secondary | ICD-10-CM | POA: Diagnosis not present

## 2020-12-16 DIAGNOSIS — R1311 Dysphagia, oral phase: Secondary | ICD-10-CM | POA: Diagnosis not present

## 2020-12-16 DIAGNOSIS — M6281 Muscle weakness (generalized): Secondary | ICD-10-CM | POA: Diagnosis not present

## 2020-12-17 DIAGNOSIS — J9601 Acute respiratory failure with hypoxia: Secondary | ICD-10-CM | POA: Diagnosis not present

## 2020-12-17 DIAGNOSIS — R1311 Dysphagia, oral phase: Secondary | ICD-10-CM | POA: Diagnosis not present

## 2020-12-17 DIAGNOSIS — E46 Unspecified protein-calorie malnutrition: Secondary | ICD-10-CM | POA: Diagnosis not present

## 2020-12-17 DIAGNOSIS — I10 Essential (primary) hypertension: Secondary | ICD-10-CM | POA: Diagnosis not present

## 2020-12-17 DIAGNOSIS — E039 Hypothyroidism, unspecified: Secondary | ICD-10-CM | POA: Diagnosis not present

## 2020-12-17 DIAGNOSIS — J690 Pneumonitis due to inhalation of food and vomit: Secondary | ICD-10-CM | POA: Diagnosis not present

## 2020-12-17 DIAGNOSIS — Z48812 Encounter for surgical aftercare following surgery on the circulatory system: Secondary | ICD-10-CM | POA: Diagnosis not present

## 2020-12-17 DIAGNOSIS — E785 Hyperlipidemia, unspecified: Secondary | ICD-10-CM | POA: Diagnosis not present

## 2020-12-17 DIAGNOSIS — R269 Unspecified abnormalities of gait and mobility: Secondary | ICD-10-CM | POA: Diagnosis not present

## 2020-12-17 DIAGNOSIS — M6259 Muscle wasting and atrophy, not elsewhere classified, multiple sites: Secondary | ICD-10-CM | POA: Diagnosis not present

## 2020-12-17 DIAGNOSIS — M6281 Muscle weakness (generalized): Secondary | ICD-10-CM | POA: Diagnosis not present

## 2020-12-17 DIAGNOSIS — I6930 Unspecified sequelae of cerebral infarction: Secondary | ICD-10-CM | POA: Diagnosis not present

## 2020-12-18 DIAGNOSIS — J9601 Acute respiratory failure with hypoxia: Secondary | ICD-10-CM | POA: Diagnosis not present

## 2020-12-18 DIAGNOSIS — I6930 Unspecified sequelae of cerebral infarction: Secondary | ICD-10-CM | POA: Diagnosis not present

## 2020-12-18 DIAGNOSIS — R1311 Dysphagia, oral phase: Secondary | ICD-10-CM | POA: Diagnosis not present

## 2020-12-18 DIAGNOSIS — E46 Unspecified protein-calorie malnutrition: Secondary | ICD-10-CM | POA: Diagnosis not present

## 2020-12-18 DIAGNOSIS — M6281 Muscle weakness (generalized): Secondary | ICD-10-CM | POA: Diagnosis not present

## 2020-12-18 DIAGNOSIS — J690 Pneumonitis due to inhalation of food and vomit: Secondary | ICD-10-CM | POA: Diagnosis not present

## 2020-12-18 DIAGNOSIS — I10 Essential (primary) hypertension: Secondary | ICD-10-CM | POA: Diagnosis not present

## 2020-12-18 DIAGNOSIS — Z48812 Encounter for surgical aftercare following surgery on the circulatory system: Secondary | ICD-10-CM | POA: Diagnosis not present

## 2020-12-18 DIAGNOSIS — R269 Unspecified abnormalities of gait and mobility: Secondary | ICD-10-CM | POA: Diagnosis not present

## 2020-12-18 DIAGNOSIS — M6259 Muscle wasting and atrophy, not elsewhere classified, multiple sites: Secondary | ICD-10-CM | POA: Diagnosis not present

## 2020-12-18 DIAGNOSIS — E039 Hypothyroidism, unspecified: Secondary | ICD-10-CM | POA: Diagnosis not present

## 2020-12-18 DIAGNOSIS — E785 Hyperlipidemia, unspecified: Secondary | ICD-10-CM | POA: Diagnosis not present

## 2020-12-19 DIAGNOSIS — M6259 Muscle wasting and atrophy, not elsewhere classified, multiple sites: Secondary | ICD-10-CM | POA: Diagnosis not present

## 2020-12-19 DIAGNOSIS — R1311 Dysphagia, oral phase: Secondary | ICD-10-CM | POA: Diagnosis not present

## 2020-12-19 DIAGNOSIS — I6930 Unspecified sequelae of cerebral infarction: Secondary | ICD-10-CM | POA: Diagnosis not present

## 2020-12-19 DIAGNOSIS — Z48812 Encounter for surgical aftercare following surgery on the circulatory system: Secondary | ICD-10-CM | POA: Diagnosis not present

## 2020-12-19 DIAGNOSIS — E039 Hypothyroidism, unspecified: Secondary | ICD-10-CM | POA: Diagnosis not present

## 2020-12-19 DIAGNOSIS — I10 Essential (primary) hypertension: Secondary | ICD-10-CM | POA: Diagnosis not present

## 2020-12-19 DIAGNOSIS — R269 Unspecified abnormalities of gait and mobility: Secondary | ICD-10-CM | POA: Diagnosis not present

## 2020-12-19 DIAGNOSIS — E785 Hyperlipidemia, unspecified: Secondary | ICD-10-CM | POA: Diagnosis not present

## 2020-12-19 DIAGNOSIS — E46 Unspecified protein-calorie malnutrition: Secondary | ICD-10-CM | POA: Diagnosis not present

## 2020-12-19 DIAGNOSIS — J690 Pneumonitis due to inhalation of food and vomit: Secondary | ICD-10-CM | POA: Diagnosis not present

## 2020-12-19 DIAGNOSIS — J9601 Acute respiratory failure with hypoxia: Secondary | ICD-10-CM | POA: Diagnosis not present

## 2020-12-19 DIAGNOSIS — M6281 Muscle weakness (generalized): Secondary | ICD-10-CM | POA: Diagnosis not present

## 2020-12-20 DIAGNOSIS — Z48812 Encounter for surgical aftercare following surgery on the circulatory system: Secondary | ICD-10-CM | POA: Diagnosis not present

## 2020-12-20 DIAGNOSIS — R1311 Dysphagia, oral phase: Secondary | ICD-10-CM | POA: Diagnosis not present

## 2020-12-20 DIAGNOSIS — I6930 Unspecified sequelae of cerebral infarction: Secondary | ICD-10-CM | POA: Diagnosis not present

## 2020-12-20 DIAGNOSIS — M6259 Muscle wasting and atrophy, not elsewhere classified, multiple sites: Secondary | ICD-10-CM | POA: Diagnosis not present

## 2020-12-20 DIAGNOSIS — R269 Unspecified abnormalities of gait and mobility: Secondary | ICD-10-CM | POA: Diagnosis not present

## 2020-12-20 DIAGNOSIS — E46 Unspecified protein-calorie malnutrition: Secondary | ICD-10-CM | POA: Diagnosis not present

## 2020-12-20 DIAGNOSIS — J9601 Acute respiratory failure with hypoxia: Secondary | ICD-10-CM | POA: Diagnosis not present

## 2020-12-20 DIAGNOSIS — M6281 Muscle weakness (generalized): Secondary | ICD-10-CM | POA: Diagnosis not present

## 2020-12-20 DIAGNOSIS — E039 Hypothyroidism, unspecified: Secondary | ICD-10-CM | POA: Diagnosis not present

## 2020-12-20 DIAGNOSIS — E785 Hyperlipidemia, unspecified: Secondary | ICD-10-CM | POA: Diagnosis not present

## 2020-12-20 DIAGNOSIS — I10 Essential (primary) hypertension: Secondary | ICD-10-CM | POA: Diagnosis not present

## 2020-12-20 DIAGNOSIS — J690 Pneumonitis due to inhalation of food and vomit: Secondary | ICD-10-CM | POA: Diagnosis not present

## 2020-12-23 DIAGNOSIS — Z48812 Encounter for surgical aftercare following surgery on the circulatory system: Secondary | ICD-10-CM | POA: Diagnosis not present

## 2020-12-23 DIAGNOSIS — R1311 Dysphagia, oral phase: Secondary | ICD-10-CM | POA: Diagnosis not present

## 2020-12-23 DIAGNOSIS — E785 Hyperlipidemia, unspecified: Secondary | ICD-10-CM | POA: Diagnosis not present

## 2020-12-23 DIAGNOSIS — R269 Unspecified abnormalities of gait and mobility: Secondary | ICD-10-CM | POA: Diagnosis not present

## 2020-12-23 DIAGNOSIS — I10 Essential (primary) hypertension: Secondary | ICD-10-CM | POA: Diagnosis not present

## 2020-12-23 DIAGNOSIS — E039 Hypothyroidism, unspecified: Secondary | ICD-10-CM | POA: Diagnosis not present

## 2020-12-23 DIAGNOSIS — J690 Pneumonitis due to inhalation of food and vomit: Secondary | ICD-10-CM | POA: Diagnosis not present

## 2020-12-23 DIAGNOSIS — M6281 Muscle weakness (generalized): Secondary | ICD-10-CM | POA: Diagnosis not present

## 2020-12-23 DIAGNOSIS — J9601 Acute respiratory failure with hypoxia: Secondary | ICD-10-CM | POA: Diagnosis not present

## 2020-12-23 DIAGNOSIS — I6930 Unspecified sequelae of cerebral infarction: Secondary | ICD-10-CM | POA: Diagnosis not present

## 2020-12-23 DIAGNOSIS — M6259 Muscle wasting and atrophy, not elsewhere classified, multiple sites: Secondary | ICD-10-CM | POA: Diagnosis not present

## 2020-12-23 DIAGNOSIS — E46 Unspecified protein-calorie malnutrition: Secondary | ICD-10-CM | POA: Diagnosis not present

## 2020-12-24 DIAGNOSIS — R1311 Dysphagia, oral phase: Secondary | ICD-10-CM | POA: Diagnosis not present

## 2020-12-24 DIAGNOSIS — E039 Hypothyroidism, unspecified: Secondary | ICD-10-CM | POA: Diagnosis not present

## 2020-12-24 DIAGNOSIS — Z48812 Encounter for surgical aftercare following surgery on the circulatory system: Secondary | ICD-10-CM | POA: Diagnosis not present

## 2020-12-24 DIAGNOSIS — R269 Unspecified abnormalities of gait and mobility: Secondary | ICD-10-CM | POA: Diagnosis not present

## 2020-12-24 DIAGNOSIS — M6259 Muscle wasting and atrophy, not elsewhere classified, multiple sites: Secondary | ICD-10-CM | POA: Diagnosis not present

## 2020-12-24 DIAGNOSIS — I10 Essential (primary) hypertension: Secondary | ICD-10-CM | POA: Diagnosis not present

## 2020-12-24 DIAGNOSIS — I6930 Unspecified sequelae of cerebral infarction: Secondary | ICD-10-CM | POA: Diagnosis not present

## 2020-12-24 DIAGNOSIS — E46 Unspecified protein-calorie malnutrition: Secondary | ICD-10-CM | POA: Diagnosis not present

## 2020-12-24 DIAGNOSIS — E785 Hyperlipidemia, unspecified: Secondary | ICD-10-CM | POA: Diagnosis not present

## 2020-12-24 DIAGNOSIS — J9601 Acute respiratory failure with hypoxia: Secondary | ICD-10-CM | POA: Diagnosis not present

## 2020-12-24 DIAGNOSIS — M6281 Muscle weakness (generalized): Secondary | ICD-10-CM | POA: Diagnosis not present

## 2020-12-24 DIAGNOSIS — J690 Pneumonitis due to inhalation of food and vomit: Secondary | ICD-10-CM | POA: Diagnosis not present

## 2020-12-25 DIAGNOSIS — M6259 Muscle wasting and atrophy, not elsewhere classified, multiple sites: Secondary | ICD-10-CM | POA: Diagnosis not present

## 2020-12-25 DIAGNOSIS — R269 Unspecified abnormalities of gait and mobility: Secondary | ICD-10-CM | POA: Diagnosis not present

## 2020-12-25 DIAGNOSIS — J690 Pneumonitis due to inhalation of food and vomit: Secondary | ICD-10-CM | POA: Diagnosis not present

## 2020-12-25 DIAGNOSIS — J9601 Acute respiratory failure with hypoxia: Secondary | ICD-10-CM | POA: Diagnosis not present

## 2020-12-25 DIAGNOSIS — R1311 Dysphagia, oral phase: Secondary | ICD-10-CM | POA: Diagnosis not present

## 2020-12-25 DIAGNOSIS — I10 Essential (primary) hypertension: Secondary | ICD-10-CM | POA: Diagnosis not present

## 2020-12-25 DIAGNOSIS — E46 Unspecified protein-calorie malnutrition: Secondary | ICD-10-CM | POA: Diagnosis not present

## 2020-12-25 DIAGNOSIS — I6930 Unspecified sequelae of cerebral infarction: Secondary | ICD-10-CM | POA: Diagnosis not present

## 2020-12-25 DIAGNOSIS — E039 Hypothyroidism, unspecified: Secondary | ICD-10-CM | POA: Diagnosis not present

## 2020-12-25 DIAGNOSIS — M6281 Muscle weakness (generalized): Secondary | ICD-10-CM | POA: Diagnosis not present

## 2020-12-25 DIAGNOSIS — E785 Hyperlipidemia, unspecified: Secondary | ICD-10-CM | POA: Diagnosis not present

## 2020-12-25 DIAGNOSIS — Z48812 Encounter for surgical aftercare following surgery on the circulatory system: Secondary | ICD-10-CM | POA: Diagnosis not present

## 2020-12-26 DIAGNOSIS — R269 Unspecified abnormalities of gait and mobility: Secondary | ICD-10-CM | POA: Diagnosis not present

## 2020-12-26 DIAGNOSIS — M6259 Muscle wasting and atrophy, not elsewhere classified, multiple sites: Secondary | ICD-10-CM | POA: Diagnosis not present

## 2020-12-26 DIAGNOSIS — E46 Unspecified protein-calorie malnutrition: Secondary | ICD-10-CM | POA: Diagnosis not present

## 2020-12-26 DIAGNOSIS — Z48812 Encounter for surgical aftercare following surgery on the circulatory system: Secondary | ICD-10-CM | POA: Diagnosis not present

## 2020-12-26 DIAGNOSIS — I6930 Unspecified sequelae of cerebral infarction: Secondary | ICD-10-CM | POA: Diagnosis not present

## 2020-12-26 DIAGNOSIS — J9601 Acute respiratory failure with hypoxia: Secondary | ICD-10-CM | POA: Diagnosis not present

## 2020-12-26 DIAGNOSIS — J690 Pneumonitis due to inhalation of food and vomit: Secondary | ICD-10-CM | POA: Diagnosis not present

## 2020-12-26 DIAGNOSIS — E039 Hypothyroidism, unspecified: Secondary | ICD-10-CM | POA: Diagnosis not present

## 2020-12-26 DIAGNOSIS — R1311 Dysphagia, oral phase: Secondary | ICD-10-CM | POA: Diagnosis not present

## 2020-12-26 DIAGNOSIS — E785 Hyperlipidemia, unspecified: Secondary | ICD-10-CM | POA: Diagnosis not present

## 2020-12-26 DIAGNOSIS — I10 Essential (primary) hypertension: Secondary | ICD-10-CM | POA: Diagnosis not present

## 2020-12-26 DIAGNOSIS — M6281 Muscle weakness (generalized): Secondary | ICD-10-CM | POA: Diagnosis not present

## 2020-12-27 DIAGNOSIS — J9601 Acute respiratory failure with hypoxia: Secondary | ICD-10-CM | POA: Diagnosis not present

## 2020-12-27 DIAGNOSIS — I6930 Unspecified sequelae of cerebral infarction: Secondary | ICD-10-CM | POA: Diagnosis not present

## 2020-12-27 DIAGNOSIS — I10 Essential (primary) hypertension: Secondary | ICD-10-CM | POA: Diagnosis not present

## 2020-12-27 DIAGNOSIS — J690 Pneumonitis due to inhalation of food and vomit: Secondary | ICD-10-CM | POA: Diagnosis not present

## 2020-12-27 DIAGNOSIS — M6259 Muscle wasting and atrophy, not elsewhere classified, multiple sites: Secondary | ICD-10-CM | POA: Diagnosis not present

## 2020-12-27 DIAGNOSIS — R269 Unspecified abnormalities of gait and mobility: Secondary | ICD-10-CM | POA: Diagnosis not present

## 2020-12-27 DIAGNOSIS — E039 Hypothyroidism, unspecified: Secondary | ICD-10-CM | POA: Diagnosis not present

## 2020-12-27 DIAGNOSIS — Z48812 Encounter for surgical aftercare following surgery on the circulatory system: Secondary | ICD-10-CM | POA: Diagnosis not present

## 2020-12-27 DIAGNOSIS — E46 Unspecified protein-calorie malnutrition: Secondary | ICD-10-CM | POA: Diagnosis not present

## 2020-12-27 DIAGNOSIS — R1311 Dysphagia, oral phase: Secondary | ICD-10-CM | POA: Diagnosis not present

## 2020-12-27 DIAGNOSIS — M6281 Muscle weakness (generalized): Secondary | ICD-10-CM | POA: Diagnosis not present

## 2020-12-27 DIAGNOSIS — E785 Hyperlipidemia, unspecified: Secondary | ICD-10-CM | POA: Diagnosis not present

## 2020-12-30 DIAGNOSIS — J9601 Acute respiratory failure with hypoxia: Secondary | ICD-10-CM | POA: Diagnosis not present

## 2020-12-30 DIAGNOSIS — I6930 Unspecified sequelae of cerebral infarction: Secondary | ICD-10-CM | POA: Diagnosis not present

## 2020-12-30 DIAGNOSIS — E785 Hyperlipidemia, unspecified: Secondary | ICD-10-CM | POA: Diagnosis not present

## 2020-12-30 DIAGNOSIS — I10 Essential (primary) hypertension: Secondary | ICD-10-CM | POA: Diagnosis not present

## 2020-12-30 DIAGNOSIS — M6281 Muscle weakness (generalized): Secondary | ICD-10-CM | POA: Diagnosis not present

## 2020-12-30 DIAGNOSIS — M6259 Muscle wasting and atrophy, not elsewhere classified, multiple sites: Secondary | ICD-10-CM | POA: Diagnosis not present

## 2020-12-30 DIAGNOSIS — R269 Unspecified abnormalities of gait and mobility: Secondary | ICD-10-CM | POA: Diagnosis not present

## 2020-12-30 DIAGNOSIS — E46 Unspecified protein-calorie malnutrition: Secondary | ICD-10-CM | POA: Diagnosis not present

## 2020-12-30 DIAGNOSIS — Z950 Presence of cardiac pacemaker: Secondary | ICD-10-CM | POA: Diagnosis not present

## 2020-12-30 DIAGNOSIS — E039 Hypothyroidism, unspecified: Secondary | ICD-10-CM | POA: Diagnosis not present

## 2020-12-30 DIAGNOSIS — Z48812 Encounter for surgical aftercare following surgery on the circulatory system: Secondary | ICD-10-CM | POA: Diagnosis not present

## 2020-12-30 DIAGNOSIS — R1311 Dysphagia, oral phase: Secondary | ICD-10-CM | POA: Diagnosis not present

## 2020-12-30 DIAGNOSIS — J690 Pneumonitis due to inhalation of food and vomit: Secondary | ICD-10-CM | POA: Diagnosis not present

## 2020-12-31 DIAGNOSIS — M6259 Muscle wasting and atrophy, not elsewhere classified, multiple sites: Secondary | ICD-10-CM | POA: Diagnosis not present

## 2020-12-31 DIAGNOSIS — J690 Pneumonitis due to inhalation of food and vomit: Secondary | ICD-10-CM | POA: Diagnosis not present

## 2020-12-31 DIAGNOSIS — I6930 Unspecified sequelae of cerebral infarction: Secondary | ICD-10-CM | POA: Diagnosis not present

## 2020-12-31 DIAGNOSIS — I10 Essential (primary) hypertension: Secondary | ICD-10-CM | POA: Diagnosis not present

## 2020-12-31 DIAGNOSIS — M6281 Muscle weakness (generalized): Secondary | ICD-10-CM | POA: Diagnosis not present

## 2020-12-31 DIAGNOSIS — Z48812 Encounter for surgical aftercare following surgery on the circulatory system: Secondary | ICD-10-CM | POA: Diagnosis not present

## 2020-12-31 DIAGNOSIS — J9601 Acute respiratory failure with hypoxia: Secondary | ICD-10-CM | POA: Diagnosis not present

## 2020-12-31 DIAGNOSIS — E785 Hyperlipidemia, unspecified: Secondary | ICD-10-CM | POA: Diagnosis not present

## 2020-12-31 DIAGNOSIS — R1311 Dysphagia, oral phase: Secondary | ICD-10-CM | POA: Diagnosis not present

## 2020-12-31 DIAGNOSIS — E46 Unspecified protein-calorie malnutrition: Secondary | ICD-10-CM | POA: Diagnosis not present

## 2020-12-31 DIAGNOSIS — R269 Unspecified abnormalities of gait and mobility: Secondary | ICD-10-CM | POA: Diagnosis not present

## 2020-12-31 DIAGNOSIS — E039 Hypothyroidism, unspecified: Secondary | ICD-10-CM | POA: Diagnosis not present

## 2021-01-01 DIAGNOSIS — I10 Essential (primary) hypertension: Secondary | ICD-10-CM | POA: Diagnosis not present

## 2021-01-01 DIAGNOSIS — R269 Unspecified abnormalities of gait and mobility: Secondary | ICD-10-CM | POA: Diagnosis not present

## 2021-01-01 DIAGNOSIS — E46 Unspecified protein-calorie malnutrition: Secondary | ICD-10-CM | POA: Diagnosis not present

## 2021-01-01 DIAGNOSIS — J9601 Acute respiratory failure with hypoxia: Secondary | ICD-10-CM | POA: Diagnosis not present

## 2021-01-01 DIAGNOSIS — M6259 Muscle wasting and atrophy, not elsewhere classified, multiple sites: Secondary | ICD-10-CM | POA: Diagnosis not present

## 2021-01-01 DIAGNOSIS — R1311 Dysphagia, oral phase: Secondary | ICD-10-CM | POA: Diagnosis not present

## 2021-01-01 DIAGNOSIS — J690 Pneumonitis due to inhalation of food and vomit: Secondary | ICD-10-CM | POA: Diagnosis not present

## 2021-01-01 DIAGNOSIS — I6930 Unspecified sequelae of cerebral infarction: Secondary | ICD-10-CM | POA: Diagnosis not present

## 2021-01-01 DIAGNOSIS — E785 Hyperlipidemia, unspecified: Secondary | ICD-10-CM | POA: Diagnosis not present

## 2021-01-01 DIAGNOSIS — M6281 Muscle weakness (generalized): Secondary | ICD-10-CM | POA: Diagnosis not present

## 2021-01-01 DIAGNOSIS — E039 Hypothyroidism, unspecified: Secondary | ICD-10-CM | POA: Diagnosis not present

## 2021-01-01 DIAGNOSIS — Z48812 Encounter for surgical aftercare following surgery on the circulatory system: Secondary | ICD-10-CM | POA: Diagnosis not present

## 2021-01-02 DIAGNOSIS — M6259 Muscle wasting and atrophy, not elsewhere classified, multiple sites: Secondary | ICD-10-CM | POA: Diagnosis not present

## 2021-01-02 DIAGNOSIS — E46 Unspecified protein-calorie malnutrition: Secondary | ICD-10-CM | POA: Diagnosis not present

## 2021-01-02 DIAGNOSIS — Z48812 Encounter for surgical aftercare following surgery on the circulatory system: Secondary | ICD-10-CM | POA: Diagnosis not present

## 2021-01-02 DIAGNOSIS — R1311 Dysphagia, oral phase: Secondary | ICD-10-CM | POA: Diagnosis not present

## 2021-01-02 DIAGNOSIS — E785 Hyperlipidemia, unspecified: Secondary | ICD-10-CM | POA: Diagnosis not present

## 2021-01-02 DIAGNOSIS — I10 Essential (primary) hypertension: Secondary | ICD-10-CM | POA: Diagnosis not present

## 2021-01-02 DIAGNOSIS — J9601 Acute respiratory failure with hypoxia: Secondary | ICD-10-CM | POA: Diagnosis not present

## 2021-01-02 DIAGNOSIS — J690 Pneumonitis due to inhalation of food and vomit: Secondary | ICD-10-CM | POA: Diagnosis not present

## 2021-01-02 DIAGNOSIS — M6281 Muscle weakness (generalized): Secondary | ICD-10-CM | POA: Diagnosis not present

## 2021-01-02 DIAGNOSIS — R269 Unspecified abnormalities of gait and mobility: Secondary | ICD-10-CM | POA: Diagnosis not present

## 2021-01-02 DIAGNOSIS — E039 Hypothyroidism, unspecified: Secondary | ICD-10-CM | POA: Diagnosis not present

## 2021-01-02 DIAGNOSIS — I6930 Unspecified sequelae of cerebral infarction: Secondary | ICD-10-CM | POA: Diagnosis not present

## 2021-01-03 DIAGNOSIS — M6259 Muscle wasting and atrophy, not elsewhere classified, multiple sites: Secondary | ICD-10-CM | POA: Diagnosis not present

## 2021-01-03 DIAGNOSIS — I6930 Unspecified sequelae of cerebral infarction: Secondary | ICD-10-CM | POA: Diagnosis not present

## 2021-01-03 DIAGNOSIS — J9601 Acute respiratory failure with hypoxia: Secondary | ICD-10-CM | POA: Diagnosis not present

## 2021-01-03 DIAGNOSIS — E039 Hypothyroidism, unspecified: Secondary | ICD-10-CM | POA: Diagnosis not present

## 2021-01-03 DIAGNOSIS — R1311 Dysphagia, oral phase: Secondary | ICD-10-CM | POA: Diagnosis not present

## 2021-01-03 DIAGNOSIS — I10 Essential (primary) hypertension: Secondary | ICD-10-CM | POA: Diagnosis not present

## 2021-01-03 DIAGNOSIS — M6281 Muscle weakness (generalized): Secondary | ICD-10-CM | POA: Diagnosis not present

## 2021-01-03 DIAGNOSIS — R269 Unspecified abnormalities of gait and mobility: Secondary | ICD-10-CM | POA: Diagnosis not present

## 2021-01-03 DIAGNOSIS — J690 Pneumonitis due to inhalation of food and vomit: Secondary | ICD-10-CM | POA: Diagnosis not present

## 2021-01-03 DIAGNOSIS — E46 Unspecified protein-calorie malnutrition: Secondary | ICD-10-CM | POA: Diagnosis not present

## 2021-01-03 DIAGNOSIS — Z48812 Encounter for surgical aftercare following surgery on the circulatory system: Secondary | ICD-10-CM | POA: Diagnosis not present

## 2021-01-03 DIAGNOSIS — E785 Hyperlipidemia, unspecified: Secondary | ICD-10-CM | POA: Diagnosis not present

## 2021-01-06 DIAGNOSIS — E46 Unspecified protein-calorie malnutrition: Secondary | ICD-10-CM | POA: Diagnosis not present

## 2021-01-06 DIAGNOSIS — R1311 Dysphagia, oral phase: Secondary | ICD-10-CM | POA: Diagnosis not present

## 2021-01-06 DIAGNOSIS — Z48812 Encounter for surgical aftercare following surgery on the circulatory system: Secondary | ICD-10-CM | POA: Diagnosis not present

## 2021-01-06 DIAGNOSIS — I6930 Unspecified sequelae of cerebral infarction: Secondary | ICD-10-CM | POA: Diagnosis not present

## 2021-01-06 DIAGNOSIS — E785 Hyperlipidemia, unspecified: Secondary | ICD-10-CM | POA: Diagnosis not present

## 2021-01-06 DIAGNOSIS — M6259 Muscle wasting and atrophy, not elsewhere classified, multiple sites: Secondary | ICD-10-CM | POA: Diagnosis not present

## 2021-01-06 DIAGNOSIS — J9601 Acute respiratory failure with hypoxia: Secondary | ICD-10-CM | POA: Diagnosis not present

## 2021-01-06 DIAGNOSIS — M6281 Muscle weakness (generalized): Secondary | ICD-10-CM | POA: Diagnosis not present

## 2021-01-06 DIAGNOSIS — J690 Pneumonitis due to inhalation of food and vomit: Secondary | ICD-10-CM | POA: Diagnosis not present

## 2021-01-06 DIAGNOSIS — R269 Unspecified abnormalities of gait and mobility: Secondary | ICD-10-CM | POA: Diagnosis not present

## 2021-01-06 DIAGNOSIS — I10 Essential (primary) hypertension: Secondary | ICD-10-CM | POA: Diagnosis not present

## 2021-01-06 DIAGNOSIS — E039 Hypothyroidism, unspecified: Secondary | ICD-10-CM | POA: Diagnosis not present

## 2021-01-07 DIAGNOSIS — I10 Essential (primary) hypertension: Secondary | ICD-10-CM | POA: Diagnosis not present

## 2021-01-07 DIAGNOSIS — R269 Unspecified abnormalities of gait and mobility: Secondary | ICD-10-CM | POA: Diagnosis not present

## 2021-01-07 DIAGNOSIS — M6259 Muscle wasting and atrophy, not elsewhere classified, multiple sites: Secondary | ICD-10-CM | POA: Diagnosis not present

## 2021-01-07 DIAGNOSIS — R1311 Dysphagia, oral phase: Secondary | ICD-10-CM | POA: Diagnosis not present

## 2021-01-07 DIAGNOSIS — E039 Hypothyroidism, unspecified: Secondary | ICD-10-CM | POA: Diagnosis not present

## 2021-01-07 DIAGNOSIS — Z48812 Encounter for surgical aftercare following surgery on the circulatory system: Secondary | ICD-10-CM | POA: Diagnosis not present

## 2021-01-07 DIAGNOSIS — I6930 Unspecified sequelae of cerebral infarction: Secondary | ICD-10-CM | POA: Diagnosis not present

## 2021-01-07 DIAGNOSIS — E46 Unspecified protein-calorie malnutrition: Secondary | ICD-10-CM | POA: Diagnosis not present

## 2021-01-07 DIAGNOSIS — J9601 Acute respiratory failure with hypoxia: Secondary | ICD-10-CM | POA: Diagnosis not present

## 2021-01-07 DIAGNOSIS — J690 Pneumonitis due to inhalation of food and vomit: Secondary | ICD-10-CM | POA: Diagnosis not present

## 2021-01-07 DIAGNOSIS — E785 Hyperlipidemia, unspecified: Secondary | ICD-10-CM | POA: Diagnosis not present

## 2021-01-07 DIAGNOSIS — M6281 Muscle weakness (generalized): Secondary | ICD-10-CM | POA: Diagnosis not present

## 2021-01-08 DIAGNOSIS — J690 Pneumonitis due to inhalation of food and vomit: Secondary | ICD-10-CM | POA: Diagnosis not present

## 2021-01-08 DIAGNOSIS — M6259 Muscle wasting and atrophy, not elsewhere classified, multiple sites: Secondary | ICD-10-CM | POA: Diagnosis not present

## 2021-01-08 DIAGNOSIS — J9601 Acute respiratory failure with hypoxia: Secondary | ICD-10-CM | POA: Diagnosis not present

## 2021-01-08 DIAGNOSIS — M6281 Muscle weakness (generalized): Secondary | ICD-10-CM | POA: Diagnosis not present

## 2021-01-08 DIAGNOSIS — E785 Hyperlipidemia, unspecified: Secondary | ICD-10-CM | POA: Diagnosis not present

## 2021-01-08 DIAGNOSIS — I6930 Unspecified sequelae of cerebral infarction: Secondary | ICD-10-CM | POA: Diagnosis not present

## 2021-01-08 DIAGNOSIS — E039 Hypothyroidism, unspecified: Secondary | ICD-10-CM | POA: Diagnosis not present

## 2021-01-08 DIAGNOSIS — R269 Unspecified abnormalities of gait and mobility: Secondary | ICD-10-CM | POA: Diagnosis not present

## 2021-01-08 DIAGNOSIS — I10 Essential (primary) hypertension: Secondary | ICD-10-CM | POA: Diagnosis not present

## 2021-01-08 DIAGNOSIS — E46 Unspecified protein-calorie malnutrition: Secondary | ICD-10-CM | POA: Diagnosis not present

## 2021-01-08 DIAGNOSIS — Z48812 Encounter for surgical aftercare following surgery on the circulatory system: Secondary | ICD-10-CM | POA: Diagnosis not present

## 2021-01-08 DIAGNOSIS — R1311 Dysphagia, oral phase: Secondary | ICD-10-CM | POA: Diagnosis not present

## 2021-01-09 DIAGNOSIS — E46 Unspecified protein-calorie malnutrition: Secondary | ICD-10-CM | POA: Diagnosis not present

## 2021-01-09 DIAGNOSIS — E039 Hypothyroidism, unspecified: Secondary | ICD-10-CM | POA: Diagnosis not present

## 2021-01-09 DIAGNOSIS — I6930 Unspecified sequelae of cerebral infarction: Secondary | ICD-10-CM | POA: Diagnosis not present

## 2021-01-09 DIAGNOSIS — I10 Essential (primary) hypertension: Secondary | ICD-10-CM | POA: Diagnosis not present

## 2021-01-09 DIAGNOSIS — R269 Unspecified abnormalities of gait and mobility: Secondary | ICD-10-CM | POA: Diagnosis not present

## 2021-01-09 DIAGNOSIS — J9601 Acute respiratory failure with hypoxia: Secondary | ICD-10-CM | POA: Diagnosis not present

## 2021-01-09 DIAGNOSIS — M6281 Muscle weakness (generalized): Secondary | ICD-10-CM | POA: Diagnosis not present

## 2021-01-09 DIAGNOSIS — Z48812 Encounter for surgical aftercare following surgery on the circulatory system: Secondary | ICD-10-CM | POA: Diagnosis not present

## 2021-01-09 DIAGNOSIS — R1311 Dysphagia, oral phase: Secondary | ICD-10-CM | POA: Diagnosis not present

## 2021-01-09 DIAGNOSIS — E785 Hyperlipidemia, unspecified: Secondary | ICD-10-CM | POA: Diagnosis not present

## 2021-01-09 DIAGNOSIS — J690 Pneumonitis due to inhalation of food and vomit: Secondary | ICD-10-CM | POA: Diagnosis not present

## 2021-01-09 DIAGNOSIS — M6259 Muscle wasting and atrophy, not elsewhere classified, multiple sites: Secondary | ICD-10-CM | POA: Diagnosis not present

## 2021-01-10 DIAGNOSIS — M6281 Muscle weakness (generalized): Secondary | ICD-10-CM | POA: Diagnosis not present

## 2021-01-10 DIAGNOSIS — E46 Unspecified protein-calorie malnutrition: Secondary | ICD-10-CM | POA: Diagnosis not present

## 2021-01-10 DIAGNOSIS — I10 Essential (primary) hypertension: Secondary | ICD-10-CM | POA: Diagnosis not present

## 2021-01-10 DIAGNOSIS — J690 Pneumonitis due to inhalation of food and vomit: Secondary | ICD-10-CM | POA: Diagnosis not present

## 2021-01-10 DIAGNOSIS — J9601 Acute respiratory failure with hypoxia: Secondary | ICD-10-CM | POA: Diagnosis not present

## 2021-01-10 DIAGNOSIS — R269 Unspecified abnormalities of gait and mobility: Secondary | ICD-10-CM | POA: Diagnosis not present

## 2021-01-10 DIAGNOSIS — M6259 Muscle wasting and atrophy, not elsewhere classified, multiple sites: Secondary | ICD-10-CM | POA: Diagnosis not present

## 2021-01-10 DIAGNOSIS — Z48812 Encounter for surgical aftercare following surgery on the circulatory system: Secondary | ICD-10-CM | POA: Diagnosis not present

## 2021-01-10 DIAGNOSIS — R1311 Dysphagia, oral phase: Secondary | ICD-10-CM | POA: Diagnosis not present

## 2021-01-10 DIAGNOSIS — I6930 Unspecified sequelae of cerebral infarction: Secondary | ICD-10-CM | POA: Diagnosis not present

## 2021-01-10 DIAGNOSIS — E039 Hypothyroidism, unspecified: Secondary | ICD-10-CM | POA: Diagnosis not present

## 2021-01-10 DIAGNOSIS — E785 Hyperlipidemia, unspecified: Secondary | ICD-10-CM | POA: Diagnosis not present

## 2021-01-13 DIAGNOSIS — E785 Hyperlipidemia, unspecified: Secondary | ICD-10-CM | POA: Diagnosis not present

## 2021-01-13 DIAGNOSIS — E039 Hypothyroidism, unspecified: Secondary | ICD-10-CM | POA: Diagnosis not present

## 2021-01-13 DIAGNOSIS — Z48812 Encounter for surgical aftercare following surgery on the circulatory system: Secondary | ICD-10-CM | POA: Diagnosis not present

## 2021-01-13 DIAGNOSIS — M6259 Muscle wasting and atrophy, not elsewhere classified, multiple sites: Secondary | ICD-10-CM | POA: Diagnosis not present

## 2021-01-13 DIAGNOSIS — M6281 Muscle weakness (generalized): Secondary | ICD-10-CM | POA: Diagnosis not present

## 2021-01-13 DIAGNOSIS — I6930 Unspecified sequelae of cerebral infarction: Secondary | ICD-10-CM | POA: Diagnosis not present

## 2021-01-13 DIAGNOSIS — J690 Pneumonitis due to inhalation of food and vomit: Secondary | ICD-10-CM | POA: Diagnosis not present

## 2021-01-13 DIAGNOSIS — I10 Essential (primary) hypertension: Secondary | ICD-10-CM | POA: Diagnosis not present

## 2021-01-13 DIAGNOSIS — J9601 Acute respiratory failure with hypoxia: Secondary | ICD-10-CM | POA: Diagnosis not present

## 2021-01-13 DIAGNOSIS — R269 Unspecified abnormalities of gait and mobility: Secondary | ICD-10-CM | POA: Diagnosis not present

## 2021-01-13 DIAGNOSIS — E46 Unspecified protein-calorie malnutrition: Secondary | ICD-10-CM | POA: Diagnosis not present

## 2021-01-13 DIAGNOSIS — R1311 Dysphagia, oral phase: Secondary | ICD-10-CM | POA: Diagnosis not present

## 2021-01-14 DIAGNOSIS — M6281 Muscle weakness (generalized): Secondary | ICD-10-CM | POA: Diagnosis not present

## 2021-01-14 DIAGNOSIS — J9601 Acute respiratory failure with hypoxia: Secondary | ICD-10-CM | POA: Diagnosis not present

## 2021-01-14 DIAGNOSIS — J690 Pneumonitis due to inhalation of food and vomit: Secondary | ICD-10-CM | POA: Diagnosis not present

## 2021-01-14 DIAGNOSIS — E785 Hyperlipidemia, unspecified: Secondary | ICD-10-CM | POA: Diagnosis not present

## 2021-01-14 DIAGNOSIS — I10 Essential (primary) hypertension: Secondary | ICD-10-CM | POA: Diagnosis not present

## 2021-01-14 DIAGNOSIS — E46 Unspecified protein-calorie malnutrition: Secondary | ICD-10-CM | POA: Diagnosis not present

## 2021-01-14 DIAGNOSIS — M6259 Muscle wasting and atrophy, not elsewhere classified, multiple sites: Secondary | ICD-10-CM | POA: Diagnosis not present

## 2021-01-14 DIAGNOSIS — R1311 Dysphagia, oral phase: Secondary | ICD-10-CM | POA: Diagnosis not present

## 2021-01-14 DIAGNOSIS — Z48812 Encounter for surgical aftercare following surgery on the circulatory system: Secondary | ICD-10-CM | POA: Diagnosis not present

## 2021-01-14 DIAGNOSIS — E039 Hypothyroidism, unspecified: Secondary | ICD-10-CM | POA: Diagnosis not present

## 2021-01-14 DIAGNOSIS — I6930 Unspecified sequelae of cerebral infarction: Secondary | ICD-10-CM | POA: Diagnosis not present

## 2021-01-14 DIAGNOSIS — R269 Unspecified abnormalities of gait and mobility: Secondary | ICD-10-CM | POA: Diagnosis not present

## 2021-01-15 DIAGNOSIS — J690 Pneumonitis due to inhalation of food and vomit: Secondary | ICD-10-CM | POA: Diagnosis not present

## 2021-01-15 DIAGNOSIS — R1311 Dysphagia, oral phase: Secondary | ICD-10-CM | POA: Diagnosis not present

## 2021-01-15 DIAGNOSIS — I6930 Unspecified sequelae of cerebral infarction: Secondary | ICD-10-CM | POA: Diagnosis not present

## 2021-01-15 DIAGNOSIS — E785 Hyperlipidemia, unspecified: Secondary | ICD-10-CM | POA: Diagnosis not present

## 2021-01-15 DIAGNOSIS — M6259 Muscle wasting and atrophy, not elsewhere classified, multiple sites: Secondary | ICD-10-CM | POA: Diagnosis not present

## 2021-01-15 DIAGNOSIS — M6281 Muscle weakness (generalized): Secondary | ICD-10-CM | POA: Diagnosis not present

## 2021-01-15 DIAGNOSIS — J9601 Acute respiratory failure with hypoxia: Secondary | ICD-10-CM | POA: Diagnosis not present

## 2021-01-15 DIAGNOSIS — E039 Hypothyroidism, unspecified: Secondary | ICD-10-CM | POA: Diagnosis not present

## 2021-01-15 DIAGNOSIS — Z48812 Encounter for surgical aftercare following surgery on the circulatory system: Secondary | ICD-10-CM | POA: Diagnosis not present

## 2021-01-15 DIAGNOSIS — R269 Unspecified abnormalities of gait and mobility: Secondary | ICD-10-CM | POA: Diagnosis not present

## 2021-01-15 DIAGNOSIS — I10 Essential (primary) hypertension: Secondary | ICD-10-CM | POA: Diagnosis not present

## 2021-01-15 DIAGNOSIS — E46 Unspecified protein-calorie malnutrition: Secondary | ICD-10-CM | POA: Diagnosis not present

## 2021-01-16 DIAGNOSIS — R1311 Dysphagia, oral phase: Secondary | ICD-10-CM | POA: Diagnosis not present

## 2021-01-16 DIAGNOSIS — I6930 Unspecified sequelae of cerebral infarction: Secondary | ICD-10-CM | POA: Diagnosis not present

## 2021-01-16 DIAGNOSIS — J690 Pneumonitis due to inhalation of food and vomit: Secondary | ICD-10-CM | POA: Diagnosis not present

## 2021-01-16 DIAGNOSIS — E46 Unspecified protein-calorie malnutrition: Secondary | ICD-10-CM | POA: Diagnosis not present

## 2021-01-16 DIAGNOSIS — Z48812 Encounter for surgical aftercare following surgery on the circulatory system: Secondary | ICD-10-CM | POA: Diagnosis not present

## 2021-01-16 DIAGNOSIS — E039 Hypothyroidism, unspecified: Secondary | ICD-10-CM | POA: Diagnosis not present

## 2021-01-16 DIAGNOSIS — E785 Hyperlipidemia, unspecified: Secondary | ICD-10-CM | POA: Diagnosis not present

## 2021-01-16 DIAGNOSIS — I10 Essential (primary) hypertension: Secondary | ICD-10-CM | POA: Diagnosis not present

## 2021-01-16 DIAGNOSIS — M6281 Muscle weakness (generalized): Secondary | ICD-10-CM | POA: Diagnosis not present

## 2021-01-16 DIAGNOSIS — M6259 Muscle wasting and atrophy, not elsewhere classified, multiple sites: Secondary | ICD-10-CM | POA: Diagnosis not present

## 2021-01-16 DIAGNOSIS — J9601 Acute respiratory failure with hypoxia: Secondary | ICD-10-CM | POA: Diagnosis not present

## 2021-01-16 DIAGNOSIS — R269 Unspecified abnormalities of gait and mobility: Secondary | ICD-10-CM | POA: Diagnosis not present

## 2021-01-20 DIAGNOSIS — R1311 Dysphagia, oral phase: Secondary | ICD-10-CM | POA: Diagnosis not present

## 2021-01-20 DIAGNOSIS — E039 Hypothyroidism, unspecified: Secondary | ICD-10-CM | POA: Diagnosis not present

## 2021-01-20 DIAGNOSIS — J9601 Acute respiratory failure with hypoxia: Secondary | ICD-10-CM | POA: Diagnosis not present

## 2021-01-20 DIAGNOSIS — Z48812 Encounter for surgical aftercare following surgery on the circulatory system: Secondary | ICD-10-CM | POA: Diagnosis not present

## 2021-01-20 DIAGNOSIS — M6281 Muscle weakness (generalized): Secondary | ICD-10-CM | POA: Diagnosis not present

## 2021-01-20 DIAGNOSIS — I6930 Unspecified sequelae of cerebral infarction: Secondary | ICD-10-CM | POA: Diagnosis not present

## 2021-01-20 DIAGNOSIS — J690 Pneumonitis due to inhalation of food and vomit: Secondary | ICD-10-CM | POA: Diagnosis not present

## 2021-01-20 DIAGNOSIS — E785 Hyperlipidemia, unspecified: Secondary | ICD-10-CM | POA: Diagnosis not present

## 2021-01-20 DIAGNOSIS — I10 Essential (primary) hypertension: Secondary | ICD-10-CM | POA: Diagnosis not present

## 2021-01-20 DIAGNOSIS — E46 Unspecified protein-calorie malnutrition: Secondary | ICD-10-CM | POA: Diagnosis not present

## 2021-01-20 DIAGNOSIS — R269 Unspecified abnormalities of gait and mobility: Secondary | ICD-10-CM | POA: Diagnosis not present

## 2021-01-20 DIAGNOSIS — M6259 Muscle wasting and atrophy, not elsewhere classified, multiple sites: Secondary | ICD-10-CM | POA: Diagnosis not present

## 2021-01-21 DIAGNOSIS — E46 Unspecified protein-calorie malnutrition: Secondary | ICD-10-CM | POA: Diagnosis not present

## 2021-01-21 DIAGNOSIS — R1311 Dysphagia, oral phase: Secondary | ICD-10-CM | POA: Diagnosis not present

## 2021-01-21 DIAGNOSIS — E039 Hypothyroidism, unspecified: Secondary | ICD-10-CM | POA: Diagnosis not present

## 2021-01-21 DIAGNOSIS — R269 Unspecified abnormalities of gait and mobility: Secondary | ICD-10-CM | POA: Diagnosis not present

## 2021-01-21 DIAGNOSIS — I6930 Unspecified sequelae of cerebral infarction: Secondary | ICD-10-CM | POA: Diagnosis not present

## 2021-01-21 DIAGNOSIS — M6259 Muscle wasting and atrophy, not elsewhere classified, multiple sites: Secondary | ICD-10-CM | POA: Diagnosis not present

## 2021-01-21 DIAGNOSIS — E785 Hyperlipidemia, unspecified: Secondary | ICD-10-CM | POA: Diagnosis not present

## 2021-01-21 DIAGNOSIS — M6281 Muscle weakness (generalized): Secondary | ICD-10-CM | POA: Diagnosis not present

## 2021-01-21 DIAGNOSIS — J9601 Acute respiratory failure with hypoxia: Secondary | ICD-10-CM | POA: Diagnosis not present

## 2021-01-21 DIAGNOSIS — Z48812 Encounter for surgical aftercare following surgery on the circulatory system: Secondary | ICD-10-CM | POA: Diagnosis not present

## 2021-01-21 DIAGNOSIS — I10 Essential (primary) hypertension: Secondary | ICD-10-CM | POA: Diagnosis not present

## 2021-01-21 DIAGNOSIS — J690 Pneumonitis due to inhalation of food and vomit: Secondary | ICD-10-CM | POA: Diagnosis not present

## 2021-01-28 DIAGNOSIS — Z79899 Other long term (current) drug therapy: Secondary | ICD-10-CM | POA: Diagnosis not present

## 2021-02-04 DIAGNOSIS — E039 Hypothyroidism, unspecified: Secondary | ICD-10-CM | POA: Diagnosis not present

## 2021-02-04 DIAGNOSIS — I119 Hypertensive heart disease without heart failure: Secondary | ICD-10-CM | POA: Diagnosis not present

## 2021-02-04 DIAGNOSIS — Z8673 Personal history of transient ischemic attack (TIA), and cerebral infarction without residual deficits: Secondary | ICD-10-CM | POA: Diagnosis not present

## 2021-03-05 DIAGNOSIS — Z79899 Other long term (current) drug therapy: Secondary | ICD-10-CM | POA: Diagnosis not present

## 2021-03-07 DIAGNOSIS — I119 Hypertensive heart disease without heart failure: Secondary | ICD-10-CM | POA: Diagnosis not present

## 2021-03-07 DIAGNOSIS — Z8673 Personal history of transient ischemic attack (TIA), and cerebral infarction without residual deficits: Secondary | ICD-10-CM | POA: Diagnosis not present

## 2021-03-12 DIAGNOSIS — E039 Hypothyroidism, unspecified: Secondary | ICD-10-CM | POA: Diagnosis not present

## 2021-03-18 DIAGNOSIS — I10 Essential (primary) hypertension: Secondary | ICD-10-CM | POA: Diagnosis not present

## 2021-03-18 DIAGNOSIS — E785 Hyperlipidemia, unspecified: Secondary | ICD-10-CM | POA: Diagnosis not present

## 2021-03-18 DIAGNOSIS — E039 Hypothyroidism, unspecified: Secondary | ICD-10-CM | POA: Diagnosis not present

## 2021-03-18 DIAGNOSIS — R269 Unspecified abnormalities of gait and mobility: Secondary | ICD-10-CM | POA: Diagnosis not present

## 2021-03-18 DIAGNOSIS — M6281 Muscle weakness (generalized): Secondary | ICD-10-CM | POA: Diagnosis not present

## 2021-03-18 DIAGNOSIS — J690 Pneumonitis due to inhalation of food and vomit: Secondary | ICD-10-CM | POA: Diagnosis not present

## 2021-03-18 DIAGNOSIS — R1311 Dysphagia, oral phase: Secondary | ICD-10-CM | POA: Diagnosis not present

## 2021-03-18 DIAGNOSIS — M6259 Muscle wasting and atrophy, not elsewhere classified, multiple sites: Secondary | ICD-10-CM | POA: Diagnosis not present

## 2021-03-18 DIAGNOSIS — Z48812 Encounter for surgical aftercare following surgery on the circulatory system: Secondary | ICD-10-CM | POA: Diagnosis not present

## 2021-03-18 DIAGNOSIS — E46 Unspecified protein-calorie malnutrition: Secondary | ICD-10-CM | POA: Diagnosis not present

## 2021-03-18 DIAGNOSIS — J9601 Acute respiratory failure with hypoxia: Secondary | ICD-10-CM | POA: Diagnosis not present

## 2021-03-18 DIAGNOSIS — I6930 Unspecified sequelae of cerebral infarction: Secondary | ICD-10-CM | POA: Diagnosis not present

## 2021-03-21 DIAGNOSIS — R451 Restlessness and agitation: Secondary | ICD-10-CM | POA: Diagnosis not present

## 2021-03-21 DIAGNOSIS — I6930 Unspecified sequelae of cerebral infarction: Secondary | ICD-10-CM | POA: Diagnosis not present

## 2021-03-22 DIAGNOSIS — M6281 Muscle weakness (generalized): Secondary | ICD-10-CM | POA: Diagnosis not present

## 2021-03-22 DIAGNOSIS — E46 Unspecified protein-calorie malnutrition: Secondary | ICD-10-CM | POA: Diagnosis not present

## 2021-03-22 DIAGNOSIS — J690 Pneumonitis due to inhalation of food and vomit: Secondary | ICD-10-CM | POA: Diagnosis not present

## 2021-03-22 DIAGNOSIS — Z48812 Encounter for surgical aftercare following surgery on the circulatory system: Secondary | ICD-10-CM | POA: Diagnosis not present

## 2021-03-22 DIAGNOSIS — R269 Unspecified abnormalities of gait and mobility: Secondary | ICD-10-CM | POA: Diagnosis not present

## 2021-03-22 DIAGNOSIS — I10 Essential (primary) hypertension: Secondary | ICD-10-CM | POA: Diagnosis not present

## 2021-03-22 DIAGNOSIS — M6259 Muscle wasting and atrophy, not elsewhere classified, multiple sites: Secondary | ICD-10-CM | POA: Diagnosis not present

## 2021-03-22 DIAGNOSIS — R1311 Dysphagia, oral phase: Secondary | ICD-10-CM | POA: Diagnosis not present

## 2021-03-22 DIAGNOSIS — J9601 Acute respiratory failure with hypoxia: Secondary | ICD-10-CM | POA: Diagnosis not present

## 2021-03-22 DIAGNOSIS — I6930 Unspecified sequelae of cerebral infarction: Secondary | ICD-10-CM | POA: Diagnosis not present

## 2021-03-22 DIAGNOSIS — E039 Hypothyroidism, unspecified: Secondary | ICD-10-CM | POA: Diagnosis not present

## 2021-03-22 DIAGNOSIS — E785 Hyperlipidemia, unspecified: Secondary | ICD-10-CM | POA: Diagnosis not present

## 2021-03-23 DIAGNOSIS — M6281 Muscle weakness (generalized): Secondary | ICD-10-CM | POA: Diagnosis not present

## 2021-03-23 DIAGNOSIS — E46 Unspecified protein-calorie malnutrition: Secondary | ICD-10-CM | POA: Diagnosis not present

## 2021-03-23 DIAGNOSIS — Z48812 Encounter for surgical aftercare following surgery on the circulatory system: Secondary | ICD-10-CM | POA: Diagnosis not present

## 2021-03-23 DIAGNOSIS — R269 Unspecified abnormalities of gait and mobility: Secondary | ICD-10-CM | POA: Diagnosis not present

## 2021-03-23 DIAGNOSIS — E785 Hyperlipidemia, unspecified: Secondary | ICD-10-CM | POA: Diagnosis not present

## 2021-03-23 DIAGNOSIS — I6930 Unspecified sequelae of cerebral infarction: Secondary | ICD-10-CM | POA: Diagnosis not present

## 2021-03-23 DIAGNOSIS — E039 Hypothyroidism, unspecified: Secondary | ICD-10-CM | POA: Diagnosis not present

## 2021-03-23 DIAGNOSIS — R1311 Dysphagia, oral phase: Secondary | ICD-10-CM | POA: Diagnosis not present

## 2021-03-23 DIAGNOSIS — M6259 Muscle wasting and atrophy, not elsewhere classified, multiple sites: Secondary | ICD-10-CM | POA: Diagnosis not present

## 2021-03-23 DIAGNOSIS — J690 Pneumonitis due to inhalation of food and vomit: Secondary | ICD-10-CM | POA: Diagnosis not present

## 2021-03-23 DIAGNOSIS — J9601 Acute respiratory failure with hypoxia: Secondary | ICD-10-CM | POA: Diagnosis not present

## 2021-03-23 DIAGNOSIS — I10 Essential (primary) hypertension: Secondary | ICD-10-CM | POA: Diagnosis not present

## 2021-03-24 DIAGNOSIS — R1311 Dysphagia, oral phase: Secondary | ICD-10-CM | POA: Diagnosis not present

## 2021-03-24 DIAGNOSIS — E785 Hyperlipidemia, unspecified: Secondary | ICD-10-CM | POA: Diagnosis not present

## 2021-03-24 DIAGNOSIS — J690 Pneumonitis due to inhalation of food and vomit: Secondary | ICD-10-CM | POA: Diagnosis not present

## 2021-03-24 DIAGNOSIS — E039 Hypothyroidism, unspecified: Secondary | ICD-10-CM | POA: Diagnosis not present

## 2021-03-24 DIAGNOSIS — I6930 Unspecified sequelae of cerebral infarction: Secondary | ICD-10-CM | POA: Diagnosis not present

## 2021-03-24 DIAGNOSIS — J9601 Acute respiratory failure with hypoxia: Secondary | ICD-10-CM | POA: Diagnosis not present

## 2021-03-24 DIAGNOSIS — E46 Unspecified protein-calorie malnutrition: Secondary | ICD-10-CM | POA: Diagnosis not present

## 2021-03-24 DIAGNOSIS — R269 Unspecified abnormalities of gait and mobility: Secondary | ICD-10-CM | POA: Diagnosis not present

## 2021-03-24 DIAGNOSIS — Z48812 Encounter for surgical aftercare following surgery on the circulatory system: Secondary | ICD-10-CM | POA: Diagnosis not present

## 2021-03-24 DIAGNOSIS — I10 Essential (primary) hypertension: Secondary | ICD-10-CM | POA: Diagnosis not present

## 2021-03-24 DIAGNOSIS — M6259 Muscle wasting and atrophy, not elsewhere classified, multiple sites: Secondary | ICD-10-CM | POA: Diagnosis not present

## 2021-03-24 DIAGNOSIS — M6281 Muscle weakness (generalized): Secondary | ICD-10-CM | POA: Diagnosis not present

## 2021-03-25 DIAGNOSIS — M6281 Muscle weakness (generalized): Secondary | ICD-10-CM | POA: Diagnosis not present

## 2021-03-25 DIAGNOSIS — J690 Pneumonitis due to inhalation of food and vomit: Secondary | ICD-10-CM | POA: Diagnosis not present

## 2021-03-25 DIAGNOSIS — E039 Hypothyroidism, unspecified: Secondary | ICD-10-CM | POA: Diagnosis not present

## 2021-03-25 DIAGNOSIS — Z48812 Encounter for surgical aftercare following surgery on the circulatory system: Secondary | ICD-10-CM | POA: Diagnosis not present

## 2021-03-25 DIAGNOSIS — J9601 Acute respiratory failure with hypoxia: Secondary | ICD-10-CM | POA: Diagnosis not present

## 2021-03-25 DIAGNOSIS — I6930 Unspecified sequelae of cerebral infarction: Secondary | ICD-10-CM | POA: Diagnosis not present

## 2021-03-25 DIAGNOSIS — E46 Unspecified protein-calorie malnutrition: Secondary | ICD-10-CM | POA: Diagnosis not present

## 2021-03-25 DIAGNOSIS — R269 Unspecified abnormalities of gait and mobility: Secondary | ICD-10-CM | POA: Diagnosis not present

## 2021-03-25 DIAGNOSIS — R1311 Dysphagia, oral phase: Secondary | ICD-10-CM | POA: Diagnosis not present

## 2021-03-25 DIAGNOSIS — M6259 Muscle wasting and atrophy, not elsewhere classified, multiple sites: Secondary | ICD-10-CM | POA: Diagnosis not present

## 2021-03-25 DIAGNOSIS — E785 Hyperlipidemia, unspecified: Secondary | ICD-10-CM | POA: Diagnosis not present

## 2021-03-25 DIAGNOSIS — I10 Essential (primary) hypertension: Secondary | ICD-10-CM | POA: Diagnosis not present

## 2021-03-26 DIAGNOSIS — R1311 Dysphagia, oral phase: Secondary | ICD-10-CM | POA: Diagnosis not present

## 2021-03-26 DIAGNOSIS — I10 Essential (primary) hypertension: Secondary | ICD-10-CM | POA: Diagnosis not present

## 2021-03-26 DIAGNOSIS — E785 Hyperlipidemia, unspecified: Secondary | ICD-10-CM | POA: Diagnosis not present

## 2021-03-26 DIAGNOSIS — E46 Unspecified protein-calorie malnutrition: Secondary | ICD-10-CM | POA: Diagnosis not present

## 2021-03-26 DIAGNOSIS — R2689 Other abnormalities of gait and mobility: Secondary | ICD-10-CM | POA: Diagnosis not present

## 2021-03-26 DIAGNOSIS — M6281 Muscle weakness (generalized): Secondary | ICD-10-CM | POA: Diagnosis not present

## 2021-03-26 DIAGNOSIS — E039 Hypothyroidism, unspecified: Secondary | ICD-10-CM | POA: Diagnosis not present

## 2021-03-26 DIAGNOSIS — J690 Pneumonitis due to inhalation of food and vomit: Secondary | ICD-10-CM | POA: Diagnosis not present

## 2021-03-26 DIAGNOSIS — I6930 Unspecified sequelae of cerebral infarction: Secondary | ICD-10-CM | POA: Diagnosis not present

## 2021-03-26 DIAGNOSIS — J9601 Acute respiratory failure with hypoxia: Secondary | ICD-10-CM | POA: Diagnosis not present

## 2021-03-26 DIAGNOSIS — R269 Unspecified abnormalities of gait and mobility: Secondary | ICD-10-CM | POA: Diagnosis not present

## 2021-03-26 DIAGNOSIS — M6259 Muscle wasting and atrophy, not elsewhere classified, multiple sites: Secondary | ICD-10-CM | POA: Diagnosis not present

## 2021-03-28 DIAGNOSIS — E785 Hyperlipidemia, unspecified: Secondary | ICD-10-CM | POA: Diagnosis not present

## 2021-03-28 DIAGNOSIS — M6259 Muscle wasting and atrophy, not elsewhere classified, multiple sites: Secondary | ICD-10-CM | POA: Diagnosis not present

## 2021-03-28 DIAGNOSIS — J690 Pneumonitis due to inhalation of food and vomit: Secondary | ICD-10-CM | POA: Diagnosis not present

## 2021-03-28 DIAGNOSIS — I10 Essential (primary) hypertension: Secondary | ICD-10-CM | POA: Diagnosis not present

## 2021-03-28 DIAGNOSIS — R1311 Dysphagia, oral phase: Secondary | ICD-10-CM | POA: Diagnosis not present

## 2021-03-28 DIAGNOSIS — I6930 Unspecified sequelae of cerebral infarction: Secondary | ICD-10-CM | POA: Diagnosis not present

## 2021-03-28 DIAGNOSIS — E46 Unspecified protein-calorie malnutrition: Secondary | ICD-10-CM | POA: Diagnosis not present

## 2021-03-28 DIAGNOSIS — R269 Unspecified abnormalities of gait and mobility: Secondary | ICD-10-CM | POA: Diagnosis not present

## 2021-03-28 DIAGNOSIS — M6281 Muscle weakness (generalized): Secondary | ICD-10-CM | POA: Diagnosis not present

## 2021-03-28 DIAGNOSIS — R2689 Other abnormalities of gait and mobility: Secondary | ICD-10-CM | POA: Diagnosis not present

## 2021-03-28 DIAGNOSIS — E039 Hypothyroidism, unspecified: Secondary | ICD-10-CM | POA: Diagnosis not present

## 2021-03-28 DIAGNOSIS — J9601 Acute respiratory failure with hypoxia: Secondary | ICD-10-CM | POA: Diagnosis not present

## 2021-04-01 DIAGNOSIS — R2689 Other abnormalities of gait and mobility: Secondary | ICD-10-CM | POA: Diagnosis not present

## 2021-04-01 DIAGNOSIS — E785 Hyperlipidemia, unspecified: Secondary | ICD-10-CM | POA: Diagnosis not present

## 2021-04-01 DIAGNOSIS — M6259 Muscle wasting and atrophy, not elsewhere classified, multiple sites: Secondary | ICD-10-CM | POA: Diagnosis not present

## 2021-04-01 DIAGNOSIS — I6930 Unspecified sequelae of cerebral infarction: Secondary | ICD-10-CM | POA: Diagnosis not present

## 2021-04-01 DIAGNOSIS — R1311 Dysphagia, oral phase: Secondary | ICD-10-CM | POA: Diagnosis not present

## 2021-04-01 DIAGNOSIS — R269 Unspecified abnormalities of gait and mobility: Secondary | ICD-10-CM | POA: Diagnosis not present

## 2021-04-01 DIAGNOSIS — I10 Essential (primary) hypertension: Secondary | ICD-10-CM | POA: Diagnosis not present

## 2021-04-01 DIAGNOSIS — E039 Hypothyroidism, unspecified: Secondary | ICD-10-CM | POA: Diagnosis not present

## 2021-04-01 DIAGNOSIS — E46 Unspecified protein-calorie malnutrition: Secondary | ICD-10-CM | POA: Diagnosis not present

## 2021-04-01 DIAGNOSIS — J690 Pneumonitis due to inhalation of food and vomit: Secondary | ICD-10-CM | POA: Diagnosis not present

## 2021-04-01 DIAGNOSIS — J9601 Acute respiratory failure with hypoxia: Secondary | ICD-10-CM | POA: Diagnosis not present

## 2021-04-01 DIAGNOSIS — M6281 Muscle weakness (generalized): Secondary | ICD-10-CM | POA: Diagnosis not present

## 2021-04-02 DIAGNOSIS — E785 Hyperlipidemia, unspecified: Secondary | ICD-10-CM | POA: Diagnosis not present

## 2021-04-02 DIAGNOSIS — M6281 Muscle weakness (generalized): Secondary | ICD-10-CM | POA: Diagnosis not present

## 2021-04-02 DIAGNOSIS — R1311 Dysphagia, oral phase: Secondary | ICD-10-CM | POA: Diagnosis not present

## 2021-04-02 DIAGNOSIS — R2689 Other abnormalities of gait and mobility: Secondary | ICD-10-CM | POA: Diagnosis not present

## 2021-04-02 DIAGNOSIS — E039 Hypothyroidism, unspecified: Secondary | ICD-10-CM | POA: Diagnosis not present

## 2021-04-02 DIAGNOSIS — I6930 Unspecified sequelae of cerebral infarction: Secondary | ICD-10-CM | POA: Diagnosis not present

## 2021-04-02 DIAGNOSIS — R269 Unspecified abnormalities of gait and mobility: Secondary | ICD-10-CM | POA: Diagnosis not present

## 2021-04-02 DIAGNOSIS — J690 Pneumonitis due to inhalation of food and vomit: Secondary | ICD-10-CM | POA: Diagnosis not present

## 2021-04-02 DIAGNOSIS — E46 Unspecified protein-calorie malnutrition: Secondary | ICD-10-CM | POA: Diagnosis not present

## 2021-04-02 DIAGNOSIS — M6259 Muscle wasting and atrophy, not elsewhere classified, multiple sites: Secondary | ICD-10-CM | POA: Diagnosis not present

## 2021-04-02 DIAGNOSIS — J9601 Acute respiratory failure with hypoxia: Secondary | ICD-10-CM | POA: Diagnosis not present

## 2021-04-02 DIAGNOSIS — I10 Essential (primary) hypertension: Secondary | ICD-10-CM | POA: Diagnosis not present

## 2021-04-08 DIAGNOSIS — Z8673 Personal history of transient ischemic attack (TIA), and cerebral infarction without residual deficits: Secondary | ICD-10-CM | POA: Diagnosis not present

## 2021-04-08 DIAGNOSIS — E039 Hypothyroidism, unspecified: Secondary | ICD-10-CM | POA: Diagnosis not present

## 2021-04-08 DIAGNOSIS — E785 Hyperlipidemia, unspecified: Secondary | ICD-10-CM | POA: Diagnosis not present

## 2021-04-11 DIAGNOSIS — I69311 Memory deficit following cerebral infarction: Secondary | ICD-10-CM | POA: Diagnosis not present

## 2021-04-11 DIAGNOSIS — R451 Restlessness and agitation: Secondary | ICD-10-CM | POA: Diagnosis not present

## 2021-04-18 DIAGNOSIS — I69311 Memory deficit following cerebral infarction: Secondary | ICD-10-CM | POA: Diagnosis not present

## 2021-04-18 DIAGNOSIS — R451 Restlessness and agitation: Secondary | ICD-10-CM | POA: Diagnosis not present

## 2021-04-30 DIAGNOSIS — E039 Hypothyroidism, unspecified: Secondary | ICD-10-CM | POA: Diagnosis not present

## 2021-05-01 DIAGNOSIS — U071 COVID-19: Secondary | ICD-10-CM | POA: Diagnosis not present

## 2021-05-13 DIAGNOSIS — E785 Hyperlipidemia, unspecified: Secondary | ICD-10-CM | POA: Diagnosis not present

## 2021-05-13 DIAGNOSIS — E039 Hypothyroidism, unspecified: Secondary | ICD-10-CM | POA: Diagnosis not present

## 2021-06-05 DIAGNOSIS — I10 Essential (primary) hypertension: Secondary | ICD-10-CM | POA: Diagnosis not present

## 2021-06-05 DIAGNOSIS — J9601 Acute respiratory failure with hypoxia: Secondary | ICD-10-CM | POA: Diagnosis not present

## 2021-06-05 DIAGNOSIS — I6930 Unspecified sequelae of cerebral infarction: Secondary | ICD-10-CM | POA: Diagnosis not present

## 2021-06-05 DIAGNOSIS — J690 Pneumonitis due to inhalation of food and vomit: Secondary | ICD-10-CM | POA: Diagnosis not present

## 2021-06-05 DIAGNOSIS — Z79899 Other long term (current) drug therapy: Secondary | ICD-10-CM | POA: Diagnosis not present

## 2021-06-05 DIAGNOSIS — M6281 Muscle weakness (generalized): Secondary | ICD-10-CM | POA: Diagnosis not present

## 2021-06-05 DIAGNOSIS — R2689 Other abnormalities of gait and mobility: Secondary | ICD-10-CM | POA: Diagnosis not present

## 2021-06-05 DIAGNOSIS — M6259 Muscle wasting and atrophy, not elsewhere classified, multiple sites: Secondary | ICD-10-CM | POA: Diagnosis not present

## 2021-06-05 DIAGNOSIS — R269 Unspecified abnormalities of gait and mobility: Secondary | ICD-10-CM | POA: Diagnosis not present

## 2021-06-05 DIAGNOSIS — E039 Hypothyroidism, unspecified: Secondary | ICD-10-CM | POA: Diagnosis not present

## 2021-06-05 DIAGNOSIS — E46 Unspecified protein-calorie malnutrition: Secondary | ICD-10-CM | POA: Diagnosis not present

## 2021-06-05 DIAGNOSIS — E785 Hyperlipidemia, unspecified: Secondary | ICD-10-CM | POA: Diagnosis not present

## 2021-06-05 DIAGNOSIS — R1311 Dysphagia, oral phase: Secondary | ICD-10-CM | POA: Diagnosis not present

## 2021-06-10 DIAGNOSIS — J9601 Acute respiratory failure with hypoxia: Secondary | ICD-10-CM | POA: Diagnosis not present

## 2021-06-10 DIAGNOSIS — E46 Unspecified protein-calorie malnutrition: Secondary | ICD-10-CM | POA: Diagnosis not present

## 2021-06-10 DIAGNOSIS — I69311 Memory deficit following cerebral infarction: Secondary | ICD-10-CM | POA: Diagnosis not present

## 2021-06-10 DIAGNOSIS — J690 Pneumonitis due to inhalation of food and vomit: Secondary | ICD-10-CM | POA: Diagnosis not present

## 2021-06-10 DIAGNOSIS — R269 Unspecified abnormalities of gait and mobility: Secondary | ICD-10-CM | POA: Diagnosis not present

## 2021-06-10 DIAGNOSIS — R1311 Dysphagia, oral phase: Secondary | ICD-10-CM | POA: Diagnosis not present

## 2021-06-10 DIAGNOSIS — R2689 Other abnormalities of gait and mobility: Secondary | ICD-10-CM | POA: Diagnosis not present

## 2021-06-10 DIAGNOSIS — M6281 Muscle weakness (generalized): Secondary | ICD-10-CM | POA: Diagnosis not present

## 2021-06-10 DIAGNOSIS — I10 Essential (primary) hypertension: Secondary | ICD-10-CM | POA: Diagnosis not present

## 2021-06-10 DIAGNOSIS — E039 Hypothyroidism, unspecified: Secondary | ICD-10-CM | POA: Diagnosis not present

## 2021-06-10 DIAGNOSIS — M6259 Muscle wasting and atrophy, not elsewhere classified, multiple sites: Secondary | ICD-10-CM | POA: Diagnosis not present

## 2021-06-10 DIAGNOSIS — E785 Hyperlipidemia, unspecified: Secondary | ICD-10-CM | POA: Diagnosis not present

## 2021-06-10 DIAGNOSIS — I6930 Unspecified sequelae of cerebral infarction: Secondary | ICD-10-CM | POA: Diagnosis not present

## 2021-06-11 DIAGNOSIS — M6281 Muscle weakness (generalized): Secondary | ICD-10-CM | POA: Diagnosis not present

## 2021-06-11 DIAGNOSIS — R1311 Dysphagia, oral phase: Secondary | ICD-10-CM | POA: Diagnosis not present

## 2021-06-11 DIAGNOSIS — J9601 Acute respiratory failure with hypoxia: Secondary | ICD-10-CM | POA: Diagnosis not present

## 2021-06-11 DIAGNOSIS — J690 Pneumonitis due to inhalation of food and vomit: Secondary | ICD-10-CM | POA: Diagnosis not present

## 2021-06-11 DIAGNOSIS — E46 Unspecified protein-calorie malnutrition: Secondary | ICD-10-CM | POA: Diagnosis not present

## 2021-06-11 DIAGNOSIS — E785 Hyperlipidemia, unspecified: Secondary | ICD-10-CM | POA: Diagnosis not present

## 2021-06-11 DIAGNOSIS — M6259 Muscle wasting and atrophy, not elsewhere classified, multiple sites: Secondary | ICD-10-CM | POA: Diagnosis not present

## 2021-06-11 DIAGNOSIS — R269 Unspecified abnormalities of gait and mobility: Secondary | ICD-10-CM | POA: Diagnosis not present

## 2021-06-11 DIAGNOSIS — I6930 Unspecified sequelae of cerebral infarction: Secondary | ICD-10-CM | POA: Diagnosis not present

## 2021-06-11 DIAGNOSIS — R2689 Other abnormalities of gait and mobility: Secondary | ICD-10-CM | POA: Diagnosis not present

## 2021-06-11 DIAGNOSIS — I10 Essential (primary) hypertension: Secondary | ICD-10-CM | POA: Diagnosis not present

## 2021-06-11 DIAGNOSIS — E039 Hypothyroidism, unspecified: Secondary | ICD-10-CM | POA: Diagnosis not present

## 2021-06-12 DIAGNOSIS — E785 Hyperlipidemia, unspecified: Secondary | ICD-10-CM | POA: Diagnosis not present

## 2021-06-12 DIAGNOSIS — I6930 Unspecified sequelae of cerebral infarction: Secondary | ICD-10-CM | POA: Diagnosis not present

## 2021-06-12 DIAGNOSIS — M6281 Muscle weakness (generalized): Secondary | ICD-10-CM | POA: Diagnosis not present

## 2021-06-12 DIAGNOSIS — J9601 Acute respiratory failure with hypoxia: Secondary | ICD-10-CM | POA: Diagnosis not present

## 2021-06-12 DIAGNOSIS — E46 Unspecified protein-calorie malnutrition: Secondary | ICD-10-CM | POA: Diagnosis not present

## 2021-06-12 DIAGNOSIS — R1311 Dysphagia, oral phase: Secondary | ICD-10-CM | POA: Diagnosis not present

## 2021-06-12 DIAGNOSIS — R269 Unspecified abnormalities of gait and mobility: Secondary | ICD-10-CM | POA: Diagnosis not present

## 2021-06-12 DIAGNOSIS — I10 Essential (primary) hypertension: Secondary | ICD-10-CM | POA: Diagnosis not present

## 2021-06-12 DIAGNOSIS — R2689 Other abnormalities of gait and mobility: Secondary | ICD-10-CM | POA: Diagnosis not present

## 2021-06-12 DIAGNOSIS — J690 Pneumonitis due to inhalation of food and vomit: Secondary | ICD-10-CM | POA: Diagnosis not present

## 2021-06-12 DIAGNOSIS — M6259 Muscle wasting and atrophy, not elsewhere classified, multiple sites: Secondary | ICD-10-CM | POA: Diagnosis not present

## 2021-06-12 DIAGNOSIS — E039 Hypothyroidism, unspecified: Secondary | ICD-10-CM | POA: Diagnosis not present

## 2021-06-13 DIAGNOSIS — R1311 Dysphagia, oral phase: Secondary | ICD-10-CM | POA: Diagnosis not present

## 2021-06-13 DIAGNOSIS — R2689 Other abnormalities of gait and mobility: Secondary | ICD-10-CM | POA: Diagnosis not present

## 2021-06-13 DIAGNOSIS — I6930 Unspecified sequelae of cerebral infarction: Secondary | ICD-10-CM | POA: Diagnosis not present

## 2021-06-13 DIAGNOSIS — E46 Unspecified protein-calorie malnutrition: Secondary | ICD-10-CM | POA: Diagnosis not present

## 2021-06-13 DIAGNOSIS — E785 Hyperlipidemia, unspecified: Secondary | ICD-10-CM | POA: Diagnosis not present

## 2021-06-13 DIAGNOSIS — R269 Unspecified abnormalities of gait and mobility: Secondary | ICD-10-CM | POA: Diagnosis not present

## 2021-06-13 DIAGNOSIS — I10 Essential (primary) hypertension: Secondary | ICD-10-CM | POA: Diagnosis not present

## 2021-06-13 DIAGNOSIS — J9601 Acute respiratory failure with hypoxia: Secondary | ICD-10-CM | POA: Diagnosis not present

## 2021-06-13 DIAGNOSIS — E039 Hypothyroidism, unspecified: Secondary | ICD-10-CM | POA: Diagnosis not present

## 2021-06-13 DIAGNOSIS — M6281 Muscle weakness (generalized): Secondary | ICD-10-CM | POA: Diagnosis not present

## 2021-06-13 DIAGNOSIS — M6259 Muscle wasting and atrophy, not elsewhere classified, multiple sites: Secondary | ICD-10-CM | POA: Diagnosis not present

## 2021-06-13 DIAGNOSIS — J690 Pneumonitis due to inhalation of food and vomit: Secondary | ICD-10-CM | POA: Diagnosis not present

## 2021-06-17 DIAGNOSIS — R1311 Dysphagia, oral phase: Secondary | ICD-10-CM | POA: Diagnosis not present

## 2021-06-17 DIAGNOSIS — M6259 Muscle wasting and atrophy, not elsewhere classified, multiple sites: Secondary | ICD-10-CM | POA: Diagnosis not present

## 2021-06-17 DIAGNOSIS — E039 Hypothyroidism, unspecified: Secondary | ICD-10-CM | POA: Diagnosis not present

## 2021-06-17 DIAGNOSIS — M6281 Muscle weakness (generalized): Secondary | ICD-10-CM | POA: Diagnosis not present

## 2021-06-17 DIAGNOSIS — I6930 Unspecified sequelae of cerebral infarction: Secondary | ICD-10-CM | POA: Diagnosis not present

## 2021-06-17 DIAGNOSIS — Z8673 Personal history of transient ischemic attack (TIA), and cerebral infarction without residual deficits: Secondary | ICD-10-CM | POA: Diagnosis not present

## 2021-06-17 DIAGNOSIS — R5381 Other malaise: Secondary | ICD-10-CM | POA: Diagnosis not present

## 2021-06-17 DIAGNOSIS — I10 Essential (primary) hypertension: Secondary | ICD-10-CM | POA: Diagnosis not present

## 2021-06-17 DIAGNOSIS — E785 Hyperlipidemia, unspecified: Secondary | ICD-10-CM | POA: Diagnosis not present

## 2021-06-17 DIAGNOSIS — J690 Pneumonitis due to inhalation of food and vomit: Secondary | ICD-10-CM | POA: Diagnosis not present

## 2021-06-17 DIAGNOSIS — J9601 Acute respiratory failure with hypoxia: Secondary | ICD-10-CM | POA: Diagnosis not present

## 2021-06-17 DIAGNOSIS — E46 Unspecified protein-calorie malnutrition: Secondary | ICD-10-CM | POA: Diagnosis not present

## 2021-06-17 DIAGNOSIS — R2689 Other abnormalities of gait and mobility: Secondary | ICD-10-CM | POA: Diagnosis not present

## 2021-06-17 DIAGNOSIS — R269 Unspecified abnormalities of gait and mobility: Secondary | ICD-10-CM | POA: Diagnosis not present

## 2021-06-17 DIAGNOSIS — I700 Atherosclerosis of aorta: Secondary | ICD-10-CM | POA: Diagnosis not present

## 2021-06-19 DIAGNOSIS — M6259 Muscle wasting and atrophy, not elsewhere classified, multiple sites: Secondary | ICD-10-CM | POA: Diagnosis not present

## 2021-06-19 DIAGNOSIS — J690 Pneumonitis due to inhalation of food and vomit: Secondary | ICD-10-CM | POA: Diagnosis not present

## 2021-06-19 DIAGNOSIS — I6930 Unspecified sequelae of cerebral infarction: Secondary | ICD-10-CM | POA: Diagnosis not present

## 2021-06-19 DIAGNOSIS — R269 Unspecified abnormalities of gait and mobility: Secondary | ICD-10-CM | POA: Diagnosis not present

## 2021-06-19 DIAGNOSIS — M6281 Muscle weakness (generalized): Secondary | ICD-10-CM | POA: Diagnosis not present

## 2021-06-19 DIAGNOSIS — R1311 Dysphagia, oral phase: Secondary | ICD-10-CM | POA: Diagnosis not present

## 2021-06-19 DIAGNOSIS — J9601 Acute respiratory failure with hypoxia: Secondary | ICD-10-CM | POA: Diagnosis not present

## 2021-06-19 DIAGNOSIS — R2689 Other abnormalities of gait and mobility: Secondary | ICD-10-CM | POA: Diagnosis not present

## 2021-06-19 DIAGNOSIS — E039 Hypothyroidism, unspecified: Secondary | ICD-10-CM | POA: Diagnosis not present

## 2021-06-19 DIAGNOSIS — E785 Hyperlipidemia, unspecified: Secondary | ICD-10-CM | POA: Diagnosis not present

## 2021-06-19 DIAGNOSIS — I10 Essential (primary) hypertension: Secondary | ICD-10-CM | POA: Diagnosis not present

## 2021-06-19 DIAGNOSIS — E46 Unspecified protein-calorie malnutrition: Secondary | ICD-10-CM | POA: Diagnosis not present

## 2021-06-21 DIAGNOSIS — R1311 Dysphagia, oral phase: Secondary | ICD-10-CM | POA: Diagnosis not present

## 2021-06-21 DIAGNOSIS — J690 Pneumonitis due to inhalation of food and vomit: Secondary | ICD-10-CM | POA: Diagnosis not present

## 2021-06-21 DIAGNOSIS — E46 Unspecified protein-calorie malnutrition: Secondary | ICD-10-CM | POA: Diagnosis not present

## 2021-06-21 DIAGNOSIS — I6930 Unspecified sequelae of cerebral infarction: Secondary | ICD-10-CM | POA: Diagnosis not present

## 2021-06-21 DIAGNOSIS — R269 Unspecified abnormalities of gait and mobility: Secondary | ICD-10-CM | POA: Diagnosis not present

## 2021-06-21 DIAGNOSIS — I10 Essential (primary) hypertension: Secondary | ICD-10-CM | POA: Diagnosis not present

## 2021-06-21 DIAGNOSIS — E039 Hypothyroidism, unspecified: Secondary | ICD-10-CM | POA: Diagnosis not present

## 2021-06-21 DIAGNOSIS — R2689 Other abnormalities of gait and mobility: Secondary | ICD-10-CM | POA: Diagnosis not present

## 2021-06-21 DIAGNOSIS — M6281 Muscle weakness (generalized): Secondary | ICD-10-CM | POA: Diagnosis not present

## 2021-06-21 DIAGNOSIS — M6259 Muscle wasting and atrophy, not elsewhere classified, multiple sites: Secondary | ICD-10-CM | POA: Diagnosis not present

## 2021-06-21 DIAGNOSIS — J9601 Acute respiratory failure with hypoxia: Secondary | ICD-10-CM | POA: Diagnosis not present

## 2021-06-21 DIAGNOSIS — E785 Hyperlipidemia, unspecified: Secondary | ICD-10-CM | POA: Diagnosis not present

## 2021-06-23 DIAGNOSIS — R269 Unspecified abnormalities of gait and mobility: Secondary | ICD-10-CM | POA: Diagnosis not present

## 2021-06-23 DIAGNOSIS — E039 Hypothyroidism, unspecified: Secondary | ICD-10-CM | POA: Diagnosis not present

## 2021-06-23 DIAGNOSIS — J690 Pneumonitis due to inhalation of food and vomit: Secondary | ICD-10-CM | POA: Diagnosis not present

## 2021-06-23 DIAGNOSIS — E785 Hyperlipidemia, unspecified: Secondary | ICD-10-CM | POA: Diagnosis not present

## 2021-06-23 DIAGNOSIS — I10 Essential (primary) hypertension: Secondary | ICD-10-CM | POA: Diagnosis not present

## 2021-06-23 DIAGNOSIS — M6259 Muscle wasting and atrophy, not elsewhere classified, multiple sites: Secondary | ICD-10-CM | POA: Diagnosis not present

## 2021-06-23 DIAGNOSIS — R2689 Other abnormalities of gait and mobility: Secondary | ICD-10-CM | POA: Diagnosis not present

## 2021-06-23 DIAGNOSIS — M6281 Muscle weakness (generalized): Secondary | ICD-10-CM | POA: Diagnosis not present

## 2021-06-23 DIAGNOSIS — R1311 Dysphagia, oral phase: Secondary | ICD-10-CM | POA: Diagnosis not present

## 2021-06-23 DIAGNOSIS — I6930 Unspecified sequelae of cerebral infarction: Secondary | ICD-10-CM | POA: Diagnosis not present

## 2021-06-23 DIAGNOSIS — E46 Unspecified protein-calorie malnutrition: Secondary | ICD-10-CM | POA: Diagnosis not present

## 2021-06-23 DIAGNOSIS — J9601 Acute respiratory failure with hypoxia: Secondary | ICD-10-CM | POA: Diagnosis not present

## 2021-06-24 DIAGNOSIS — Z1389 Encounter for screening for other disorder: Secondary | ICD-10-CM | POA: Diagnosis not present

## 2021-06-24 DIAGNOSIS — E46 Unspecified protein-calorie malnutrition: Secondary | ICD-10-CM | POA: Diagnosis not present

## 2021-06-24 DIAGNOSIS — R269 Unspecified abnormalities of gait and mobility: Secondary | ICD-10-CM | POA: Diagnosis not present

## 2021-06-24 DIAGNOSIS — J9601 Acute respiratory failure with hypoxia: Secondary | ICD-10-CM | POA: Diagnosis not present

## 2021-06-24 DIAGNOSIS — I10 Essential (primary) hypertension: Secondary | ICD-10-CM | POA: Diagnosis not present

## 2021-06-24 DIAGNOSIS — E785 Hyperlipidemia, unspecified: Secondary | ICD-10-CM | POA: Diagnosis not present

## 2021-06-24 DIAGNOSIS — E039 Hypothyroidism, unspecified: Secondary | ICD-10-CM | POA: Diagnosis not present

## 2021-06-24 DIAGNOSIS — M6259 Muscle wasting and atrophy, not elsewhere classified, multiple sites: Secondary | ICD-10-CM | POA: Diagnosis not present

## 2021-06-24 DIAGNOSIS — Z0000 Encounter for general adult medical examination without abnormal findings: Secondary | ICD-10-CM | POA: Diagnosis not present

## 2021-06-24 DIAGNOSIS — Z139 Encounter for screening, unspecified: Secondary | ICD-10-CM | POA: Diagnosis not present

## 2021-06-24 DIAGNOSIS — J690 Pneumonitis due to inhalation of food and vomit: Secondary | ICD-10-CM | POA: Diagnosis not present

## 2021-06-24 DIAGNOSIS — I6930 Unspecified sequelae of cerebral infarction: Secondary | ICD-10-CM | POA: Diagnosis not present

## 2021-06-24 DIAGNOSIS — M6281 Muscle weakness (generalized): Secondary | ICD-10-CM | POA: Diagnosis not present

## 2021-06-24 DIAGNOSIS — R1311 Dysphagia, oral phase: Secondary | ICD-10-CM | POA: Diagnosis not present

## 2021-06-24 DIAGNOSIS — R2689 Other abnormalities of gait and mobility: Secondary | ICD-10-CM | POA: Diagnosis not present

## 2021-06-25 DIAGNOSIS — I6930 Unspecified sequelae of cerebral infarction: Secondary | ICD-10-CM | POA: Diagnosis not present

## 2021-06-25 DIAGNOSIS — I10 Essential (primary) hypertension: Secondary | ICD-10-CM | POA: Diagnosis not present

## 2021-06-25 DIAGNOSIS — J690 Pneumonitis due to inhalation of food and vomit: Secondary | ICD-10-CM | POA: Diagnosis not present

## 2021-06-25 DIAGNOSIS — R2689 Other abnormalities of gait and mobility: Secondary | ICD-10-CM | POA: Diagnosis not present

## 2021-06-25 DIAGNOSIS — R269 Unspecified abnormalities of gait and mobility: Secondary | ICD-10-CM | POA: Diagnosis not present

## 2021-06-25 DIAGNOSIS — K120 Recurrent oral aphthae: Secondary | ICD-10-CM | POA: Diagnosis not present

## 2021-06-25 DIAGNOSIS — E039 Hypothyroidism, unspecified: Secondary | ICD-10-CM | POA: Diagnosis not present

## 2021-06-25 DIAGNOSIS — M6259 Muscle wasting and atrophy, not elsewhere classified, multiple sites: Secondary | ICD-10-CM | POA: Diagnosis not present

## 2021-06-25 DIAGNOSIS — M6281 Muscle weakness (generalized): Secondary | ICD-10-CM | POA: Diagnosis not present

## 2021-06-25 DIAGNOSIS — J9601 Acute respiratory failure with hypoxia: Secondary | ICD-10-CM | POA: Diagnosis not present

## 2021-06-25 DIAGNOSIS — E46 Unspecified protein-calorie malnutrition: Secondary | ICD-10-CM | POA: Diagnosis not present

## 2021-06-25 DIAGNOSIS — R1311 Dysphagia, oral phase: Secondary | ICD-10-CM | POA: Diagnosis not present

## 2021-06-25 DIAGNOSIS — E785 Hyperlipidemia, unspecified: Secondary | ICD-10-CM | POA: Diagnosis not present

## 2021-07-08 DIAGNOSIS — I69311 Memory deficit following cerebral infarction: Secondary | ICD-10-CM | POA: Diagnosis not present

## 2021-07-23 DIAGNOSIS — E039 Hypothyroidism, unspecified: Secondary | ICD-10-CM | POA: Diagnosis not present

## 2021-07-23 DIAGNOSIS — E785 Hyperlipidemia, unspecified: Secondary | ICD-10-CM | POA: Diagnosis not present

## 2021-07-23 DIAGNOSIS — I700 Atherosclerosis of aorta: Secondary | ICD-10-CM | POA: Diagnosis not present

## 2021-08-08 DIAGNOSIS — R451 Restlessness and agitation: Secondary | ICD-10-CM | POA: Diagnosis not present

## 2021-08-08 DIAGNOSIS — F01511 Vascular dementia, unspecified severity, with agitation: Secondary | ICD-10-CM | POA: Diagnosis not present

## 2021-08-08 DIAGNOSIS — I69311 Memory deficit following cerebral infarction: Secondary | ICD-10-CM | POA: Diagnosis not present

## 2021-08-11 DIAGNOSIS — R6889 Other general symptoms and signs: Secondary | ICD-10-CM | POA: Diagnosis not present

## 2021-08-14 DIAGNOSIS — E785 Hyperlipidemia, unspecified: Secondary | ICD-10-CM | POA: Diagnosis not present

## 2021-08-20 DIAGNOSIS — H6120 Impacted cerumen, unspecified ear: Secondary | ICD-10-CM | POA: Diagnosis not present

## 2021-08-22 DIAGNOSIS — F01511 Vascular dementia, unspecified severity, with agitation: Secondary | ICD-10-CM | POA: Diagnosis not present

## 2021-08-22 DIAGNOSIS — I69311 Memory deficit following cerebral infarction: Secondary | ICD-10-CM | POA: Diagnosis not present

## 2021-08-26 DIAGNOSIS — I700 Atherosclerosis of aorta: Secondary | ICD-10-CM | POA: Diagnosis not present

## 2021-08-26 DIAGNOSIS — Z8673 Personal history of transient ischemic attack (TIA), and cerebral infarction without residual deficits: Secondary | ICD-10-CM | POA: Diagnosis not present

## 2021-08-26 DIAGNOSIS — F03918 Unspecified dementia, unspecified severity, with other behavioral disturbance: Secondary | ICD-10-CM | POA: Diagnosis not present

## 2021-09-04 DIAGNOSIS — I69311 Memory deficit following cerebral infarction: Secondary | ICD-10-CM | POA: Diagnosis not present

## 2021-09-04 DIAGNOSIS — F01511 Vascular dementia, unspecified severity, with agitation: Secondary | ICD-10-CM | POA: Diagnosis not present

## 2021-09-09 DIAGNOSIS — M25572 Pain in left ankle and joints of left foot: Secondary | ICD-10-CM | POA: Diagnosis not present

## 2021-09-09 DIAGNOSIS — M79672 Pain in left foot: Secondary | ICD-10-CM | POA: Diagnosis not present

## 2021-09-10 DIAGNOSIS — S93402A Sprain of unspecified ligament of left ankle, initial encounter: Secondary | ICD-10-CM | POA: Diagnosis not present

## 2021-09-16 DIAGNOSIS — R269 Unspecified abnormalities of gait and mobility: Secondary | ICD-10-CM | POA: Diagnosis not present

## 2021-09-16 DIAGNOSIS — J9601 Acute respiratory failure with hypoxia: Secondary | ICD-10-CM | POA: Diagnosis not present

## 2021-09-16 DIAGNOSIS — R2689 Other abnormalities of gait and mobility: Secondary | ICD-10-CM | POA: Diagnosis not present

## 2021-09-16 DIAGNOSIS — M6281 Muscle weakness (generalized): Secondary | ICD-10-CM | POA: Diagnosis not present

## 2021-09-16 DIAGNOSIS — E46 Unspecified protein-calorie malnutrition: Secondary | ICD-10-CM | POA: Diagnosis not present

## 2021-09-16 DIAGNOSIS — Z950 Presence of cardiac pacemaker: Secondary | ICD-10-CM | POA: Diagnosis not present

## 2021-09-16 DIAGNOSIS — I6930 Unspecified sequelae of cerebral infarction: Secondary | ICD-10-CM | POA: Diagnosis not present

## 2021-09-16 DIAGNOSIS — M6259 Muscle wasting and atrophy, not elsewhere classified, multiple sites: Secondary | ICD-10-CM | POA: Diagnosis not present

## 2021-09-16 DIAGNOSIS — R1311 Dysphagia, oral phase: Secondary | ICD-10-CM | POA: Diagnosis not present

## 2021-09-16 DIAGNOSIS — E785 Hyperlipidemia, unspecified: Secondary | ICD-10-CM | POA: Diagnosis not present

## 2021-09-16 DIAGNOSIS — I10 Essential (primary) hypertension: Secondary | ICD-10-CM | POA: Diagnosis not present

## 2021-09-16 DIAGNOSIS — E039 Hypothyroidism, unspecified: Secondary | ICD-10-CM | POA: Diagnosis not present

## 2021-09-17 DIAGNOSIS — R269 Unspecified abnormalities of gait and mobility: Secondary | ICD-10-CM | POA: Diagnosis not present

## 2021-09-17 DIAGNOSIS — M6281 Muscle weakness (generalized): Secondary | ICD-10-CM | POA: Diagnosis not present

## 2021-09-17 DIAGNOSIS — M6259 Muscle wasting and atrophy, not elsewhere classified, multiple sites: Secondary | ICD-10-CM | POA: Diagnosis not present

## 2021-09-17 DIAGNOSIS — J9601 Acute respiratory failure with hypoxia: Secondary | ICD-10-CM | POA: Diagnosis not present

## 2021-09-17 DIAGNOSIS — E785 Hyperlipidemia, unspecified: Secondary | ICD-10-CM | POA: Diagnosis not present

## 2021-09-17 DIAGNOSIS — I10 Essential (primary) hypertension: Secondary | ICD-10-CM | POA: Diagnosis not present

## 2021-09-17 DIAGNOSIS — R2689 Other abnormalities of gait and mobility: Secondary | ICD-10-CM | POA: Diagnosis not present

## 2021-09-17 DIAGNOSIS — E039 Hypothyroidism, unspecified: Secondary | ICD-10-CM | POA: Diagnosis not present

## 2021-09-17 DIAGNOSIS — I6930 Unspecified sequelae of cerebral infarction: Secondary | ICD-10-CM | POA: Diagnosis not present

## 2021-09-17 DIAGNOSIS — R1311 Dysphagia, oral phase: Secondary | ICD-10-CM | POA: Diagnosis not present

## 2021-09-17 DIAGNOSIS — Z950 Presence of cardiac pacemaker: Secondary | ICD-10-CM | POA: Diagnosis not present

## 2021-09-17 DIAGNOSIS — E46 Unspecified protein-calorie malnutrition: Secondary | ICD-10-CM | POA: Diagnosis not present

## 2021-09-18 DIAGNOSIS — M6281 Muscle weakness (generalized): Secondary | ICD-10-CM | POA: Diagnosis not present

## 2021-09-18 DIAGNOSIS — J9601 Acute respiratory failure with hypoxia: Secondary | ICD-10-CM | POA: Diagnosis not present

## 2021-09-18 DIAGNOSIS — E785 Hyperlipidemia, unspecified: Secondary | ICD-10-CM | POA: Diagnosis not present

## 2021-09-18 DIAGNOSIS — M6259 Muscle wasting and atrophy, not elsewhere classified, multiple sites: Secondary | ICD-10-CM | POA: Diagnosis not present

## 2021-09-18 DIAGNOSIS — R269 Unspecified abnormalities of gait and mobility: Secondary | ICD-10-CM | POA: Diagnosis not present

## 2021-09-18 DIAGNOSIS — I6930 Unspecified sequelae of cerebral infarction: Secondary | ICD-10-CM | POA: Diagnosis not present

## 2021-09-18 DIAGNOSIS — E46 Unspecified protein-calorie malnutrition: Secondary | ICD-10-CM | POA: Diagnosis not present

## 2021-09-18 DIAGNOSIS — R2689 Other abnormalities of gait and mobility: Secondary | ICD-10-CM | POA: Diagnosis not present

## 2021-09-18 DIAGNOSIS — E039 Hypothyroidism, unspecified: Secondary | ICD-10-CM | POA: Diagnosis not present

## 2021-09-18 DIAGNOSIS — Z950 Presence of cardiac pacemaker: Secondary | ICD-10-CM | POA: Diagnosis not present

## 2021-09-18 DIAGNOSIS — R1311 Dysphagia, oral phase: Secondary | ICD-10-CM | POA: Diagnosis not present

## 2021-09-18 DIAGNOSIS — I10 Essential (primary) hypertension: Secondary | ICD-10-CM | POA: Diagnosis not present

## 2021-09-19 DIAGNOSIS — E039 Hypothyroidism, unspecified: Secondary | ICD-10-CM | POA: Diagnosis not present

## 2021-09-19 DIAGNOSIS — R2689 Other abnormalities of gait and mobility: Secondary | ICD-10-CM | POA: Diagnosis not present

## 2021-09-19 DIAGNOSIS — M6281 Muscle weakness (generalized): Secondary | ICD-10-CM | POA: Diagnosis not present

## 2021-09-19 DIAGNOSIS — R1311 Dysphagia, oral phase: Secondary | ICD-10-CM | POA: Diagnosis not present

## 2021-09-19 DIAGNOSIS — J9601 Acute respiratory failure with hypoxia: Secondary | ICD-10-CM | POA: Diagnosis not present

## 2021-09-19 DIAGNOSIS — M6259 Muscle wasting and atrophy, not elsewhere classified, multiple sites: Secondary | ICD-10-CM | POA: Diagnosis not present

## 2021-09-19 DIAGNOSIS — R269 Unspecified abnormalities of gait and mobility: Secondary | ICD-10-CM | POA: Diagnosis not present

## 2021-09-19 DIAGNOSIS — E46 Unspecified protein-calorie malnutrition: Secondary | ICD-10-CM | POA: Diagnosis not present

## 2021-09-19 DIAGNOSIS — E785 Hyperlipidemia, unspecified: Secondary | ICD-10-CM | POA: Diagnosis not present

## 2021-09-19 DIAGNOSIS — Z950 Presence of cardiac pacemaker: Secondary | ICD-10-CM | POA: Diagnosis not present

## 2021-09-19 DIAGNOSIS — I6930 Unspecified sequelae of cerebral infarction: Secondary | ICD-10-CM | POA: Diagnosis not present

## 2021-09-19 DIAGNOSIS — I10 Essential (primary) hypertension: Secondary | ICD-10-CM | POA: Diagnosis not present

## 2021-09-22 DIAGNOSIS — R2689 Other abnormalities of gait and mobility: Secondary | ICD-10-CM | POA: Diagnosis not present

## 2021-09-22 DIAGNOSIS — I10 Essential (primary) hypertension: Secondary | ICD-10-CM | POA: Diagnosis not present

## 2021-09-22 DIAGNOSIS — J9601 Acute respiratory failure with hypoxia: Secondary | ICD-10-CM | POA: Diagnosis not present

## 2021-09-22 DIAGNOSIS — Z950 Presence of cardiac pacemaker: Secondary | ICD-10-CM | POA: Diagnosis not present

## 2021-09-22 DIAGNOSIS — M6259 Muscle wasting and atrophy, not elsewhere classified, multiple sites: Secondary | ICD-10-CM | POA: Diagnosis not present

## 2021-09-22 DIAGNOSIS — E46 Unspecified protein-calorie malnutrition: Secondary | ICD-10-CM | POA: Diagnosis not present

## 2021-09-22 DIAGNOSIS — E785 Hyperlipidemia, unspecified: Secondary | ICD-10-CM | POA: Diagnosis not present

## 2021-09-22 DIAGNOSIS — E039 Hypothyroidism, unspecified: Secondary | ICD-10-CM | POA: Diagnosis not present

## 2021-09-22 DIAGNOSIS — M6281 Muscle weakness (generalized): Secondary | ICD-10-CM | POA: Diagnosis not present

## 2021-09-22 DIAGNOSIS — R1311 Dysphagia, oral phase: Secondary | ICD-10-CM | POA: Diagnosis not present

## 2021-09-22 DIAGNOSIS — I6930 Unspecified sequelae of cerebral infarction: Secondary | ICD-10-CM | POA: Diagnosis not present

## 2021-09-22 DIAGNOSIS — R269 Unspecified abnormalities of gait and mobility: Secondary | ICD-10-CM | POA: Diagnosis not present

## 2021-09-23 DIAGNOSIS — R2689 Other abnormalities of gait and mobility: Secondary | ICD-10-CM | POA: Diagnosis not present

## 2021-09-23 DIAGNOSIS — E785 Hyperlipidemia, unspecified: Secondary | ICD-10-CM | POA: Diagnosis not present

## 2021-09-23 DIAGNOSIS — I10 Essential (primary) hypertension: Secondary | ICD-10-CM | POA: Diagnosis not present

## 2021-09-23 DIAGNOSIS — E039 Hypothyroidism, unspecified: Secondary | ICD-10-CM | POA: Diagnosis not present

## 2021-09-23 DIAGNOSIS — M6259 Muscle wasting and atrophy, not elsewhere classified, multiple sites: Secondary | ICD-10-CM | POA: Diagnosis not present

## 2021-09-23 DIAGNOSIS — E46 Unspecified protein-calorie malnutrition: Secondary | ICD-10-CM | POA: Diagnosis not present

## 2021-09-23 DIAGNOSIS — M6281 Muscle weakness (generalized): Secondary | ICD-10-CM | POA: Diagnosis not present

## 2021-09-23 DIAGNOSIS — J9601 Acute respiratory failure with hypoxia: Secondary | ICD-10-CM | POA: Diagnosis not present

## 2021-09-23 DIAGNOSIS — R1311 Dysphagia, oral phase: Secondary | ICD-10-CM | POA: Diagnosis not present

## 2021-09-23 DIAGNOSIS — I6930 Unspecified sequelae of cerebral infarction: Secondary | ICD-10-CM | POA: Diagnosis not present

## 2021-09-23 DIAGNOSIS — R269 Unspecified abnormalities of gait and mobility: Secondary | ICD-10-CM | POA: Diagnosis not present

## 2021-09-23 DIAGNOSIS — Z950 Presence of cardiac pacemaker: Secondary | ICD-10-CM | POA: Diagnosis not present

## 2021-09-24 DIAGNOSIS — I6930 Unspecified sequelae of cerebral infarction: Secondary | ICD-10-CM | POA: Diagnosis not present

## 2021-09-24 DIAGNOSIS — M6259 Muscle wasting and atrophy, not elsewhere classified, multiple sites: Secondary | ICD-10-CM | POA: Diagnosis not present

## 2021-09-24 DIAGNOSIS — R269 Unspecified abnormalities of gait and mobility: Secondary | ICD-10-CM | POA: Diagnosis not present

## 2021-09-24 DIAGNOSIS — I10 Essential (primary) hypertension: Secondary | ICD-10-CM | POA: Diagnosis not present

## 2021-09-24 DIAGNOSIS — R2689 Other abnormalities of gait and mobility: Secondary | ICD-10-CM | POA: Diagnosis not present

## 2021-09-24 DIAGNOSIS — J9601 Acute respiratory failure with hypoxia: Secondary | ICD-10-CM | POA: Diagnosis not present

## 2021-09-24 DIAGNOSIS — E46 Unspecified protein-calorie malnutrition: Secondary | ICD-10-CM | POA: Diagnosis not present

## 2021-09-24 DIAGNOSIS — E785 Hyperlipidemia, unspecified: Secondary | ICD-10-CM | POA: Diagnosis not present

## 2021-09-24 DIAGNOSIS — R1311 Dysphagia, oral phase: Secondary | ICD-10-CM | POA: Diagnosis not present

## 2021-09-24 DIAGNOSIS — Z950 Presence of cardiac pacemaker: Secondary | ICD-10-CM | POA: Diagnosis not present

## 2021-09-24 DIAGNOSIS — M6281 Muscle weakness (generalized): Secondary | ICD-10-CM | POA: Diagnosis not present

## 2021-09-24 DIAGNOSIS — E039 Hypothyroidism, unspecified: Secondary | ICD-10-CM | POA: Diagnosis not present

## 2021-11-04 DIAGNOSIS — I69311 Memory deficit following cerebral infarction: Secondary | ICD-10-CM | POA: Diagnosis not present

## 2021-11-04 DIAGNOSIS — F01511 Vascular dementia, unspecified severity, with agitation: Secondary | ICD-10-CM | POA: Diagnosis not present

## 2021-11-07 DIAGNOSIS — E785 Hyperlipidemia, unspecified: Secondary | ICD-10-CM | POA: Diagnosis not present

## 2021-11-07 DIAGNOSIS — I119 Hypertensive heart disease without heart failure: Secondary | ICD-10-CM | POA: Diagnosis not present

## 2021-11-07 DIAGNOSIS — F03918 Unspecified dementia, unspecified severity, with other behavioral disturbance: Secondary | ICD-10-CM | POA: Diagnosis not present

## 2021-11-11 DIAGNOSIS — Z950 Presence of cardiac pacemaker: Secondary | ICD-10-CM | POA: Diagnosis not present

## 2021-11-11 DIAGNOSIS — E46 Unspecified protein-calorie malnutrition: Secondary | ICD-10-CM | POA: Diagnosis not present

## 2021-11-11 DIAGNOSIS — R2689 Other abnormalities of gait and mobility: Secondary | ICD-10-CM | POA: Diagnosis not present

## 2021-11-11 DIAGNOSIS — M6281 Muscle weakness (generalized): Secondary | ICD-10-CM | POA: Diagnosis not present

## 2021-11-11 DIAGNOSIS — M6259 Muscle wasting and atrophy, not elsewhere classified, multiple sites: Secondary | ICD-10-CM | POA: Diagnosis not present

## 2021-11-11 DIAGNOSIS — R1311 Dysphagia, oral phase: Secondary | ICD-10-CM | POA: Diagnosis not present

## 2021-11-11 DIAGNOSIS — J9601 Acute respiratory failure with hypoxia: Secondary | ICD-10-CM | POA: Diagnosis not present

## 2021-11-11 DIAGNOSIS — E039 Hypothyroidism, unspecified: Secondary | ICD-10-CM | POA: Diagnosis not present

## 2021-11-11 DIAGNOSIS — I6930 Unspecified sequelae of cerebral infarction: Secondary | ICD-10-CM | POA: Diagnosis not present

## 2021-11-11 DIAGNOSIS — R269 Unspecified abnormalities of gait and mobility: Secondary | ICD-10-CM | POA: Diagnosis not present

## 2021-11-11 DIAGNOSIS — E785 Hyperlipidemia, unspecified: Secondary | ICD-10-CM | POA: Diagnosis not present

## 2021-11-11 DIAGNOSIS — I10 Essential (primary) hypertension: Secondary | ICD-10-CM | POA: Diagnosis not present

## 2021-11-13 DIAGNOSIS — R1311 Dysphagia, oral phase: Secondary | ICD-10-CM | POA: Diagnosis not present

## 2021-11-13 DIAGNOSIS — E039 Hypothyroidism, unspecified: Secondary | ICD-10-CM | POA: Diagnosis not present

## 2021-11-13 DIAGNOSIS — J9601 Acute respiratory failure with hypoxia: Secondary | ICD-10-CM | POA: Diagnosis not present

## 2021-11-13 DIAGNOSIS — R2689 Other abnormalities of gait and mobility: Secondary | ICD-10-CM | POA: Diagnosis not present

## 2021-11-13 DIAGNOSIS — I10 Essential (primary) hypertension: Secondary | ICD-10-CM | POA: Diagnosis not present

## 2021-11-13 DIAGNOSIS — Z950 Presence of cardiac pacemaker: Secondary | ICD-10-CM | POA: Diagnosis not present

## 2021-11-13 DIAGNOSIS — E785 Hyperlipidemia, unspecified: Secondary | ICD-10-CM | POA: Diagnosis not present

## 2021-11-13 DIAGNOSIS — E46 Unspecified protein-calorie malnutrition: Secondary | ICD-10-CM | POA: Diagnosis not present

## 2021-11-13 DIAGNOSIS — I6930 Unspecified sequelae of cerebral infarction: Secondary | ICD-10-CM | POA: Diagnosis not present

## 2021-11-13 DIAGNOSIS — M6259 Muscle wasting and atrophy, not elsewhere classified, multiple sites: Secondary | ICD-10-CM | POA: Diagnosis not present

## 2021-11-13 DIAGNOSIS — R269 Unspecified abnormalities of gait and mobility: Secondary | ICD-10-CM | POA: Diagnosis not present

## 2021-11-13 DIAGNOSIS — M6281 Muscle weakness (generalized): Secondary | ICD-10-CM | POA: Diagnosis not present

## 2021-11-14 DIAGNOSIS — R1311 Dysphagia, oral phase: Secondary | ICD-10-CM | POA: Diagnosis not present

## 2021-11-14 DIAGNOSIS — M6281 Muscle weakness (generalized): Secondary | ICD-10-CM | POA: Diagnosis not present

## 2021-11-14 DIAGNOSIS — I6930 Unspecified sequelae of cerebral infarction: Secondary | ICD-10-CM | POA: Diagnosis not present

## 2021-11-14 DIAGNOSIS — I10 Essential (primary) hypertension: Secondary | ICD-10-CM | POA: Diagnosis not present

## 2021-11-14 DIAGNOSIS — R2689 Other abnormalities of gait and mobility: Secondary | ICD-10-CM | POA: Diagnosis not present

## 2021-11-14 DIAGNOSIS — M6259 Muscle wasting and atrophy, not elsewhere classified, multiple sites: Secondary | ICD-10-CM | POA: Diagnosis not present

## 2021-11-14 DIAGNOSIS — Z950 Presence of cardiac pacemaker: Secondary | ICD-10-CM | POA: Diagnosis not present

## 2021-11-14 DIAGNOSIS — R269 Unspecified abnormalities of gait and mobility: Secondary | ICD-10-CM | POA: Diagnosis not present

## 2021-11-14 DIAGNOSIS — E039 Hypothyroidism, unspecified: Secondary | ICD-10-CM | POA: Diagnosis not present

## 2021-11-14 DIAGNOSIS — E785 Hyperlipidemia, unspecified: Secondary | ICD-10-CM | POA: Diagnosis not present

## 2021-11-14 DIAGNOSIS — J9601 Acute respiratory failure with hypoxia: Secondary | ICD-10-CM | POA: Diagnosis not present

## 2021-11-14 DIAGNOSIS — E46 Unspecified protein-calorie malnutrition: Secondary | ICD-10-CM | POA: Diagnosis not present

## 2021-11-16 DIAGNOSIS — I10 Essential (primary) hypertension: Secondary | ICD-10-CM | POA: Diagnosis not present

## 2021-11-16 DIAGNOSIS — E46 Unspecified protein-calorie malnutrition: Secondary | ICD-10-CM | POA: Diagnosis not present

## 2021-11-16 DIAGNOSIS — R1311 Dysphagia, oral phase: Secondary | ICD-10-CM | POA: Diagnosis not present

## 2021-11-16 DIAGNOSIS — Z950 Presence of cardiac pacemaker: Secondary | ICD-10-CM | POA: Diagnosis not present

## 2021-11-16 DIAGNOSIS — I6930 Unspecified sequelae of cerebral infarction: Secondary | ICD-10-CM | POA: Diagnosis not present

## 2021-11-16 DIAGNOSIS — E785 Hyperlipidemia, unspecified: Secondary | ICD-10-CM | POA: Diagnosis not present

## 2021-11-16 DIAGNOSIS — J9601 Acute respiratory failure with hypoxia: Secondary | ICD-10-CM | POA: Diagnosis not present

## 2021-11-16 DIAGNOSIS — R269 Unspecified abnormalities of gait and mobility: Secondary | ICD-10-CM | POA: Diagnosis not present

## 2021-11-16 DIAGNOSIS — M6259 Muscle wasting and atrophy, not elsewhere classified, multiple sites: Secondary | ICD-10-CM | POA: Diagnosis not present

## 2021-11-16 DIAGNOSIS — M6281 Muscle weakness (generalized): Secondary | ICD-10-CM | POA: Diagnosis not present

## 2021-11-16 DIAGNOSIS — R2689 Other abnormalities of gait and mobility: Secondary | ICD-10-CM | POA: Diagnosis not present

## 2021-11-16 DIAGNOSIS — E039 Hypothyroidism, unspecified: Secondary | ICD-10-CM | POA: Diagnosis not present

## 2021-11-17 DIAGNOSIS — I6930 Unspecified sequelae of cerebral infarction: Secondary | ICD-10-CM | POA: Diagnosis not present

## 2021-11-17 DIAGNOSIS — R2689 Other abnormalities of gait and mobility: Secondary | ICD-10-CM | POA: Diagnosis not present

## 2021-11-17 DIAGNOSIS — R269 Unspecified abnormalities of gait and mobility: Secondary | ICD-10-CM | POA: Diagnosis not present

## 2021-11-17 DIAGNOSIS — E46 Unspecified protein-calorie malnutrition: Secondary | ICD-10-CM | POA: Diagnosis not present

## 2021-11-17 DIAGNOSIS — M6281 Muscle weakness (generalized): Secondary | ICD-10-CM | POA: Diagnosis not present

## 2021-11-17 DIAGNOSIS — E785 Hyperlipidemia, unspecified: Secondary | ICD-10-CM | POA: Diagnosis not present

## 2021-11-17 DIAGNOSIS — J9601 Acute respiratory failure with hypoxia: Secondary | ICD-10-CM | POA: Diagnosis not present

## 2021-11-17 DIAGNOSIS — Z950 Presence of cardiac pacemaker: Secondary | ICD-10-CM | POA: Diagnosis not present

## 2021-11-17 DIAGNOSIS — R1311 Dysphagia, oral phase: Secondary | ICD-10-CM | POA: Diagnosis not present

## 2021-11-17 DIAGNOSIS — I10 Essential (primary) hypertension: Secondary | ICD-10-CM | POA: Diagnosis not present

## 2021-11-17 DIAGNOSIS — M6259 Muscle wasting and atrophy, not elsewhere classified, multiple sites: Secondary | ICD-10-CM | POA: Diagnosis not present

## 2021-11-17 DIAGNOSIS — E039 Hypothyroidism, unspecified: Secondary | ICD-10-CM | POA: Diagnosis not present

## 2021-11-18 DIAGNOSIS — E46 Unspecified protein-calorie malnutrition: Secondary | ICD-10-CM | POA: Diagnosis not present

## 2021-11-18 DIAGNOSIS — I10 Essential (primary) hypertension: Secondary | ICD-10-CM | POA: Diagnosis not present

## 2021-11-18 DIAGNOSIS — E039 Hypothyroidism, unspecified: Secondary | ICD-10-CM | POA: Diagnosis not present

## 2021-11-18 DIAGNOSIS — M6259 Muscle wasting and atrophy, not elsewhere classified, multiple sites: Secondary | ICD-10-CM | POA: Diagnosis not present

## 2021-11-18 DIAGNOSIS — E785 Hyperlipidemia, unspecified: Secondary | ICD-10-CM | POA: Diagnosis not present

## 2021-11-18 DIAGNOSIS — Z950 Presence of cardiac pacemaker: Secondary | ICD-10-CM | POA: Diagnosis not present

## 2021-11-18 DIAGNOSIS — M6281 Muscle weakness (generalized): Secondary | ICD-10-CM | POA: Diagnosis not present

## 2021-11-18 DIAGNOSIS — R2689 Other abnormalities of gait and mobility: Secondary | ICD-10-CM | POA: Diagnosis not present

## 2021-11-18 DIAGNOSIS — R269 Unspecified abnormalities of gait and mobility: Secondary | ICD-10-CM | POA: Diagnosis not present

## 2021-11-18 DIAGNOSIS — J9601 Acute respiratory failure with hypoxia: Secondary | ICD-10-CM | POA: Diagnosis not present

## 2021-11-18 DIAGNOSIS — I6930 Unspecified sequelae of cerebral infarction: Secondary | ICD-10-CM | POA: Diagnosis not present

## 2021-11-18 DIAGNOSIS — R1311 Dysphagia, oral phase: Secondary | ICD-10-CM | POA: Diagnosis not present

## 2021-11-19 DIAGNOSIS — R2689 Other abnormalities of gait and mobility: Secondary | ICD-10-CM | POA: Diagnosis not present

## 2021-11-19 DIAGNOSIS — I6930 Unspecified sequelae of cerebral infarction: Secondary | ICD-10-CM | POA: Diagnosis not present

## 2021-11-19 DIAGNOSIS — M6259 Muscle wasting and atrophy, not elsewhere classified, multiple sites: Secondary | ICD-10-CM | POA: Diagnosis not present

## 2021-11-19 DIAGNOSIS — M6281 Muscle weakness (generalized): Secondary | ICD-10-CM | POA: Diagnosis not present

## 2021-11-19 DIAGNOSIS — E46 Unspecified protein-calorie malnutrition: Secondary | ICD-10-CM | POA: Diagnosis not present

## 2021-11-19 DIAGNOSIS — Z950 Presence of cardiac pacemaker: Secondary | ICD-10-CM | POA: Diagnosis not present

## 2021-11-19 DIAGNOSIS — J9601 Acute respiratory failure with hypoxia: Secondary | ICD-10-CM | POA: Diagnosis not present

## 2021-11-19 DIAGNOSIS — R269 Unspecified abnormalities of gait and mobility: Secondary | ICD-10-CM | POA: Diagnosis not present

## 2021-11-19 DIAGNOSIS — I10 Essential (primary) hypertension: Secondary | ICD-10-CM | POA: Diagnosis not present

## 2021-11-19 DIAGNOSIS — R1311 Dysphagia, oral phase: Secondary | ICD-10-CM | POA: Diagnosis not present

## 2021-11-19 DIAGNOSIS — E039 Hypothyroidism, unspecified: Secondary | ICD-10-CM | POA: Diagnosis not present

## 2021-11-19 DIAGNOSIS — E785 Hyperlipidemia, unspecified: Secondary | ICD-10-CM | POA: Diagnosis not present

## 2021-11-20 DIAGNOSIS — R1311 Dysphagia, oral phase: Secondary | ICD-10-CM | POA: Diagnosis not present

## 2021-11-20 DIAGNOSIS — M6259 Muscle wasting and atrophy, not elsewhere classified, multiple sites: Secondary | ICD-10-CM | POA: Diagnosis not present

## 2021-11-20 DIAGNOSIS — M6281 Muscle weakness (generalized): Secondary | ICD-10-CM | POA: Diagnosis not present

## 2021-11-20 DIAGNOSIS — E039 Hypothyroidism, unspecified: Secondary | ICD-10-CM | POA: Diagnosis not present

## 2021-11-20 DIAGNOSIS — Z950 Presence of cardiac pacemaker: Secondary | ICD-10-CM | POA: Diagnosis not present

## 2021-11-20 DIAGNOSIS — E46 Unspecified protein-calorie malnutrition: Secondary | ICD-10-CM | POA: Diagnosis not present

## 2021-11-20 DIAGNOSIS — E785 Hyperlipidemia, unspecified: Secondary | ICD-10-CM | POA: Diagnosis not present

## 2021-11-20 DIAGNOSIS — I10 Essential (primary) hypertension: Secondary | ICD-10-CM | POA: Diagnosis not present

## 2021-11-20 DIAGNOSIS — R269 Unspecified abnormalities of gait and mobility: Secondary | ICD-10-CM | POA: Diagnosis not present

## 2021-11-20 DIAGNOSIS — I6930 Unspecified sequelae of cerebral infarction: Secondary | ICD-10-CM | POA: Diagnosis not present

## 2021-11-20 DIAGNOSIS — R2689 Other abnormalities of gait and mobility: Secondary | ICD-10-CM | POA: Diagnosis not present

## 2021-11-20 DIAGNOSIS — J9601 Acute respiratory failure with hypoxia: Secondary | ICD-10-CM | POA: Diagnosis not present

## 2021-11-21 DIAGNOSIS — J9601 Acute respiratory failure with hypoxia: Secondary | ICD-10-CM | POA: Diagnosis not present

## 2021-11-21 DIAGNOSIS — E039 Hypothyroidism, unspecified: Secondary | ICD-10-CM | POA: Diagnosis not present

## 2021-11-21 DIAGNOSIS — R2689 Other abnormalities of gait and mobility: Secondary | ICD-10-CM | POA: Diagnosis not present

## 2021-11-21 DIAGNOSIS — R269 Unspecified abnormalities of gait and mobility: Secondary | ICD-10-CM | POA: Diagnosis not present

## 2021-11-21 DIAGNOSIS — M6259 Muscle wasting and atrophy, not elsewhere classified, multiple sites: Secondary | ICD-10-CM | POA: Diagnosis not present

## 2021-11-21 DIAGNOSIS — I6930 Unspecified sequelae of cerebral infarction: Secondary | ICD-10-CM | POA: Diagnosis not present

## 2021-11-21 DIAGNOSIS — E785 Hyperlipidemia, unspecified: Secondary | ICD-10-CM | POA: Diagnosis not present

## 2021-11-21 DIAGNOSIS — I10 Essential (primary) hypertension: Secondary | ICD-10-CM | POA: Diagnosis not present

## 2021-11-21 DIAGNOSIS — Z950 Presence of cardiac pacemaker: Secondary | ICD-10-CM | POA: Diagnosis not present

## 2021-11-21 DIAGNOSIS — R1311 Dysphagia, oral phase: Secondary | ICD-10-CM | POA: Diagnosis not present

## 2021-11-21 DIAGNOSIS — E46 Unspecified protein-calorie malnutrition: Secondary | ICD-10-CM | POA: Diagnosis not present

## 2021-11-21 DIAGNOSIS — M6281 Muscle weakness (generalized): Secondary | ICD-10-CM | POA: Diagnosis not present

## 2021-11-24 DIAGNOSIS — M6259 Muscle wasting and atrophy, not elsewhere classified, multiple sites: Secondary | ICD-10-CM | POA: Diagnosis not present

## 2021-11-24 DIAGNOSIS — E039 Hypothyroidism, unspecified: Secondary | ICD-10-CM | POA: Diagnosis not present

## 2021-11-24 DIAGNOSIS — Z950 Presence of cardiac pacemaker: Secondary | ICD-10-CM | POA: Diagnosis not present

## 2021-11-24 DIAGNOSIS — R269 Unspecified abnormalities of gait and mobility: Secondary | ICD-10-CM | POA: Diagnosis not present

## 2021-11-24 DIAGNOSIS — R1311 Dysphagia, oral phase: Secondary | ICD-10-CM | POA: Diagnosis not present

## 2021-11-24 DIAGNOSIS — E46 Unspecified protein-calorie malnutrition: Secondary | ICD-10-CM | POA: Diagnosis not present

## 2021-11-24 DIAGNOSIS — I6930 Unspecified sequelae of cerebral infarction: Secondary | ICD-10-CM | POA: Diagnosis not present

## 2021-11-24 DIAGNOSIS — J9601 Acute respiratory failure with hypoxia: Secondary | ICD-10-CM | POA: Diagnosis not present

## 2021-11-24 DIAGNOSIS — R2689 Other abnormalities of gait and mobility: Secondary | ICD-10-CM | POA: Diagnosis not present

## 2021-11-24 DIAGNOSIS — I10 Essential (primary) hypertension: Secondary | ICD-10-CM | POA: Diagnosis not present

## 2021-11-24 DIAGNOSIS — M6281 Muscle weakness (generalized): Secondary | ICD-10-CM | POA: Diagnosis not present

## 2021-11-24 DIAGNOSIS — E785 Hyperlipidemia, unspecified: Secondary | ICD-10-CM | POA: Diagnosis not present

## 2021-11-25 DIAGNOSIS — E46 Unspecified protein-calorie malnutrition: Secondary | ICD-10-CM | POA: Diagnosis not present

## 2021-11-25 DIAGNOSIS — M6259 Muscle wasting and atrophy, not elsewhere classified, multiple sites: Secondary | ICD-10-CM | POA: Diagnosis not present

## 2021-11-25 DIAGNOSIS — Z950 Presence of cardiac pacemaker: Secondary | ICD-10-CM | POA: Diagnosis not present

## 2021-11-25 DIAGNOSIS — R2689 Other abnormalities of gait and mobility: Secondary | ICD-10-CM | POA: Diagnosis not present

## 2021-11-25 DIAGNOSIS — J9601 Acute respiratory failure with hypoxia: Secondary | ICD-10-CM | POA: Diagnosis not present

## 2021-11-25 DIAGNOSIS — I10 Essential (primary) hypertension: Secondary | ICD-10-CM | POA: Diagnosis not present

## 2021-11-25 DIAGNOSIS — R1311 Dysphagia, oral phase: Secondary | ICD-10-CM | POA: Diagnosis not present

## 2021-11-25 DIAGNOSIS — M6281 Muscle weakness (generalized): Secondary | ICD-10-CM | POA: Diagnosis not present

## 2021-11-25 DIAGNOSIS — E039 Hypothyroidism, unspecified: Secondary | ICD-10-CM | POA: Diagnosis not present

## 2021-11-25 DIAGNOSIS — E785 Hyperlipidemia, unspecified: Secondary | ICD-10-CM | POA: Diagnosis not present

## 2021-11-25 DIAGNOSIS — I6930 Unspecified sequelae of cerebral infarction: Secondary | ICD-10-CM | POA: Diagnosis not present

## 2021-11-25 DIAGNOSIS — R269 Unspecified abnormalities of gait and mobility: Secondary | ICD-10-CM | POA: Diagnosis not present

## 2021-11-26 DIAGNOSIS — I10 Essential (primary) hypertension: Secondary | ICD-10-CM | POA: Diagnosis not present

## 2021-11-26 DIAGNOSIS — R2689 Other abnormalities of gait and mobility: Secondary | ICD-10-CM | POA: Diagnosis not present

## 2021-11-26 DIAGNOSIS — Z48812 Encounter for surgical aftercare following surgery on the circulatory system: Secondary | ICD-10-CM | POA: Diagnosis not present

## 2021-11-26 DIAGNOSIS — F03918 Unspecified dementia, unspecified severity, with other behavioral disturbance: Secondary | ICD-10-CM | POA: Diagnosis not present

## 2021-11-26 DIAGNOSIS — R1311 Dysphagia, oral phase: Secondary | ICD-10-CM | POA: Diagnosis not present

## 2021-11-26 DIAGNOSIS — E039 Hypothyroidism, unspecified: Secondary | ICD-10-CM | POA: Diagnosis not present

## 2021-11-26 DIAGNOSIS — E46 Unspecified protein-calorie malnutrition: Secondary | ICD-10-CM | POA: Diagnosis not present

## 2021-11-26 DIAGNOSIS — Z950 Presence of cardiac pacemaker: Secondary | ICD-10-CM | POA: Diagnosis not present

## 2021-11-26 DIAGNOSIS — I6930 Unspecified sequelae of cerebral infarction: Secondary | ICD-10-CM | POA: Diagnosis not present

## 2021-11-26 DIAGNOSIS — E785 Hyperlipidemia, unspecified: Secondary | ICD-10-CM | POA: Diagnosis not present

## 2021-11-26 DIAGNOSIS — J9601 Acute respiratory failure with hypoxia: Secondary | ICD-10-CM | POA: Diagnosis not present

## 2021-11-26 DIAGNOSIS — M6281 Muscle weakness (generalized): Secondary | ICD-10-CM | POA: Diagnosis not present

## 2021-11-26 DIAGNOSIS — M6259 Muscle wasting and atrophy, not elsewhere classified, multiple sites: Secondary | ICD-10-CM | POA: Diagnosis not present

## 2021-11-26 DIAGNOSIS — R269 Unspecified abnormalities of gait and mobility: Secondary | ICD-10-CM | POA: Diagnosis not present

## 2021-11-27 DIAGNOSIS — M6259 Muscle wasting and atrophy, not elsewhere classified, multiple sites: Secondary | ICD-10-CM | POA: Diagnosis not present

## 2021-11-27 DIAGNOSIS — Z950 Presence of cardiac pacemaker: Secondary | ICD-10-CM | POA: Diagnosis not present

## 2021-11-27 DIAGNOSIS — R2689 Other abnormalities of gait and mobility: Secondary | ICD-10-CM | POA: Diagnosis not present

## 2021-11-27 DIAGNOSIS — R1311 Dysphagia, oral phase: Secondary | ICD-10-CM | POA: Diagnosis not present

## 2021-11-27 DIAGNOSIS — E039 Hypothyroidism, unspecified: Secondary | ICD-10-CM | POA: Diagnosis not present

## 2021-11-27 DIAGNOSIS — R269 Unspecified abnormalities of gait and mobility: Secondary | ICD-10-CM | POA: Diagnosis not present

## 2021-11-27 DIAGNOSIS — E46 Unspecified protein-calorie malnutrition: Secondary | ICD-10-CM | POA: Diagnosis not present

## 2021-11-27 DIAGNOSIS — Z48812 Encounter for surgical aftercare following surgery on the circulatory system: Secondary | ICD-10-CM | POA: Diagnosis not present

## 2021-11-27 DIAGNOSIS — E785 Hyperlipidemia, unspecified: Secondary | ICD-10-CM | POA: Diagnosis not present

## 2021-11-27 DIAGNOSIS — I10 Essential (primary) hypertension: Secondary | ICD-10-CM | POA: Diagnosis not present

## 2021-11-27 DIAGNOSIS — I6930 Unspecified sequelae of cerebral infarction: Secondary | ICD-10-CM | POA: Diagnosis not present

## 2021-11-27 DIAGNOSIS — J9601 Acute respiratory failure with hypoxia: Secondary | ICD-10-CM | POA: Diagnosis not present

## 2021-11-27 DIAGNOSIS — M6281 Muscle weakness (generalized): Secondary | ICD-10-CM | POA: Diagnosis not present

## 2021-12-09 DIAGNOSIS — F01511 Vascular dementia, unspecified severity, with agitation: Secondary | ICD-10-CM | POA: Diagnosis not present

## 2021-12-09 DIAGNOSIS — I69311 Memory deficit following cerebral infarction: Secondary | ICD-10-CM | POA: Diagnosis not present

## 2021-12-15 DIAGNOSIS — Z950 Presence of cardiac pacemaker: Secondary | ICD-10-CM | POA: Diagnosis not present

## 2021-12-15 DIAGNOSIS — M6259 Muscle wasting and atrophy, not elsewhere classified, multiple sites: Secondary | ICD-10-CM | POA: Diagnosis not present

## 2021-12-15 DIAGNOSIS — I6930 Unspecified sequelae of cerebral infarction: Secondary | ICD-10-CM | POA: Diagnosis not present

## 2021-12-15 DIAGNOSIS — M6281 Muscle weakness (generalized): Secondary | ICD-10-CM | POA: Diagnosis not present

## 2021-12-15 DIAGNOSIS — R269 Unspecified abnormalities of gait and mobility: Secondary | ICD-10-CM | POA: Diagnosis not present

## 2021-12-15 DIAGNOSIS — Z48812 Encounter for surgical aftercare following surgery on the circulatory system: Secondary | ICD-10-CM | POA: Diagnosis not present

## 2021-12-15 DIAGNOSIS — R2689 Other abnormalities of gait and mobility: Secondary | ICD-10-CM | POA: Diagnosis not present

## 2021-12-15 DIAGNOSIS — R1311 Dysphagia, oral phase: Secondary | ICD-10-CM | POA: Diagnosis not present

## 2021-12-15 DIAGNOSIS — E785 Hyperlipidemia, unspecified: Secondary | ICD-10-CM | POA: Diagnosis not present

## 2021-12-15 DIAGNOSIS — E039 Hypothyroidism, unspecified: Secondary | ICD-10-CM | POA: Diagnosis not present

## 2021-12-15 DIAGNOSIS — J9601 Acute respiratory failure with hypoxia: Secondary | ICD-10-CM | POA: Diagnosis not present

## 2021-12-15 DIAGNOSIS — E46 Unspecified protein-calorie malnutrition: Secondary | ICD-10-CM | POA: Diagnosis not present

## 2021-12-15 DIAGNOSIS — I10 Essential (primary) hypertension: Secondary | ICD-10-CM | POA: Diagnosis not present

## 2021-12-16 DIAGNOSIS — M6281 Muscle weakness (generalized): Secondary | ICD-10-CM | POA: Diagnosis not present

## 2021-12-16 DIAGNOSIS — F03918 Unspecified dementia, unspecified severity, with other behavioral disturbance: Secondary | ICD-10-CM | POA: Diagnosis not present

## 2021-12-16 DIAGNOSIS — Z79899 Other long term (current) drug therapy: Secondary | ICD-10-CM | POA: Diagnosis not present

## 2021-12-16 DIAGNOSIS — Z950 Presence of cardiac pacemaker: Secondary | ICD-10-CM | POA: Diagnosis not present

## 2021-12-16 DIAGNOSIS — R269 Unspecified abnormalities of gait and mobility: Secondary | ICD-10-CM | POA: Diagnosis not present

## 2021-12-16 DIAGNOSIS — E039 Hypothyroidism, unspecified: Secondary | ICD-10-CM | POA: Diagnosis not present

## 2021-12-16 DIAGNOSIS — R1311 Dysphagia, oral phase: Secondary | ICD-10-CM | POA: Diagnosis not present

## 2021-12-16 DIAGNOSIS — I10 Essential (primary) hypertension: Secondary | ICD-10-CM | POA: Diagnosis not present

## 2021-12-16 DIAGNOSIS — J9601 Acute respiratory failure with hypoxia: Secondary | ICD-10-CM | POA: Diagnosis not present

## 2021-12-16 DIAGNOSIS — Z48812 Encounter for surgical aftercare following surgery on the circulatory system: Secondary | ICD-10-CM | POA: Diagnosis not present

## 2021-12-16 DIAGNOSIS — R2689 Other abnormalities of gait and mobility: Secondary | ICD-10-CM | POA: Diagnosis not present

## 2021-12-16 DIAGNOSIS — E46 Unspecified protein-calorie malnutrition: Secondary | ICD-10-CM | POA: Diagnosis not present

## 2021-12-16 DIAGNOSIS — E785 Hyperlipidemia, unspecified: Secondary | ICD-10-CM | POA: Diagnosis not present

## 2021-12-16 DIAGNOSIS — I6930 Unspecified sequelae of cerebral infarction: Secondary | ICD-10-CM | POA: Diagnosis not present

## 2021-12-16 DIAGNOSIS — R6889 Other general symptoms and signs: Secondary | ICD-10-CM | POA: Diagnosis not present

## 2021-12-16 DIAGNOSIS — M6259 Muscle wasting and atrophy, not elsewhere classified, multiple sites: Secondary | ICD-10-CM | POA: Diagnosis not present

## 2021-12-16 DIAGNOSIS — I700 Atherosclerosis of aorta: Secondary | ICD-10-CM | POA: Diagnosis not present

## 2021-12-17 DIAGNOSIS — E039 Hypothyroidism, unspecified: Secondary | ICD-10-CM | POA: Diagnosis not present

## 2021-12-17 DIAGNOSIS — E785 Hyperlipidemia, unspecified: Secondary | ICD-10-CM | POA: Diagnosis not present

## 2021-12-17 DIAGNOSIS — I6930 Unspecified sequelae of cerebral infarction: Secondary | ICD-10-CM | POA: Diagnosis not present

## 2021-12-17 DIAGNOSIS — R269 Unspecified abnormalities of gait and mobility: Secondary | ICD-10-CM | POA: Diagnosis not present

## 2021-12-17 DIAGNOSIS — R2689 Other abnormalities of gait and mobility: Secondary | ICD-10-CM | POA: Diagnosis not present

## 2021-12-17 DIAGNOSIS — Z950 Presence of cardiac pacemaker: Secondary | ICD-10-CM | POA: Diagnosis not present

## 2021-12-17 DIAGNOSIS — I10 Essential (primary) hypertension: Secondary | ICD-10-CM | POA: Diagnosis not present

## 2021-12-17 DIAGNOSIS — M6281 Muscle weakness (generalized): Secondary | ICD-10-CM | POA: Diagnosis not present

## 2021-12-17 DIAGNOSIS — Z48812 Encounter for surgical aftercare following surgery on the circulatory system: Secondary | ICD-10-CM | POA: Diagnosis not present

## 2021-12-17 DIAGNOSIS — E46 Unspecified protein-calorie malnutrition: Secondary | ICD-10-CM | POA: Diagnosis not present

## 2021-12-17 DIAGNOSIS — R1311 Dysphagia, oral phase: Secondary | ICD-10-CM | POA: Diagnosis not present

## 2021-12-17 DIAGNOSIS — M6259 Muscle wasting and atrophy, not elsewhere classified, multiple sites: Secondary | ICD-10-CM | POA: Diagnosis not present

## 2021-12-17 DIAGNOSIS — J9601 Acute respiratory failure with hypoxia: Secondary | ICD-10-CM | POA: Diagnosis not present

## 2021-12-18 DIAGNOSIS — M6259 Muscle wasting and atrophy, not elsewhere classified, multiple sites: Secondary | ICD-10-CM | POA: Diagnosis not present

## 2021-12-18 DIAGNOSIS — M6281 Muscle weakness (generalized): Secondary | ICD-10-CM | POA: Diagnosis not present

## 2021-12-18 DIAGNOSIS — R269 Unspecified abnormalities of gait and mobility: Secondary | ICD-10-CM | POA: Diagnosis not present

## 2021-12-18 DIAGNOSIS — E039 Hypothyroidism, unspecified: Secondary | ICD-10-CM | POA: Diagnosis not present

## 2021-12-18 DIAGNOSIS — J9601 Acute respiratory failure with hypoxia: Secondary | ICD-10-CM | POA: Diagnosis not present

## 2021-12-18 DIAGNOSIS — E46 Unspecified protein-calorie malnutrition: Secondary | ICD-10-CM | POA: Diagnosis not present

## 2021-12-18 DIAGNOSIS — I10 Essential (primary) hypertension: Secondary | ICD-10-CM | POA: Diagnosis not present

## 2021-12-18 DIAGNOSIS — R2689 Other abnormalities of gait and mobility: Secondary | ICD-10-CM | POA: Diagnosis not present

## 2021-12-18 DIAGNOSIS — Z48812 Encounter for surgical aftercare following surgery on the circulatory system: Secondary | ICD-10-CM | POA: Diagnosis not present

## 2021-12-18 DIAGNOSIS — E785 Hyperlipidemia, unspecified: Secondary | ICD-10-CM | POA: Diagnosis not present

## 2021-12-18 DIAGNOSIS — I6930 Unspecified sequelae of cerebral infarction: Secondary | ICD-10-CM | POA: Diagnosis not present

## 2021-12-18 DIAGNOSIS — Z950 Presence of cardiac pacemaker: Secondary | ICD-10-CM | POA: Diagnosis not present

## 2021-12-18 DIAGNOSIS — R1311 Dysphagia, oral phase: Secondary | ICD-10-CM | POA: Diagnosis not present

## 2021-12-19 DIAGNOSIS — R269 Unspecified abnormalities of gait and mobility: Secondary | ICD-10-CM | POA: Diagnosis not present

## 2021-12-19 DIAGNOSIS — I10 Essential (primary) hypertension: Secondary | ICD-10-CM | POA: Diagnosis not present

## 2021-12-19 DIAGNOSIS — E039 Hypothyroidism, unspecified: Secondary | ICD-10-CM | POA: Diagnosis not present

## 2021-12-19 DIAGNOSIS — M6281 Muscle weakness (generalized): Secondary | ICD-10-CM | POA: Diagnosis not present

## 2021-12-19 DIAGNOSIS — J9601 Acute respiratory failure with hypoxia: Secondary | ICD-10-CM | POA: Diagnosis not present

## 2021-12-19 DIAGNOSIS — Z48812 Encounter for surgical aftercare following surgery on the circulatory system: Secondary | ICD-10-CM | POA: Diagnosis not present

## 2021-12-19 DIAGNOSIS — I6930 Unspecified sequelae of cerebral infarction: Secondary | ICD-10-CM | POA: Diagnosis not present

## 2021-12-19 DIAGNOSIS — E46 Unspecified protein-calorie malnutrition: Secondary | ICD-10-CM | POA: Diagnosis not present

## 2021-12-19 DIAGNOSIS — R1311 Dysphagia, oral phase: Secondary | ICD-10-CM | POA: Diagnosis not present

## 2021-12-19 DIAGNOSIS — Z950 Presence of cardiac pacemaker: Secondary | ICD-10-CM | POA: Diagnosis not present

## 2021-12-19 DIAGNOSIS — M6259 Muscle wasting and atrophy, not elsewhere classified, multiple sites: Secondary | ICD-10-CM | POA: Diagnosis not present

## 2021-12-19 DIAGNOSIS — E785 Hyperlipidemia, unspecified: Secondary | ICD-10-CM | POA: Diagnosis not present

## 2021-12-19 DIAGNOSIS — R2689 Other abnormalities of gait and mobility: Secondary | ICD-10-CM | POA: Diagnosis not present

## 2021-12-22 DIAGNOSIS — E785 Hyperlipidemia, unspecified: Secondary | ICD-10-CM | POA: Diagnosis not present

## 2021-12-22 DIAGNOSIS — M6259 Muscle wasting and atrophy, not elsewhere classified, multiple sites: Secondary | ICD-10-CM | POA: Diagnosis not present

## 2021-12-22 DIAGNOSIS — Z48812 Encounter for surgical aftercare following surgery on the circulatory system: Secondary | ICD-10-CM | POA: Diagnosis not present

## 2021-12-22 DIAGNOSIS — I6930 Unspecified sequelae of cerebral infarction: Secondary | ICD-10-CM | POA: Diagnosis not present

## 2021-12-22 DIAGNOSIS — R269 Unspecified abnormalities of gait and mobility: Secondary | ICD-10-CM | POA: Diagnosis not present

## 2021-12-22 DIAGNOSIS — E039 Hypothyroidism, unspecified: Secondary | ICD-10-CM | POA: Diagnosis not present

## 2021-12-22 DIAGNOSIS — M6281 Muscle weakness (generalized): Secondary | ICD-10-CM | POA: Diagnosis not present

## 2021-12-22 DIAGNOSIS — R2689 Other abnormalities of gait and mobility: Secondary | ICD-10-CM | POA: Diagnosis not present

## 2021-12-22 DIAGNOSIS — Z950 Presence of cardiac pacemaker: Secondary | ICD-10-CM | POA: Diagnosis not present

## 2021-12-22 DIAGNOSIS — I10 Essential (primary) hypertension: Secondary | ICD-10-CM | POA: Diagnosis not present

## 2021-12-22 DIAGNOSIS — R1311 Dysphagia, oral phase: Secondary | ICD-10-CM | POA: Diagnosis not present

## 2021-12-22 DIAGNOSIS — E46 Unspecified protein-calorie malnutrition: Secondary | ICD-10-CM | POA: Diagnosis not present

## 2021-12-22 DIAGNOSIS — J9601 Acute respiratory failure with hypoxia: Secondary | ICD-10-CM | POA: Diagnosis not present

## 2021-12-23 DIAGNOSIS — E785 Hyperlipidemia, unspecified: Secondary | ICD-10-CM | POA: Diagnosis not present

## 2021-12-23 DIAGNOSIS — R2689 Other abnormalities of gait and mobility: Secondary | ICD-10-CM | POA: Diagnosis not present

## 2021-12-23 DIAGNOSIS — R1311 Dysphagia, oral phase: Secondary | ICD-10-CM | POA: Diagnosis not present

## 2021-12-23 DIAGNOSIS — E46 Unspecified protein-calorie malnutrition: Secondary | ICD-10-CM | POA: Diagnosis not present

## 2021-12-23 DIAGNOSIS — R269 Unspecified abnormalities of gait and mobility: Secondary | ICD-10-CM | POA: Diagnosis not present

## 2021-12-23 DIAGNOSIS — J9601 Acute respiratory failure with hypoxia: Secondary | ICD-10-CM | POA: Diagnosis not present

## 2021-12-23 DIAGNOSIS — M6259 Muscle wasting and atrophy, not elsewhere classified, multiple sites: Secondary | ICD-10-CM | POA: Diagnosis not present

## 2021-12-23 DIAGNOSIS — I10 Essential (primary) hypertension: Secondary | ICD-10-CM | POA: Diagnosis not present

## 2021-12-23 DIAGNOSIS — E039 Hypothyroidism, unspecified: Secondary | ICD-10-CM | POA: Diagnosis not present

## 2021-12-23 DIAGNOSIS — I6930 Unspecified sequelae of cerebral infarction: Secondary | ICD-10-CM | POA: Diagnosis not present

## 2021-12-23 DIAGNOSIS — Z48812 Encounter for surgical aftercare following surgery on the circulatory system: Secondary | ICD-10-CM | POA: Diagnosis not present

## 2021-12-23 DIAGNOSIS — M6281 Muscle weakness (generalized): Secondary | ICD-10-CM | POA: Diagnosis not present

## 2021-12-23 DIAGNOSIS — Z950 Presence of cardiac pacemaker: Secondary | ICD-10-CM | POA: Diagnosis not present

## 2021-12-24 DIAGNOSIS — Z950 Presence of cardiac pacemaker: Secondary | ICD-10-CM | POA: Diagnosis not present

## 2021-12-24 DIAGNOSIS — I10 Essential (primary) hypertension: Secondary | ICD-10-CM | POA: Diagnosis not present

## 2021-12-24 DIAGNOSIS — M6281 Muscle weakness (generalized): Secondary | ICD-10-CM | POA: Diagnosis not present

## 2021-12-24 DIAGNOSIS — E46 Unspecified protein-calorie malnutrition: Secondary | ICD-10-CM | POA: Diagnosis not present

## 2021-12-24 DIAGNOSIS — I6930 Unspecified sequelae of cerebral infarction: Secondary | ICD-10-CM | POA: Diagnosis not present

## 2021-12-24 DIAGNOSIS — M6259 Muscle wasting and atrophy, not elsewhere classified, multiple sites: Secondary | ICD-10-CM | POA: Diagnosis not present

## 2021-12-24 DIAGNOSIS — E785 Hyperlipidemia, unspecified: Secondary | ICD-10-CM | POA: Diagnosis not present

## 2021-12-24 DIAGNOSIS — R2689 Other abnormalities of gait and mobility: Secondary | ICD-10-CM | POA: Diagnosis not present

## 2021-12-24 DIAGNOSIS — E039 Hypothyroidism, unspecified: Secondary | ICD-10-CM | POA: Diagnosis not present

## 2021-12-24 DIAGNOSIS — R1311 Dysphagia, oral phase: Secondary | ICD-10-CM | POA: Diagnosis not present

## 2021-12-24 DIAGNOSIS — J9601 Acute respiratory failure with hypoxia: Secondary | ICD-10-CM | POA: Diagnosis not present

## 2021-12-24 DIAGNOSIS — R269 Unspecified abnormalities of gait and mobility: Secondary | ICD-10-CM | POA: Diagnosis not present

## 2021-12-26 DIAGNOSIS — R269 Unspecified abnormalities of gait and mobility: Secondary | ICD-10-CM | POA: Diagnosis not present

## 2021-12-26 DIAGNOSIS — E46 Unspecified protein-calorie malnutrition: Secondary | ICD-10-CM | POA: Diagnosis not present

## 2021-12-26 DIAGNOSIS — M6259 Muscle wasting and atrophy, not elsewhere classified, multiple sites: Secondary | ICD-10-CM | POA: Diagnosis not present

## 2021-12-26 DIAGNOSIS — I6930 Unspecified sequelae of cerebral infarction: Secondary | ICD-10-CM | POA: Diagnosis not present

## 2021-12-26 DIAGNOSIS — E785 Hyperlipidemia, unspecified: Secondary | ICD-10-CM | POA: Diagnosis not present

## 2021-12-26 DIAGNOSIS — R2689 Other abnormalities of gait and mobility: Secondary | ICD-10-CM | POA: Diagnosis not present

## 2021-12-26 DIAGNOSIS — I10 Essential (primary) hypertension: Secondary | ICD-10-CM | POA: Diagnosis not present

## 2021-12-26 DIAGNOSIS — M6281 Muscle weakness (generalized): Secondary | ICD-10-CM | POA: Diagnosis not present

## 2021-12-26 DIAGNOSIS — E039 Hypothyroidism, unspecified: Secondary | ICD-10-CM | POA: Diagnosis not present

## 2021-12-26 DIAGNOSIS — R1311 Dysphagia, oral phase: Secondary | ICD-10-CM | POA: Diagnosis not present

## 2021-12-26 DIAGNOSIS — Z950 Presence of cardiac pacemaker: Secondary | ICD-10-CM | POA: Diagnosis not present

## 2021-12-26 DIAGNOSIS — J9601 Acute respiratory failure with hypoxia: Secondary | ICD-10-CM | POA: Diagnosis not present

## 2021-12-29 DIAGNOSIS — R2689 Other abnormalities of gait and mobility: Secondary | ICD-10-CM | POA: Diagnosis not present

## 2021-12-29 DIAGNOSIS — I10 Essential (primary) hypertension: Secondary | ICD-10-CM | POA: Diagnosis not present

## 2021-12-29 DIAGNOSIS — Z950 Presence of cardiac pacemaker: Secondary | ICD-10-CM | POA: Diagnosis not present

## 2021-12-29 DIAGNOSIS — M6281 Muscle weakness (generalized): Secondary | ICD-10-CM | POA: Diagnosis not present

## 2021-12-29 DIAGNOSIS — M6259 Muscle wasting and atrophy, not elsewhere classified, multiple sites: Secondary | ICD-10-CM | POA: Diagnosis not present

## 2021-12-29 DIAGNOSIS — E785 Hyperlipidemia, unspecified: Secondary | ICD-10-CM | POA: Diagnosis not present

## 2021-12-29 DIAGNOSIS — J9601 Acute respiratory failure with hypoxia: Secondary | ICD-10-CM | POA: Diagnosis not present

## 2021-12-29 DIAGNOSIS — R269 Unspecified abnormalities of gait and mobility: Secondary | ICD-10-CM | POA: Diagnosis not present

## 2021-12-29 DIAGNOSIS — E46 Unspecified protein-calorie malnutrition: Secondary | ICD-10-CM | POA: Diagnosis not present

## 2021-12-29 DIAGNOSIS — R1311 Dysphagia, oral phase: Secondary | ICD-10-CM | POA: Diagnosis not present

## 2021-12-29 DIAGNOSIS — E039 Hypothyroidism, unspecified: Secondary | ICD-10-CM | POA: Diagnosis not present

## 2021-12-29 DIAGNOSIS — I6930 Unspecified sequelae of cerebral infarction: Secondary | ICD-10-CM | POA: Diagnosis not present

## 2022-01-13 DIAGNOSIS — I69311 Memory deficit following cerebral infarction: Secondary | ICD-10-CM | POA: Diagnosis not present

## 2022-01-13 DIAGNOSIS — E039 Hypothyroidism, unspecified: Secondary | ICD-10-CM | POA: Diagnosis not present

## 2022-01-13 DIAGNOSIS — F03918 Unspecified dementia, unspecified severity, with other behavioral disturbance: Secondary | ICD-10-CM | POA: Diagnosis not present

## 2022-01-13 DIAGNOSIS — I700 Atherosclerosis of aorta: Secondary | ICD-10-CM | POA: Diagnosis not present

## 2022-01-13 DIAGNOSIS — F01511 Vascular dementia, unspecified severity, with agitation: Secondary | ICD-10-CM | POA: Diagnosis not present

## 2022-01-13 DIAGNOSIS — I119 Hypertensive heart disease without heart failure: Secondary | ICD-10-CM | POA: Diagnosis not present

## 2022-02-03 DIAGNOSIS — F01511 Vascular dementia, unspecified severity, with agitation: Secondary | ICD-10-CM | POA: Diagnosis not present

## 2022-02-03 DIAGNOSIS — R451 Restlessness and agitation: Secondary | ICD-10-CM | POA: Diagnosis not present

## 2022-02-03 DIAGNOSIS — I69311 Memory deficit following cerebral infarction: Secondary | ICD-10-CM | POA: Diagnosis not present

## 2022-02-11 DIAGNOSIS — Z5181 Encounter for therapeutic drug level monitoring: Secondary | ICD-10-CM | POA: Diagnosis not present

## 2022-02-11 DIAGNOSIS — Z79899 Other long term (current) drug therapy: Secondary | ICD-10-CM | POA: Diagnosis not present

## 2022-02-16 DIAGNOSIS — M79671 Pain in right foot: Secondary | ICD-10-CM | POA: Diagnosis not present

## 2022-02-16 DIAGNOSIS — E785 Hyperlipidemia, unspecified: Secondary | ICD-10-CM | POA: Diagnosis not present

## 2022-02-16 DIAGNOSIS — E039 Hypothyroidism, unspecified: Secondary | ICD-10-CM | POA: Diagnosis not present

## 2022-02-16 DIAGNOSIS — M19071 Primary osteoarthritis, right ankle and foot: Secondary | ICD-10-CM | POA: Diagnosis not present

## 2022-02-16 DIAGNOSIS — F03918 Unspecified dementia, unspecified severity, with other behavioral disturbance: Secondary | ICD-10-CM | POA: Diagnosis not present

## 2022-02-16 DIAGNOSIS — Z8673 Personal history of transient ischemic attack (TIA), and cerebral infarction without residual deficits: Secondary | ICD-10-CM | POA: Diagnosis not present

## 2022-02-17 DIAGNOSIS — M109 Gout, unspecified: Secondary | ICD-10-CM | POA: Diagnosis not present

## 2022-02-24 DIAGNOSIS — I69311 Memory deficit following cerebral infarction: Secondary | ICD-10-CM | POA: Diagnosis not present

## 2022-02-24 DIAGNOSIS — F01511 Vascular dementia, unspecified severity, with agitation: Secondary | ICD-10-CM | POA: Diagnosis not present

## 2022-02-26 DIAGNOSIS — M1990 Unspecified osteoarthritis, unspecified site: Secondary | ICD-10-CM | POA: Diagnosis not present

## 2022-03-05 DIAGNOSIS — Z4509 Encounter for adjustment and management of other cardiac device: Secondary | ICD-10-CM | POA: Diagnosis not present

## 2022-03-05 DIAGNOSIS — R55 Syncope and collapse: Secondary | ICD-10-CM | POA: Diagnosis not present

## 2022-03-10 DIAGNOSIS — Z950 Presence of cardiac pacemaker: Secondary | ICD-10-CM | POA: Diagnosis not present

## 2022-03-10 DIAGNOSIS — E785 Hyperlipidemia, unspecified: Secondary | ICD-10-CM | POA: Diagnosis not present

## 2022-03-10 DIAGNOSIS — M6259 Muscle wasting and atrophy, not elsewhere classified, multiple sites: Secondary | ICD-10-CM | POA: Diagnosis not present

## 2022-03-10 DIAGNOSIS — E46 Unspecified protein-calorie malnutrition: Secondary | ICD-10-CM | POA: Diagnosis not present

## 2022-03-10 DIAGNOSIS — I6930 Unspecified sequelae of cerebral infarction: Secondary | ICD-10-CM | POA: Diagnosis not present

## 2022-03-10 DIAGNOSIS — J9601 Acute respiratory failure with hypoxia: Secondary | ICD-10-CM | POA: Diagnosis not present

## 2022-03-10 DIAGNOSIS — R1311 Dysphagia, oral phase: Secondary | ICD-10-CM | POA: Diagnosis not present

## 2022-03-10 DIAGNOSIS — I10 Essential (primary) hypertension: Secondary | ICD-10-CM | POA: Diagnosis not present

## 2022-03-10 DIAGNOSIS — E039 Hypothyroidism, unspecified: Secondary | ICD-10-CM | POA: Diagnosis not present

## 2022-03-10 DIAGNOSIS — R269 Unspecified abnormalities of gait and mobility: Secondary | ICD-10-CM | POA: Diagnosis not present

## 2022-03-10 DIAGNOSIS — R2689 Other abnormalities of gait and mobility: Secondary | ICD-10-CM | POA: Diagnosis not present

## 2022-03-10 DIAGNOSIS — M6281 Muscle weakness (generalized): Secondary | ICD-10-CM | POA: Diagnosis not present

## 2022-03-11 DIAGNOSIS — J9601 Acute respiratory failure with hypoxia: Secondary | ICD-10-CM | POA: Diagnosis not present

## 2022-03-11 DIAGNOSIS — E785 Hyperlipidemia, unspecified: Secondary | ICD-10-CM | POA: Diagnosis not present

## 2022-03-11 DIAGNOSIS — I10 Essential (primary) hypertension: Secondary | ICD-10-CM | POA: Diagnosis not present

## 2022-03-11 DIAGNOSIS — M6259 Muscle wasting and atrophy, not elsewhere classified, multiple sites: Secondary | ICD-10-CM | POA: Diagnosis not present

## 2022-03-11 DIAGNOSIS — Z950 Presence of cardiac pacemaker: Secondary | ICD-10-CM | POA: Diagnosis not present

## 2022-03-11 DIAGNOSIS — R1311 Dysphagia, oral phase: Secondary | ICD-10-CM | POA: Diagnosis not present

## 2022-03-11 DIAGNOSIS — E46 Unspecified protein-calorie malnutrition: Secondary | ICD-10-CM | POA: Diagnosis not present

## 2022-03-11 DIAGNOSIS — E039 Hypothyroidism, unspecified: Secondary | ICD-10-CM | POA: Diagnosis not present

## 2022-03-11 DIAGNOSIS — I6930 Unspecified sequelae of cerebral infarction: Secondary | ICD-10-CM | POA: Diagnosis not present

## 2022-03-11 DIAGNOSIS — R2689 Other abnormalities of gait and mobility: Secondary | ICD-10-CM | POA: Diagnosis not present

## 2022-03-11 DIAGNOSIS — R269 Unspecified abnormalities of gait and mobility: Secondary | ICD-10-CM | POA: Diagnosis not present

## 2022-03-11 DIAGNOSIS — M6281 Muscle weakness (generalized): Secondary | ICD-10-CM | POA: Diagnosis not present

## 2022-03-12 DIAGNOSIS — Z950 Presence of cardiac pacemaker: Secondary | ICD-10-CM | POA: Diagnosis not present

## 2022-03-12 DIAGNOSIS — I6930 Unspecified sequelae of cerebral infarction: Secondary | ICD-10-CM | POA: Diagnosis not present

## 2022-03-12 DIAGNOSIS — M6259 Muscle wasting and atrophy, not elsewhere classified, multiple sites: Secondary | ICD-10-CM | POA: Diagnosis not present

## 2022-03-12 DIAGNOSIS — R269 Unspecified abnormalities of gait and mobility: Secondary | ICD-10-CM | POA: Diagnosis not present

## 2022-03-12 DIAGNOSIS — J9601 Acute respiratory failure with hypoxia: Secondary | ICD-10-CM | POA: Diagnosis not present

## 2022-03-12 DIAGNOSIS — E785 Hyperlipidemia, unspecified: Secondary | ICD-10-CM | POA: Diagnosis not present

## 2022-03-12 DIAGNOSIS — R1311 Dysphagia, oral phase: Secondary | ICD-10-CM | POA: Diagnosis not present

## 2022-03-12 DIAGNOSIS — M6281 Muscle weakness (generalized): Secondary | ICD-10-CM | POA: Diagnosis not present

## 2022-03-12 DIAGNOSIS — E039 Hypothyroidism, unspecified: Secondary | ICD-10-CM | POA: Diagnosis not present

## 2022-03-12 DIAGNOSIS — I10 Essential (primary) hypertension: Secondary | ICD-10-CM | POA: Diagnosis not present

## 2022-03-12 DIAGNOSIS — R2689 Other abnormalities of gait and mobility: Secondary | ICD-10-CM | POA: Diagnosis not present

## 2022-03-12 DIAGNOSIS — E46 Unspecified protein-calorie malnutrition: Secondary | ICD-10-CM | POA: Diagnosis not present

## 2022-03-13 DIAGNOSIS — E46 Unspecified protein-calorie malnutrition: Secondary | ICD-10-CM | POA: Diagnosis not present

## 2022-03-13 DIAGNOSIS — M6259 Muscle wasting and atrophy, not elsewhere classified, multiple sites: Secondary | ICD-10-CM | POA: Diagnosis not present

## 2022-03-13 DIAGNOSIS — R2689 Other abnormalities of gait and mobility: Secondary | ICD-10-CM | POA: Diagnosis not present

## 2022-03-13 DIAGNOSIS — J9601 Acute respiratory failure with hypoxia: Secondary | ICD-10-CM | POA: Diagnosis not present

## 2022-03-13 DIAGNOSIS — E785 Hyperlipidemia, unspecified: Secondary | ICD-10-CM | POA: Diagnosis not present

## 2022-03-13 DIAGNOSIS — Z950 Presence of cardiac pacemaker: Secondary | ICD-10-CM | POA: Diagnosis not present

## 2022-03-13 DIAGNOSIS — M6281 Muscle weakness (generalized): Secondary | ICD-10-CM | POA: Diagnosis not present

## 2022-03-13 DIAGNOSIS — R269 Unspecified abnormalities of gait and mobility: Secondary | ICD-10-CM | POA: Diagnosis not present

## 2022-03-13 DIAGNOSIS — I6930 Unspecified sequelae of cerebral infarction: Secondary | ICD-10-CM | POA: Diagnosis not present

## 2022-03-13 DIAGNOSIS — R1311 Dysphagia, oral phase: Secondary | ICD-10-CM | POA: Diagnosis not present

## 2022-03-13 DIAGNOSIS — I10 Essential (primary) hypertension: Secondary | ICD-10-CM | POA: Diagnosis not present

## 2022-03-13 DIAGNOSIS — E039 Hypothyroidism, unspecified: Secondary | ICD-10-CM | POA: Diagnosis not present

## 2022-03-16 DIAGNOSIS — I10 Essential (primary) hypertension: Secondary | ICD-10-CM | POA: Diagnosis not present

## 2022-03-16 DIAGNOSIS — I6930 Unspecified sequelae of cerebral infarction: Secondary | ICD-10-CM | POA: Diagnosis not present

## 2022-03-16 DIAGNOSIS — E785 Hyperlipidemia, unspecified: Secondary | ICD-10-CM | POA: Diagnosis not present

## 2022-03-16 DIAGNOSIS — R269 Unspecified abnormalities of gait and mobility: Secondary | ICD-10-CM | POA: Diagnosis not present

## 2022-03-16 DIAGNOSIS — M6259 Muscle wasting and atrophy, not elsewhere classified, multiple sites: Secondary | ICD-10-CM | POA: Diagnosis not present

## 2022-03-16 DIAGNOSIS — M6281 Muscle weakness (generalized): Secondary | ICD-10-CM | POA: Diagnosis not present

## 2022-03-16 DIAGNOSIS — Z950 Presence of cardiac pacemaker: Secondary | ICD-10-CM | POA: Diagnosis not present

## 2022-03-16 DIAGNOSIS — J9601 Acute respiratory failure with hypoxia: Secondary | ICD-10-CM | POA: Diagnosis not present

## 2022-03-16 DIAGNOSIS — R1311 Dysphagia, oral phase: Secondary | ICD-10-CM | POA: Diagnosis not present

## 2022-03-16 DIAGNOSIS — E039 Hypothyroidism, unspecified: Secondary | ICD-10-CM | POA: Diagnosis not present

## 2022-03-16 DIAGNOSIS — R2689 Other abnormalities of gait and mobility: Secondary | ICD-10-CM | POA: Diagnosis not present

## 2022-03-16 DIAGNOSIS — E46 Unspecified protein-calorie malnutrition: Secondary | ICD-10-CM | POA: Diagnosis not present

## 2022-03-17 DIAGNOSIS — Z79899 Other long term (current) drug therapy: Secondary | ICD-10-CM | POA: Diagnosis not present

## 2022-03-18 DIAGNOSIS — E785 Hyperlipidemia, unspecified: Secondary | ICD-10-CM | POA: Diagnosis not present

## 2022-03-18 DIAGNOSIS — J9601 Acute respiratory failure with hypoxia: Secondary | ICD-10-CM | POA: Diagnosis not present

## 2022-03-18 DIAGNOSIS — M6259 Muscle wasting and atrophy, not elsewhere classified, multiple sites: Secondary | ICD-10-CM | POA: Diagnosis not present

## 2022-03-18 DIAGNOSIS — M6281 Muscle weakness (generalized): Secondary | ICD-10-CM | POA: Diagnosis not present

## 2022-03-18 DIAGNOSIS — R1311 Dysphagia, oral phase: Secondary | ICD-10-CM | POA: Diagnosis not present

## 2022-03-18 DIAGNOSIS — I10 Essential (primary) hypertension: Secondary | ICD-10-CM | POA: Diagnosis not present

## 2022-03-18 DIAGNOSIS — R269 Unspecified abnormalities of gait and mobility: Secondary | ICD-10-CM | POA: Diagnosis not present

## 2022-03-18 DIAGNOSIS — I6930 Unspecified sequelae of cerebral infarction: Secondary | ICD-10-CM | POA: Diagnosis not present

## 2022-03-18 DIAGNOSIS — E46 Unspecified protein-calorie malnutrition: Secondary | ICD-10-CM | POA: Diagnosis not present

## 2022-03-18 DIAGNOSIS — Z950 Presence of cardiac pacemaker: Secondary | ICD-10-CM | POA: Diagnosis not present

## 2022-03-18 DIAGNOSIS — R2689 Other abnormalities of gait and mobility: Secondary | ICD-10-CM | POA: Diagnosis not present

## 2022-03-18 DIAGNOSIS — E039 Hypothyroidism, unspecified: Secondary | ICD-10-CM | POA: Diagnosis not present

## 2022-03-19 DIAGNOSIS — E785 Hyperlipidemia, unspecified: Secondary | ICD-10-CM | POA: Diagnosis not present

## 2022-03-19 DIAGNOSIS — R2689 Other abnormalities of gait and mobility: Secondary | ICD-10-CM | POA: Diagnosis not present

## 2022-03-19 DIAGNOSIS — I6930 Unspecified sequelae of cerebral infarction: Secondary | ICD-10-CM | POA: Diagnosis not present

## 2022-03-19 DIAGNOSIS — M6281 Muscle weakness (generalized): Secondary | ICD-10-CM | POA: Diagnosis not present

## 2022-03-19 DIAGNOSIS — J9601 Acute respiratory failure with hypoxia: Secondary | ICD-10-CM | POA: Diagnosis not present

## 2022-03-19 DIAGNOSIS — Z950 Presence of cardiac pacemaker: Secondary | ICD-10-CM | POA: Diagnosis not present

## 2022-03-19 DIAGNOSIS — R1311 Dysphagia, oral phase: Secondary | ICD-10-CM | POA: Diagnosis not present

## 2022-03-19 DIAGNOSIS — K449 Diaphragmatic hernia without obstruction or gangrene: Secondary | ICD-10-CM | POA: Diagnosis not present

## 2022-03-19 DIAGNOSIS — E039 Hypothyroidism, unspecified: Secondary | ICD-10-CM | POA: Diagnosis not present

## 2022-03-19 DIAGNOSIS — I119 Hypertensive heart disease without heart failure: Secondary | ICD-10-CM | POA: Diagnosis not present

## 2022-03-19 DIAGNOSIS — F03918 Unspecified dementia, unspecified severity, with other behavioral disturbance: Secondary | ICD-10-CM | POA: Diagnosis not present

## 2022-03-19 DIAGNOSIS — E46 Unspecified protein-calorie malnutrition: Secondary | ICD-10-CM | POA: Diagnosis not present

## 2022-03-19 DIAGNOSIS — I10 Essential (primary) hypertension: Secondary | ICD-10-CM | POA: Diagnosis not present

## 2022-03-19 DIAGNOSIS — M6259 Muscle wasting and atrophy, not elsewhere classified, multiple sites: Secondary | ICD-10-CM | POA: Diagnosis not present

## 2022-03-19 DIAGNOSIS — R269 Unspecified abnormalities of gait and mobility: Secondary | ICD-10-CM | POA: Diagnosis not present

## 2022-03-20 DIAGNOSIS — E039 Hypothyroidism, unspecified: Secondary | ICD-10-CM | POA: Diagnosis not present

## 2022-03-20 DIAGNOSIS — Z950 Presence of cardiac pacemaker: Secondary | ICD-10-CM | POA: Diagnosis not present

## 2022-03-20 DIAGNOSIS — E46 Unspecified protein-calorie malnutrition: Secondary | ICD-10-CM | POA: Diagnosis not present

## 2022-03-20 DIAGNOSIS — J9601 Acute respiratory failure with hypoxia: Secondary | ICD-10-CM | POA: Diagnosis not present

## 2022-03-20 DIAGNOSIS — R269 Unspecified abnormalities of gait and mobility: Secondary | ICD-10-CM | POA: Diagnosis not present

## 2022-03-20 DIAGNOSIS — I10 Essential (primary) hypertension: Secondary | ICD-10-CM | POA: Diagnosis not present

## 2022-03-20 DIAGNOSIS — E785 Hyperlipidemia, unspecified: Secondary | ICD-10-CM | POA: Diagnosis not present

## 2022-03-20 DIAGNOSIS — M6259 Muscle wasting and atrophy, not elsewhere classified, multiple sites: Secondary | ICD-10-CM | POA: Diagnosis not present

## 2022-03-20 DIAGNOSIS — R2689 Other abnormalities of gait and mobility: Secondary | ICD-10-CM | POA: Diagnosis not present

## 2022-03-20 DIAGNOSIS — R1311 Dysphagia, oral phase: Secondary | ICD-10-CM | POA: Diagnosis not present

## 2022-03-20 DIAGNOSIS — I6930 Unspecified sequelae of cerebral infarction: Secondary | ICD-10-CM | POA: Diagnosis not present

## 2022-03-20 DIAGNOSIS — M6281 Muscle weakness (generalized): Secondary | ICD-10-CM | POA: Diagnosis not present

## 2022-03-22 DIAGNOSIS — E46 Unspecified protein-calorie malnutrition: Secondary | ICD-10-CM | POA: Diagnosis not present

## 2022-03-22 DIAGNOSIS — M6259 Muscle wasting and atrophy, not elsewhere classified, multiple sites: Secondary | ICD-10-CM | POA: Diagnosis not present

## 2022-03-22 DIAGNOSIS — I6930 Unspecified sequelae of cerebral infarction: Secondary | ICD-10-CM | POA: Diagnosis not present

## 2022-03-22 DIAGNOSIS — M6281 Muscle weakness (generalized): Secondary | ICD-10-CM | POA: Diagnosis not present

## 2022-03-22 DIAGNOSIS — R2689 Other abnormalities of gait and mobility: Secondary | ICD-10-CM | POA: Diagnosis not present

## 2022-03-22 DIAGNOSIS — R1311 Dysphagia, oral phase: Secondary | ICD-10-CM | POA: Diagnosis not present

## 2022-03-22 DIAGNOSIS — E785 Hyperlipidemia, unspecified: Secondary | ICD-10-CM | POA: Diagnosis not present

## 2022-03-22 DIAGNOSIS — I10 Essential (primary) hypertension: Secondary | ICD-10-CM | POA: Diagnosis not present

## 2022-03-22 DIAGNOSIS — R269 Unspecified abnormalities of gait and mobility: Secondary | ICD-10-CM | POA: Diagnosis not present

## 2022-03-22 DIAGNOSIS — Z950 Presence of cardiac pacemaker: Secondary | ICD-10-CM | POA: Diagnosis not present

## 2022-03-22 DIAGNOSIS — E039 Hypothyroidism, unspecified: Secondary | ICD-10-CM | POA: Diagnosis not present

## 2022-03-22 DIAGNOSIS — J9601 Acute respiratory failure with hypoxia: Secondary | ICD-10-CM | POA: Diagnosis not present

## 2022-03-23 DIAGNOSIS — E46 Unspecified protein-calorie malnutrition: Secondary | ICD-10-CM | POA: Diagnosis not present

## 2022-03-23 DIAGNOSIS — I6930 Unspecified sequelae of cerebral infarction: Secondary | ICD-10-CM | POA: Diagnosis not present

## 2022-03-23 DIAGNOSIS — Z950 Presence of cardiac pacemaker: Secondary | ICD-10-CM | POA: Diagnosis not present

## 2022-03-23 DIAGNOSIS — M6281 Muscle weakness (generalized): Secondary | ICD-10-CM | POA: Diagnosis not present

## 2022-03-23 DIAGNOSIS — I10 Essential (primary) hypertension: Secondary | ICD-10-CM | POA: Diagnosis not present

## 2022-03-23 DIAGNOSIS — R1311 Dysphagia, oral phase: Secondary | ICD-10-CM | POA: Diagnosis not present

## 2022-03-23 DIAGNOSIS — M6259 Muscle wasting and atrophy, not elsewhere classified, multiple sites: Secondary | ICD-10-CM | POA: Diagnosis not present

## 2022-03-23 DIAGNOSIS — R2689 Other abnormalities of gait and mobility: Secondary | ICD-10-CM | POA: Diagnosis not present

## 2022-03-23 DIAGNOSIS — R269 Unspecified abnormalities of gait and mobility: Secondary | ICD-10-CM | POA: Diagnosis not present

## 2022-03-23 DIAGNOSIS — E039 Hypothyroidism, unspecified: Secondary | ICD-10-CM | POA: Diagnosis not present

## 2022-03-23 DIAGNOSIS — J9601 Acute respiratory failure with hypoxia: Secondary | ICD-10-CM | POA: Diagnosis not present

## 2022-03-23 DIAGNOSIS — E785 Hyperlipidemia, unspecified: Secondary | ICD-10-CM | POA: Diagnosis not present

## 2022-03-24 DIAGNOSIS — R269 Unspecified abnormalities of gait and mobility: Secondary | ICD-10-CM | POA: Diagnosis not present

## 2022-03-24 DIAGNOSIS — E039 Hypothyroidism, unspecified: Secondary | ICD-10-CM | POA: Diagnosis not present

## 2022-03-24 DIAGNOSIS — R1311 Dysphagia, oral phase: Secondary | ICD-10-CM | POA: Diagnosis not present

## 2022-03-24 DIAGNOSIS — Z950 Presence of cardiac pacemaker: Secondary | ICD-10-CM | POA: Diagnosis not present

## 2022-03-24 DIAGNOSIS — I10 Essential (primary) hypertension: Secondary | ICD-10-CM | POA: Diagnosis not present

## 2022-03-24 DIAGNOSIS — M79671 Pain in right foot: Secondary | ICD-10-CM | POA: Diagnosis not present

## 2022-03-24 DIAGNOSIS — E46 Unspecified protein-calorie malnutrition: Secondary | ICD-10-CM | POA: Diagnosis not present

## 2022-03-24 DIAGNOSIS — M6259 Muscle wasting and atrophy, not elsewhere classified, multiple sites: Secondary | ICD-10-CM | POA: Diagnosis not present

## 2022-03-24 DIAGNOSIS — E785 Hyperlipidemia, unspecified: Secondary | ICD-10-CM | POA: Diagnosis not present

## 2022-03-24 DIAGNOSIS — J9601 Acute respiratory failure with hypoxia: Secondary | ICD-10-CM | POA: Diagnosis not present

## 2022-03-24 DIAGNOSIS — M6281 Muscle weakness (generalized): Secondary | ICD-10-CM | POA: Diagnosis not present

## 2022-03-24 DIAGNOSIS — S92314D Nondisplaced fracture of first metatarsal bone, right foot, subsequent encounter for fracture with routine healing: Secondary | ICD-10-CM | POA: Diagnosis not present

## 2022-03-24 DIAGNOSIS — I6930 Unspecified sequelae of cerebral infarction: Secondary | ICD-10-CM | POA: Diagnosis not present

## 2022-03-24 DIAGNOSIS — R2689 Other abnormalities of gait and mobility: Secondary | ICD-10-CM | POA: Diagnosis not present

## 2022-03-25 DIAGNOSIS — R2689 Other abnormalities of gait and mobility: Secondary | ICD-10-CM | POA: Diagnosis not present

## 2022-03-25 DIAGNOSIS — M6259 Muscle wasting and atrophy, not elsewhere classified, multiple sites: Secondary | ICD-10-CM | POA: Diagnosis not present

## 2022-03-25 DIAGNOSIS — E46 Unspecified protein-calorie malnutrition: Secondary | ICD-10-CM | POA: Diagnosis not present

## 2022-03-25 DIAGNOSIS — E785 Hyperlipidemia, unspecified: Secondary | ICD-10-CM | POA: Diagnosis not present

## 2022-03-25 DIAGNOSIS — I6930 Unspecified sequelae of cerebral infarction: Secondary | ICD-10-CM | POA: Diagnosis not present

## 2022-03-25 DIAGNOSIS — J9601 Acute respiratory failure with hypoxia: Secondary | ICD-10-CM | POA: Diagnosis not present

## 2022-03-25 DIAGNOSIS — E039 Hypothyroidism, unspecified: Secondary | ICD-10-CM | POA: Diagnosis not present

## 2022-03-25 DIAGNOSIS — I10 Essential (primary) hypertension: Secondary | ICD-10-CM | POA: Diagnosis not present

## 2022-03-25 DIAGNOSIS — Z950 Presence of cardiac pacemaker: Secondary | ICD-10-CM | POA: Diagnosis not present

## 2022-03-25 DIAGNOSIS — R1311 Dysphagia, oral phase: Secondary | ICD-10-CM | POA: Diagnosis not present

## 2022-03-25 DIAGNOSIS — M6281 Muscle weakness (generalized): Secondary | ICD-10-CM | POA: Diagnosis not present

## 2022-03-25 DIAGNOSIS — R269 Unspecified abnormalities of gait and mobility: Secondary | ICD-10-CM | POA: Diagnosis not present

## 2022-03-26 DIAGNOSIS — M6259 Muscle wasting and atrophy, not elsewhere classified, multiple sites: Secondary | ICD-10-CM | POA: Diagnosis not present

## 2022-03-26 DIAGNOSIS — R2689 Other abnormalities of gait and mobility: Secondary | ICD-10-CM | POA: Diagnosis not present

## 2022-03-26 DIAGNOSIS — E46 Unspecified protein-calorie malnutrition: Secondary | ICD-10-CM | POA: Diagnosis not present

## 2022-03-26 DIAGNOSIS — I6930 Unspecified sequelae of cerebral infarction: Secondary | ICD-10-CM | POA: Diagnosis not present

## 2022-03-26 DIAGNOSIS — I10 Essential (primary) hypertension: Secondary | ICD-10-CM | POA: Diagnosis not present

## 2022-03-26 DIAGNOSIS — R269 Unspecified abnormalities of gait and mobility: Secondary | ICD-10-CM | POA: Diagnosis not present

## 2022-03-26 DIAGNOSIS — J9601 Acute respiratory failure with hypoxia: Secondary | ICD-10-CM | POA: Diagnosis not present

## 2022-03-26 DIAGNOSIS — E039 Hypothyroidism, unspecified: Secondary | ICD-10-CM | POA: Diagnosis not present

## 2022-03-26 DIAGNOSIS — Z950 Presence of cardiac pacemaker: Secondary | ICD-10-CM | POA: Diagnosis not present

## 2022-03-26 DIAGNOSIS — E785 Hyperlipidemia, unspecified: Secondary | ICD-10-CM | POA: Diagnosis not present

## 2022-03-26 DIAGNOSIS — M6281 Muscle weakness (generalized): Secondary | ICD-10-CM | POA: Diagnosis not present

## 2022-03-26 DIAGNOSIS — R1311 Dysphagia, oral phase: Secondary | ICD-10-CM | POA: Diagnosis not present

## 2022-03-27 DIAGNOSIS — M6259 Muscle wasting and atrophy, not elsewhere classified, multiple sites: Secondary | ICD-10-CM | POA: Diagnosis not present

## 2022-03-27 DIAGNOSIS — M6281 Muscle weakness (generalized): Secondary | ICD-10-CM | POA: Diagnosis not present

## 2022-03-27 DIAGNOSIS — I6930 Unspecified sequelae of cerebral infarction: Secondary | ICD-10-CM | POA: Diagnosis not present

## 2022-03-27 DIAGNOSIS — I10 Essential (primary) hypertension: Secondary | ICD-10-CM | POA: Diagnosis not present

## 2022-03-27 DIAGNOSIS — R269 Unspecified abnormalities of gait and mobility: Secondary | ICD-10-CM | POA: Diagnosis not present

## 2022-03-27 DIAGNOSIS — Z950 Presence of cardiac pacemaker: Secondary | ICD-10-CM | POA: Diagnosis not present

## 2022-03-27 DIAGNOSIS — R2689 Other abnormalities of gait and mobility: Secondary | ICD-10-CM | POA: Diagnosis not present

## 2022-03-27 DIAGNOSIS — E785 Hyperlipidemia, unspecified: Secondary | ICD-10-CM | POA: Diagnosis not present

## 2022-03-27 DIAGNOSIS — E46 Unspecified protein-calorie malnutrition: Secondary | ICD-10-CM | POA: Diagnosis not present

## 2022-03-27 DIAGNOSIS — R1311 Dysphagia, oral phase: Secondary | ICD-10-CM | POA: Diagnosis not present

## 2022-03-27 DIAGNOSIS — E039 Hypothyroidism, unspecified: Secondary | ICD-10-CM | POA: Diagnosis not present

## 2022-03-27 DIAGNOSIS — J9601 Acute respiratory failure with hypoxia: Secondary | ICD-10-CM | POA: Diagnosis not present

## 2022-03-30 DIAGNOSIS — Z950 Presence of cardiac pacemaker: Secondary | ICD-10-CM | POA: Diagnosis not present

## 2022-03-30 DIAGNOSIS — M6281 Muscle weakness (generalized): Secondary | ICD-10-CM | POA: Diagnosis not present

## 2022-03-30 DIAGNOSIS — E039 Hypothyroidism, unspecified: Secondary | ICD-10-CM | POA: Diagnosis not present

## 2022-03-30 DIAGNOSIS — J9601 Acute respiratory failure with hypoxia: Secondary | ICD-10-CM | POA: Diagnosis not present

## 2022-03-30 DIAGNOSIS — E46 Unspecified protein-calorie malnutrition: Secondary | ICD-10-CM | POA: Diagnosis not present

## 2022-03-30 DIAGNOSIS — R1311 Dysphagia, oral phase: Secondary | ICD-10-CM | POA: Diagnosis not present

## 2022-03-30 DIAGNOSIS — R2689 Other abnormalities of gait and mobility: Secondary | ICD-10-CM | POA: Diagnosis not present

## 2022-03-30 DIAGNOSIS — E785 Hyperlipidemia, unspecified: Secondary | ICD-10-CM | POA: Diagnosis not present

## 2022-03-30 DIAGNOSIS — R269 Unspecified abnormalities of gait and mobility: Secondary | ICD-10-CM | POA: Diagnosis not present

## 2022-03-30 DIAGNOSIS — I10 Essential (primary) hypertension: Secondary | ICD-10-CM | POA: Diagnosis not present

## 2022-03-30 DIAGNOSIS — I6930 Unspecified sequelae of cerebral infarction: Secondary | ICD-10-CM | POA: Diagnosis not present

## 2022-03-30 DIAGNOSIS — M6259 Muscle wasting and atrophy, not elsewhere classified, multiple sites: Secondary | ICD-10-CM | POA: Diagnosis not present

## 2022-03-31 DIAGNOSIS — E785 Hyperlipidemia, unspecified: Secondary | ICD-10-CM | POA: Diagnosis not present

## 2022-03-31 DIAGNOSIS — Z950 Presence of cardiac pacemaker: Secondary | ICD-10-CM | POA: Diagnosis not present

## 2022-03-31 DIAGNOSIS — I69311 Memory deficit following cerebral infarction: Secondary | ICD-10-CM | POA: Diagnosis not present

## 2022-03-31 DIAGNOSIS — R2689 Other abnormalities of gait and mobility: Secondary | ICD-10-CM | POA: Diagnosis not present

## 2022-03-31 DIAGNOSIS — M6259 Muscle wasting and atrophy, not elsewhere classified, multiple sites: Secondary | ICD-10-CM | POA: Diagnosis not present

## 2022-03-31 DIAGNOSIS — I6930 Unspecified sequelae of cerebral infarction: Secondary | ICD-10-CM | POA: Diagnosis not present

## 2022-03-31 DIAGNOSIS — R1311 Dysphagia, oral phase: Secondary | ICD-10-CM | POA: Diagnosis not present

## 2022-03-31 DIAGNOSIS — R269 Unspecified abnormalities of gait and mobility: Secondary | ICD-10-CM | POA: Diagnosis not present

## 2022-03-31 DIAGNOSIS — I10 Essential (primary) hypertension: Secondary | ICD-10-CM | POA: Diagnosis not present

## 2022-03-31 DIAGNOSIS — E46 Unspecified protein-calorie malnutrition: Secondary | ICD-10-CM | POA: Diagnosis not present

## 2022-03-31 DIAGNOSIS — F01511 Vascular dementia, unspecified severity, with agitation: Secondary | ICD-10-CM | POA: Diagnosis not present

## 2022-03-31 DIAGNOSIS — E039 Hypothyroidism, unspecified: Secondary | ICD-10-CM | POA: Diagnosis not present

## 2022-03-31 DIAGNOSIS — J9601 Acute respiratory failure with hypoxia: Secondary | ICD-10-CM | POA: Diagnosis not present

## 2022-03-31 DIAGNOSIS — M6281 Muscle weakness (generalized): Secondary | ICD-10-CM | POA: Diagnosis not present

## 2022-03-31 DIAGNOSIS — R451 Restlessness and agitation: Secondary | ICD-10-CM | POA: Diagnosis not present

## 2022-04-01 DIAGNOSIS — M6281 Muscle weakness (generalized): Secondary | ICD-10-CM | POA: Diagnosis not present

## 2022-04-01 DIAGNOSIS — E46 Unspecified protein-calorie malnutrition: Secondary | ICD-10-CM | POA: Diagnosis not present

## 2022-04-01 DIAGNOSIS — E785 Hyperlipidemia, unspecified: Secondary | ICD-10-CM | POA: Diagnosis not present

## 2022-04-01 DIAGNOSIS — Z950 Presence of cardiac pacemaker: Secondary | ICD-10-CM | POA: Diagnosis not present

## 2022-04-01 DIAGNOSIS — E039 Hypothyroidism, unspecified: Secondary | ICD-10-CM | POA: Diagnosis not present

## 2022-04-01 DIAGNOSIS — R2689 Other abnormalities of gait and mobility: Secondary | ICD-10-CM | POA: Diagnosis not present

## 2022-04-01 DIAGNOSIS — R1311 Dysphagia, oral phase: Secondary | ICD-10-CM | POA: Diagnosis not present

## 2022-04-01 DIAGNOSIS — J9601 Acute respiratory failure with hypoxia: Secondary | ICD-10-CM | POA: Diagnosis not present

## 2022-04-01 DIAGNOSIS — I10 Essential (primary) hypertension: Secondary | ICD-10-CM | POA: Diagnosis not present

## 2022-04-01 DIAGNOSIS — I6930 Unspecified sequelae of cerebral infarction: Secondary | ICD-10-CM | POA: Diagnosis not present

## 2022-04-01 DIAGNOSIS — M6259 Muscle wasting and atrophy, not elsewhere classified, multiple sites: Secondary | ICD-10-CM | POA: Diagnosis not present

## 2022-04-01 DIAGNOSIS — R269 Unspecified abnormalities of gait and mobility: Secondary | ICD-10-CM | POA: Diagnosis not present

## 2022-04-02 DIAGNOSIS — Z950 Presence of cardiac pacemaker: Secondary | ICD-10-CM | POA: Diagnosis not present

## 2022-04-02 DIAGNOSIS — I10 Essential (primary) hypertension: Secondary | ICD-10-CM | POA: Diagnosis not present

## 2022-04-02 DIAGNOSIS — M6281 Muscle weakness (generalized): Secondary | ICD-10-CM | POA: Diagnosis not present

## 2022-04-02 DIAGNOSIS — E039 Hypothyroidism, unspecified: Secondary | ICD-10-CM | POA: Diagnosis not present

## 2022-04-02 DIAGNOSIS — R269 Unspecified abnormalities of gait and mobility: Secondary | ICD-10-CM | POA: Diagnosis not present

## 2022-04-02 DIAGNOSIS — M6259 Muscle wasting and atrophy, not elsewhere classified, multiple sites: Secondary | ICD-10-CM | POA: Diagnosis not present

## 2022-04-02 DIAGNOSIS — E785 Hyperlipidemia, unspecified: Secondary | ICD-10-CM | POA: Diagnosis not present

## 2022-04-02 DIAGNOSIS — J9601 Acute respiratory failure with hypoxia: Secondary | ICD-10-CM | POA: Diagnosis not present

## 2022-04-02 DIAGNOSIS — R1311 Dysphagia, oral phase: Secondary | ICD-10-CM | POA: Diagnosis not present

## 2022-04-02 DIAGNOSIS — R2689 Other abnormalities of gait and mobility: Secondary | ICD-10-CM | POA: Diagnosis not present

## 2022-04-02 DIAGNOSIS — I6930 Unspecified sequelae of cerebral infarction: Secondary | ICD-10-CM | POA: Diagnosis not present

## 2022-04-02 DIAGNOSIS — E46 Unspecified protein-calorie malnutrition: Secondary | ICD-10-CM | POA: Diagnosis not present

## 2022-04-03 DIAGNOSIS — I10 Essential (primary) hypertension: Secondary | ICD-10-CM | POA: Diagnosis not present

## 2022-04-03 DIAGNOSIS — I6930 Unspecified sequelae of cerebral infarction: Secondary | ICD-10-CM | POA: Diagnosis not present

## 2022-04-03 DIAGNOSIS — Z950 Presence of cardiac pacemaker: Secondary | ICD-10-CM | POA: Diagnosis not present

## 2022-04-03 DIAGNOSIS — E785 Hyperlipidemia, unspecified: Secondary | ICD-10-CM | POA: Diagnosis not present

## 2022-04-03 DIAGNOSIS — E46 Unspecified protein-calorie malnutrition: Secondary | ICD-10-CM | POA: Diagnosis not present

## 2022-04-03 DIAGNOSIS — M6259 Muscle wasting and atrophy, not elsewhere classified, multiple sites: Secondary | ICD-10-CM | POA: Diagnosis not present

## 2022-04-03 DIAGNOSIS — R2689 Other abnormalities of gait and mobility: Secondary | ICD-10-CM | POA: Diagnosis not present

## 2022-04-03 DIAGNOSIS — R269 Unspecified abnormalities of gait and mobility: Secondary | ICD-10-CM | POA: Diagnosis not present

## 2022-04-03 DIAGNOSIS — M6281 Muscle weakness (generalized): Secondary | ICD-10-CM | POA: Diagnosis not present

## 2022-04-03 DIAGNOSIS — E039 Hypothyroidism, unspecified: Secondary | ICD-10-CM | POA: Diagnosis not present

## 2022-04-03 DIAGNOSIS — R1311 Dysphagia, oral phase: Secondary | ICD-10-CM | POA: Diagnosis not present

## 2022-04-03 DIAGNOSIS — J9601 Acute respiratory failure with hypoxia: Secondary | ICD-10-CM | POA: Diagnosis not present

## 2022-04-06 DIAGNOSIS — I10 Essential (primary) hypertension: Secondary | ICD-10-CM | POA: Diagnosis not present

## 2022-04-06 DIAGNOSIS — E46 Unspecified protein-calorie malnutrition: Secondary | ICD-10-CM | POA: Diagnosis not present

## 2022-04-06 DIAGNOSIS — I6930 Unspecified sequelae of cerebral infarction: Secondary | ICD-10-CM | POA: Diagnosis not present

## 2022-04-06 DIAGNOSIS — J9601 Acute respiratory failure with hypoxia: Secondary | ICD-10-CM | POA: Diagnosis not present

## 2022-04-06 DIAGNOSIS — E039 Hypothyroidism, unspecified: Secondary | ICD-10-CM | POA: Diagnosis not present

## 2022-04-06 DIAGNOSIS — R1311 Dysphagia, oral phase: Secondary | ICD-10-CM | POA: Diagnosis not present

## 2022-04-06 DIAGNOSIS — R2689 Other abnormalities of gait and mobility: Secondary | ICD-10-CM | POA: Diagnosis not present

## 2022-04-06 DIAGNOSIS — E785 Hyperlipidemia, unspecified: Secondary | ICD-10-CM | POA: Diagnosis not present

## 2022-04-06 DIAGNOSIS — R269 Unspecified abnormalities of gait and mobility: Secondary | ICD-10-CM | POA: Diagnosis not present

## 2022-04-06 DIAGNOSIS — M6281 Muscle weakness (generalized): Secondary | ICD-10-CM | POA: Diagnosis not present

## 2022-04-06 DIAGNOSIS — Z950 Presence of cardiac pacemaker: Secondary | ICD-10-CM | POA: Diagnosis not present

## 2022-04-06 DIAGNOSIS — M6259 Muscle wasting and atrophy, not elsewhere classified, multiple sites: Secondary | ICD-10-CM | POA: Diagnosis not present

## 2022-04-07 DIAGNOSIS — R269 Unspecified abnormalities of gait and mobility: Secondary | ICD-10-CM | POA: Diagnosis not present

## 2022-04-07 DIAGNOSIS — I10 Essential (primary) hypertension: Secondary | ICD-10-CM | POA: Diagnosis not present

## 2022-04-07 DIAGNOSIS — E039 Hypothyroidism, unspecified: Secondary | ICD-10-CM | POA: Diagnosis not present

## 2022-04-07 DIAGNOSIS — E785 Hyperlipidemia, unspecified: Secondary | ICD-10-CM | POA: Diagnosis not present

## 2022-04-07 DIAGNOSIS — J9601 Acute respiratory failure with hypoxia: Secondary | ICD-10-CM | POA: Diagnosis not present

## 2022-04-07 DIAGNOSIS — M6281 Muscle weakness (generalized): Secondary | ICD-10-CM | POA: Diagnosis not present

## 2022-04-07 DIAGNOSIS — Z950 Presence of cardiac pacemaker: Secondary | ICD-10-CM | POA: Diagnosis not present

## 2022-04-07 DIAGNOSIS — I6930 Unspecified sequelae of cerebral infarction: Secondary | ICD-10-CM | POA: Diagnosis not present

## 2022-04-07 DIAGNOSIS — E46 Unspecified protein-calorie malnutrition: Secondary | ICD-10-CM | POA: Diagnosis not present

## 2022-04-07 DIAGNOSIS — R1311 Dysphagia, oral phase: Secondary | ICD-10-CM | POA: Diagnosis not present

## 2022-04-07 DIAGNOSIS — R2689 Other abnormalities of gait and mobility: Secondary | ICD-10-CM | POA: Diagnosis not present

## 2022-04-07 DIAGNOSIS — M6259 Muscle wasting and atrophy, not elsewhere classified, multiple sites: Secondary | ICD-10-CM | POA: Diagnosis not present

## 2022-04-08 DIAGNOSIS — M6281 Muscle weakness (generalized): Secondary | ICD-10-CM | POA: Diagnosis not present

## 2022-04-08 DIAGNOSIS — Z950 Presence of cardiac pacemaker: Secondary | ICD-10-CM | POA: Diagnosis not present

## 2022-04-08 DIAGNOSIS — R269 Unspecified abnormalities of gait and mobility: Secondary | ICD-10-CM | POA: Diagnosis not present

## 2022-04-08 DIAGNOSIS — R1311 Dysphagia, oral phase: Secondary | ICD-10-CM | POA: Diagnosis not present

## 2022-04-08 DIAGNOSIS — E039 Hypothyroidism, unspecified: Secondary | ICD-10-CM | POA: Diagnosis not present

## 2022-04-08 DIAGNOSIS — I10 Essential (primary) hypertension: Secondary | ICD-10-CM | POA: Diagnosis not present

## 2022-04-08 DIAGNOSIS — M6259 Muscle wasting and atrophy, not elsewhere classified, multiple sites: Secondary | ICD-10-CM | POA: Diagnosis not present

## 2022-04-08 DIAGNOSIS — R2689 Other abnormalities of gait and mobility: Secondary | ICD-10-CM | POA: Diagnosis not present

## 2022-04-08 DIAGNOSIS — J9601 Acute respiratory failure with hypoxia: Secondary | ICD-10-CM | POA: Diagnosis not present

## 2022-04-08 DIAGNOSIS — I6930 Unspecified sequelae of cerebral infarction: Secondary | ICD-10-CM | POA: Diagnosis not present

## 2022-04-08 DIAGNOSIS — E785 Hyperlipidemia, unspecified: Secondary | ICD-10-CM | POA: Diagnosis not present

## 2022-04-08 DIAGNOSIS — E46 Unspecified protein-calorie malnutrition: Secondary | ICD-10-CM | POA: Diagnosis not present

## 2022-04-09 DIAGNOSIS — E039 Hypothyroidism, unspecified: Secondary | ICD-10-CM | POA: Diagnosis not present

## 2022-04-09 DIAGNOSIS — R269 Unspecified abnormalities of gait and mobility: Secondary | ICD-10-CM | POA: Diagnosis not present

## 2022-04-09 DIAGNOSIS — M6281 Muscle weakness (generalized): Secondary | ICD-10-CM | POA: Diagnosis not present

## 2022-04-09 DIAGNOSIS — E785 Hyperlipidemia, unspecified: Secondary | ICD-10-CM | POA: Diagnosis not present

## 2022-04-09 DIAGNOSIS — R2689 Other abnormalities of gait and mobility: Secondary | ICD-10-CM | POA: Diagnosis not present

## 2022-04-09 DIAGNOSIS — Z950 Presence of cardiac pacemaker: Secondary | ICD-10-CM | POA: Diagnosis not present

## 2022-04-09 DIAGNOSIS — R1311 Dysphagia, oral phase: Secondary | ICD-10-CM | POA: Diagnosis not present

## 2022-04-09 DIAGNOSIS — M6259 Muscle wasting and atrophy, not elsewhere classified, multiple sites: Secondary | ICD-10-CM | POA: Diagnosis not present

## 2022-04-09 DIAGNOSIS — E46 Unspecified protein-calorie malnutrition: Secondary | ICD-10-CM | POA: Diagnosis not present

## 2022-04-09 DIAGNOSIS — I6930 Unspecified sequelae of cerebral infarction: Secondary | ICD-10-CM | POA: Diagnosis not present

## 2022-04-09 DIAGNOSIS — J9601 Acute respiratory failure with hypoxia: Secondary | ICD-10-CM | POA: Diagnosis not present

## 2022-04-09 DIAGNOSIS — I10 Essential (primary) hypertension: Secondary | ICD-10-CM | POA: Diagnosis not present

## 2022-04-10 DIAGNOSIS — E46 Unspecified protein-calorie malnutrition: Secondary | ICD-10-CM | POA: Diagnosis not present

## 2022-04-10 DIAGNOSIS — M6259 Muscle wasting and atrophy, not elsewhere classified, multiple sites: Secondary | ICD-10-CM | POA: Diagnosis not present

## 2022-04-10 DIAGNOSIS — E039 Hypothyroidism, unspecified: Secondary | ICD-10-CM | POA: Diagnosis not present

## 2022-04-10 DIAGNOSIS — J9601 Acute respiratory failure with hypoxia: Secondary | ICD-10-CM | POA: Diagnosis not present

## 2022-04-10 DIAGNOSIS — E785 Hyperlipidemia, unspecified: Secondary | ICD-10-CM | POA: Diagnosis not present

## 2022-04-10 DIAGNOSIS — R1311 Dysphagia, oral phase: Secondary | ICD-10-CM | POA: Diagnosis not present

## 2022-04-10 DIAGNOSIS — M6281 Muscle weakness (generalized): Secondary | ICD-10-CM | POA: Diagnosis not present

## 2022-04-10 DIAGNOSIS — Z950 Presence of cardiac pacemaker: Secondary | ICD-10-CM | POA: Diagnosis not present

## 2022-04-10 DIAGNOSIS — R2689 Other abnormalities of gait and mobility: Secondary | ICD-10-CM | POA: Diagnosis not present

## 2022-04-10 DIAGNOSIS — R269 Unspecified abnormalities of gait and mobility: Secondary | ICD-10-CM | POA: Diagnosis not present

## 2022-04-10 DIAGNOSIS — I6930 Unspecified sequelae of cerebral infarction: Secondary | ICD-10-CM | POA: Diagnosis not present

## 2022-04-10 DIAGNOSIS — I10 Essential (primary) hypertension: Secondary | ICD-10-CM | POA: Diagnosis not present

## 2022-04-13 DIAGNOSIS — M6281 Muscle weakness (generalized): Secondary | ICD-10-CM | POA: Diagnosis not present

## 2022-04-13 DIAGNOSIS — E46 Unspecified protein-calorie malnutrition: Secondary | ICD-10-CM | POA: Diagnosis not present

## 2022-04-13 DIAGNOSIS — R1311 Dysphagia, oral phase: Secondary | ICD-10-CM | POA: Diagnosis not present

## 2022-04-13 DIAGNOSIS — E785 Hyperlipidemia, unspecified: Secondary | ICD-10-CM | POA: Diagnosis not present

## 2022-04-13 DIAGNOSIS — J9601 Acute respiratory failure with hypoxia: Secondary | ICD-10-CM | POA: Diagnosis not present

## 2022-04-13 DIAGNOSIS — R2689 Other abnormalities of gait and mobility: Secondary | ICD-10-CM | POA: Diagnosis not present

## 2022-04-13 DIAGNOSIS — M6259 Muscle wasting and atrophy, not elsewhere classified, multiple sites: Secondary | ICD-10-CM | POA: Diagnosis not present

## 2022-04-13 DIAGNOSIS — I10 Essential (primary) hypertension: Secondary | ICD-10-CM | POA: Diagnosis not present

## 2022-04-13 DIAGNOSIS — E039 Hypothyroidism, unspecified: Secondary | ICD-10-CM | POA: Diagnosis not present

## 2022-04-13 DIAGNOSIS — Z950 Presence of cardiac pacemaker: Secondary | ICD-10-CM | POA: Diagnosis not present

## 2022-04-13 DIAGNOSIS — I6930 Unspecified sequelae of cerebral infarction: Secondary | ICD-10-CM | POA: Diagnosis not present

## 2022-04-13 DIAGNOSIS — R269 Unspecified abnormalities of gait and mobility: Secondary | ICD-10-CM | POA: Diagnosis not present

## 2022-04-14 DIAGNOSIS — E039 Hypothyroidism, unspecified: Secondary | ICD-10-CM | POA: Diagnosis not present

## 2022-04-14 DIAGNOSIS — E46 Unspecified protein-calorie malnutrition: Secondary | ICD-10-CM | POA: Diagnosis not present

## 2022-04-14 DIAGNOSIS — R2689 Other abnormalities of gait and mobility: Secondary | ICD-10-CM | POA: Diagnosis not present

## 2022-04-14 DIAGNOSIS — R1311 Dysphagia, oral phase: Secondary | ICD-10-CM | POA: Diagnosis not present

## 2022-04-14 DIAGNOSIS — M6259 Muscle wasting and atrophy, not elsewhere classified, multiple sites: Secondary | ICD-10-CM | POA: Diagnosis not present

## 2022-04-14 DIAGNOSIS — J9601 Acute respiratory failure with hypoxia: Secondary | ICD-10-CM | POA: Diagnosis not present

## 2022-04-14 DIAGNOSIS — E785 Hyperlipidemia, unspecified: Secondary | ICD-10-CM | POA: Diagnosis not present

## 2022-04-14 DIAGNOSIS — Z950 Presence of cardiac pacemaker: Secondary | ICD-10-CM | POA: Diagnosis not present

## 2022-04-14 DIAGNOSIS — R269 Unspecified abnormalities of gait and mobility: Secondary | ICD-10-CM | POA: Diagnosis not present

## 2022-04-14 DIAGNOSIS — I10 Essential (primary) hypertension: Secondary | ICD-10-CM | POA: Diagnosis not present

## 2022-04-14 DIAGNOSIS — I6930 Unspecified sequelae of cerebral infarction: Secondary | ICD-10-CM | POA: Diagnosis not present

## 2022-04-14 DIAGNOSIS — M6281 Muscle weakness (generalized): Secondary | ICD-10-CM | POA: Diagnosis not present

## 2022-04-15 DIAGNOSIS — R269 Unspecified abnormalities of gait and mobility: Secondary | ICD-10-CM | POA: Diagnosis not present

## 2022-04-15 DIAGNOSIS — I6930 Unspecified sequelae of cerebral infarction: Secondary | ICD-10-CM | POA: Diagnosis not present

## 2022-04-15 DIAGNOSIS — E785 Hyperlipidemia, unspecified: Secondary | ICD-10-CM | POA: Diagnosis not present

## 2022-04-15 DIAGNOSIS — E46 Unspecified protein-calorie malnutrition: Secondary | ICD-10-CM | POA: Diagnosis not present

## 2022-04-15 DIAGNOSIS — I10 Essential (primary) hypertension: Secondary | ICD-10-CM | POA: Diagnosis not present

## 2022-04-15 DIAGNOSIS — M6259 Muscle wasting and atrophy, not elsewhere classified, multiple sites: Secondary | ICD-10-CM | POA: Diagnosis not present

## 2022-04-15 DIAGNOSIS — E039 Hypothyroidism, unspecified: Secondary | ICD-10-CM | POA: Diagnosis not present

## 2022-04-15 DIAGNOSIS — Z950 Presence of cardiac pacemaker: Secondary | ICD-10-CM | POA: Diagnosis not present

## 2022-04-15 DIAGNOSIS — J9601 Acute respiratory failure with hypoxia: Secondary | ICD-10-CM | POA: Diagnosis not present

## 2022-04-15 DIAGNOSIS — M6281 Muscle weakness (generalized): Secondary | ICD-10-CM | POA: Diagnosis not present

## 2022-04-15 DIAGNOSIS — R2689 Other abnormalities of gait and mobility: Secondary | ICD-10-CM | POA: Diagnosis not present

## 2022-04-15 DIAGNOSIS — R1311 Dysphagia, oral phase: Secondary | ICD-10-CM | POA: Diagnosis not present

## 2022-04-16 DIAGNOSIS — Z950 Presence of cardiac pacemaker: Secondary | ICD-10-CM | POA: Diagnosis not present

## 2022-04-16 DIAGNOSIS — R2689 Other abnormalities of gait and mobility: Secondary | ICD-10-CM | POA: Diagnosis not present

## 2022-04-16 DIAGNOSIS — E785 Hyperlipidemia, unspecified: Secondary | ICD-10-CM | POA: Diagnosis not present

## 2022-04-16 DIAGNOSIS — R1311 Dysphagia, oral phase: Secondary | ICD-10-CM | POA: Diagnosis not present

## 2022-04-16 DIAGNOSIS — I10 Essential (primary) hypertension: Secondary | ICD-10-CM | POA: Diagnosis not present

## 2022-04-16 DIAGNOSIS — E039 Hypothyroidism, unspecified: Secondary | ICD-10-CM | POA: Diagnosis not present

## 2022-04-16 DIAGNOSIS — E46 Unspecified protein-calorie malnutrition: Secondary | ICD-10-CM | POA: Diagnosis not present

## 2022-04-16 DIAGNOSIS — M6281 Muscle weakness (generalized): Secondary | ICD-10-CM | POA: Diagnosis not present

## 2022-04-16 DIAGNOSIS — M6259 Muscle wasting and atrophy, not elsewhere classified, multiple sites: Secondary | ICD-10-CM | POA: Diagnosis not present

## 2022-04-16 DIAGNOSIS — I6930 Unspecified sequelae of cerebral infarction: Secondary | ICD-10-CM | POA: Diagnosis not present

## 2022-04-16 DIAGNOSIS — R269 Unspecified abnormalities of gait and mobility: Secondary | ICD-10-CM | POA: Diagnosis not present

## 2022-04-16 DIAGNOSIS — J9601 Acute respiratory failure with hypoxia: Secondary | ICD-10-CM | POA: Diagnosis not present

## 2022-04-17 DIAGNOSIS — R2689 Other abnormalities of gait and mobility: Secondary | ICD-10-CM | POA: Diagnosis not present

## 2022-04-17 DIAGNOSIS — M6259 Muscle wasting and atrophy, not elsewhere classified, multiple sites: Secondary | ICD-10-CM | POA: Diagnosis not present

## 2022-04-17 DIAGNOSIS — R269 Unspecified abnormalities of gait and mobility: Secondary | ICD-10-CM | POA: Diagnosis not present

## 2022-04-17 DIAGNOSIS — I6930 Unspecified sequelae of cerebral infarction: Secondary | ICD-10-CM | POA: Diagnosis not present

## 2022-04-17 DIAGNOSIS — E039 Hypothyroidism, unspecified: Secondary | ICD-10-CM | POA: Diagnosis not present

## 2022-04-17 DIAGNOSIS — R1311 Dysphagia, oral phase: Secondary | ICD-10-CM | POA: Diagnosis not present

## 2022-04-17 DIAGNOSIS — I10 Essential (primary) hypertension: Secondary | ICD-10-CM | POA: Diagnosis not present

## 2022-04-17 DIAGNOSIS — E46 Unspecified protein-calorie malnutrition: Secondary | ICD-10-CM | POA: Diagnosis not present

## 2022-04-17 DIAGNOSIS — E785 Hyperlipidemia, unspecified: Secondary | ICD-10-CM | POA: Diagnosis not present

## 2022-04-17 DIAGNOSIS — M6281 Muscle weakness (generalized): Secondary | ICD-10-CM | POA: Diagnosis not present

## 2022-04-17 DIAGNOSIS — J9601 Acute respiratory failure with hypoxia: Secondary | ICD-10-CM | POA: Diagnosis not present

## 2022-04-17 DIAGNOSIS — Z950 Presence of cardiac pacemaker: Secondary | ICD-10-CM | POA: Diagnosis not present

## 2022-04-20 DIAGNOSIS — E039 Hypothyroidism, unspecified: Secondary | ICD-10-CM | POA: Diagnosis not present

## 2022-04-20 DIAGNOSIS — M6259 Muscle wasting and atrophy, not elsewhere classified, multiple sites: Secondary | ICD-10-CM | POA: Diagnosis not present

## 2022-04-20 DIAGNOSIS — J9601 Acute respiratory failure with hypoxia: Secondary | ICD-10-CM | POA: Diagnosis not present

## 2022-04-20 DIAGNOSIS — I6930 Unspecified sequelae of cerebral infarction: Secondary | ICD-10-CM | POA: Diagnosis not present

## 2022-04-20 DIAGNOSIS — E785 Hyperlipidemia, unspecified: Secondary | ICD-10-CM | POA: Diagnosis not present

## 2022-04-20 DIAGNOSIS — R2689 Other abnormalities of gait and mobility: Secondary | ICD-10-CM | POA: Diagnosis not present

## 2022-04-20 DIAGNOSIS — E46 Unspecified protein-calorie malnutrition: Secondary | ICD-10-CM | POA: Diagnosis not present

## 2022-04-20 DIAGNOSIS — R269 Unspecified abnormalities of gait and mobility: Secondary | ICD-10-CM | POA: Diagnosis not present

## 2022-04-20 DIAGNOSIS — R1311 Dysphagia, oral phase: Secondary | ICD-10-CM | POA: Diagnosis not present

## 2022-04-20 DIAGNOSIS — Z950 Presence of cardiac pacemaker: Secondary | ICD-10-CM | POA: Diagnosis not present

## 2022-04-20 DIAGNOSIS — M6281 Muscle weakness (generalized): Secondary | ICD-10-CM | POA: Diagnosis not present

## 2022-04-20 DIAGNOSIS — I10 Essential (primary) hypertension: Secondary | ICD-10-CM | POA: Diagnosis not present

## 2022-04-21 DIAGNOSIS — M6281 Muscle weakness (generalized): Secondary | ICD-10-CM | POA: Diagnosis not present

## 2022-04-21 DIAGNOSIS — I10 Essential (primary) hypertension: Secondary | ICD-10-CM | POA: Diagnosis not present

## 2022-04-21 DIAGNOSIS — I69311 Memory deficit following cerebral infarction: Secondary | ICD-10-CM | POA: Diagnosis not present

## 2022-04-21 DIAGNOSIS — R1311 Dysphagia, oral phase: Secondary | ICD-10-CM | POA: Diagnosis not present

## 2022-04-21 DIAGNOSIS — R2689 Other abnormalities of gait and mobility: Secondary | ICD-10-CM | POA: Diagnosis not present

## 2022-04-21 DIAGNOSIS — E46 Unspecified protein-calorie malnutrition: Secondary | ICD-10-CM | POA: Diagnosis not present

## 2022-04-21 DIAGNOSIS — I6930 Unspecified sequelae of cerebral infarction: Secondary | ICD-10-CM | POA: Diagnosis not present

## 2022-04-21 DIAGNOSIS — E039 Hypothyroidism, unspecified: Secondary | ICD-10-CM | POA: Diagnosis not present

## 2022-04-21 DIAGNOSIS — Z950 Presence of cardiac pacemaker: Secondary | ICD-10-CM | POA: Diagnosis not present

## 2022-04-21 DIAGNOSIS — E785 Hyperlipidemia, unspecified: Secondary | ICD-10-CM | POA: Diagnosis not present

## 2022-04-21 DIAGNOSIS — J9601 Acute respiratory failure with hypoxia: Secondary | ICD-10-CM | POA: Diagnosis not present

## 2022-04-21 DIAGNOSIS — R269 Unspecified abnormalities of gait and mobility: Secondary | ICD-10-CM | POA: Diagnosis not present

## 2022-04-21 DIAGNOSIS — F01511 Vascular dementia, unspecified severity, with agitation: Secondary | ICD-10-CM | POA: Diagnosis not present

## 2022-04-21 DIAGNOSIS — M6259 Muscle wasting and atrophy, not elsewhere classified, multiple sites: Secondary | ICD-10-CM | POA: Diagnosis not present

## 2022-04-22 ENCOUNTER — Other Ambulatory Visit: Payer: Self-pay | Admitting: Hematology and Oncology

## 2022-04-22 DIAGNOSIS — M6259 Muscle wasting and atrophy, not elsewhere classified, multiple sites: Secondary | ICD-10-CM | POA: Diagnosis not present

## 2022-04-22 DIAGNOSIS — Z950 Presence of cardiac pacemaker: Secondary | ICD-10-CM | POA: Diagnosis not present

## 2022-04-22 DIAGNOSIS — E46 Unspecified protein-calorie malnutrition: Secondary | ICD-10-CM | POA: Diagnosis not present

## 2022-04-22 DIAGNOSIS — I119 Hypertensive heart disease without heart failure: Secondary | ICD-10-CM | POA: Diagnosis not present

## 2022-04-22 DIAGNOSIS — R2689 Other abnormalities of gait and mobility: Secondary | ICD-10-CM | POA: Diagnosis not present

## 2022-04-22 DIAGNOSIS — R269 Unspecified abnormalities of gait and mobility: Secondary | ICD-10-CM | POA: Diagnosis not present

## 2022-04-22 DIAGNOSIS — I10 Essential (primary) hypertension: Secondary | ICD-10-CM | POA: Diagnosis not present

## 2022-04-22 DIAGNOSIS — R1311 Dysphagia, oral phase: Secondary | ICD-10-CM | POA: Diagnosis not present

## 2022-04-22 DIAGNOSIS — I6930 Unspecified sequelae of cerebral infarction: Secondary | ICD-10-CM | POA: Diagnosis not present

## 2022-04-22 DIAGNOSIS — F03918 Unspecified dementia, unspecified severity, with other behavioral disturbance: Secondary | ICD-10-CM | POA: Diagnosis not present

## 2022-04-22 DIAGNOSIS — J9601 Acute respiratory failure with hypoxia: Secondary | ICD-10-CM | POA: Diagnosis not present

## 2022-04-22 DIAGNOSIS — E785 Hyperlipidemia, unspecified: Secondary | ICD-10-CM | POA: Diagnosis not present

## 2022-04-22 DIAGNOSIS — E039 Hypothyroidism, unspecified: Secondary | ICD-10-CM | POA: Diagnosis not present

## 2022-04-22 DIAGNOSIS — M6281 Muscle weakness (generalized): Secondary | ICD-10-CM | POA: Diagnosis not present

## 2022-04-23 DIAGNOSIS — J9601 Acute respiratory failure with hypoxia: Secondary | ICD-10-CM | POA: Diagnosis not present

## 2022-04-23 DIAGNOSIS — I6930 Unspecified sequelae of cerebral infarction: Secondary | ICD-10-CM | POA: Diagnosis not present

## 2022-04-23 DIAGNOSIS — M6259 Muscle wasting and atrophy, not elsewhere classified, multiple sites: Secondary | ICD-10-CM | POA: Diagnosis not present

## 2022-04-23 DIAGNOSIS — E785 Hyperlipidemia, unspecified: Secondary | ICD-10-CM | POA: Diagnosis not present

## 2022-04-23 DIAGNOSIS — R2689 Other abnormalities of gait and mobility: Secondary | ICD-10-CM | POA: Diagnosis not present

## 2022-04-23 DIAGNOSIS — E46 Unspecified protein-calorie malnutrition: Secondary | ICD-10-CM | POA: Diagnosis not present

## 2022-04-23 DIAGNOSIS — R1311 Dysphagia, oral phase: Secondary | ICD-10-CM | POA: Diagnosis not present

## 2022-04-23 DIAGNOSIS — Z950 Presence of cardiac pacemaker: Secondary | ICD-10-CM | POA: Diagnosis not present

## 2022-04-23 DIAGNOSIS — E039 Hypothyroidism, unspecified: Secondary | ICD-10-CM | POA: Diagnosis not present

## 2022-04-23 DIAGNOSIS — I10 Essential (primary) hypertension: Secondary | ICD-10-CM | POA: Diagnosis not present

## 2022-04-23 DIAGNOSIS — R269 Unspecified abnormalities of gait and mobility: Secondary | ICD-10-CM | POA: Diagnosis not present

## 2022-04-23 DIAGNOSIS — M6281 Muscle weakness (generalized): Secondary | ICD-10-CM | POA: Diagnosis not present

## 2022-04-25 DIAGNOSIS — I6930 Unspecified sequelae of cerebral infarction: Secondary | ICD-10-CM | POA: Diagnosis not present

## 2022-04-25 DIAGNOSIS — E785 Hyperlipidemia, unspecified: Secondary | ICD-10-CM | POA: Diagnosis not present

## 2022-04-25 DIAGNOSIS — R1311 Dysphagia, oral phase: Secondary | ICD-10-CM | POA: Diagnosis not present

## 2022-04-25 DIAGNOSIS — I10 Essential (primary) hypertension: Secondary | ICD-10-CM | POA: Diagnosis not present

## 2022-04-25 DIAGNOSIS — J9601 Acute respiratory failure with hypoxia: Secondary | ICD-10-CM | POA: Diagnosis not present

## 2022-04-25 DIAGNOSIS — R2689 Other abnormalities of gait and mobility: Secondary | ICD-10-CM | POA: Diagnosis not present

## 2022-04-25 DIAGNOSIS — Z950 Presence of cardiac pacemaker: Secondary | ICD-10-CM | POA: Diagnosis not present

## 2022-04-25 DIAGNOSIS — M6259 Muscle wasting and atrophy, not elsewhere classified, multiple sites: Secondary | ICD-10-CM | POA: Diagnosis not present

## 2022-04-25 DIAGNOSIS — E46 Unspecified protein-calorie malnutrition: Secondary | ICD-10-CM | POA: Diagnosis not present

## 2022-04-25 DIAGNOSIS — M6281 Muscle weakness (generalized): Secondary | ICD-10-CM | POA: Diagnosis not present

## 2022-04-25 DIAGNOSIS — R269 Unspecified abnormalities of gait and mobility: Secondary | ICD-10-CM | POA: Diagnosis not present

## 2022-04-25 DIAGNOSIS — E039 Hypothyroidism, unspecified: Secondary | ICD-10-CM | POA: Diagnosis not present

## 2022-04-27 DIAGNOSIS — I6930 Unspecified sequelae of cerebral infarction: Secondary | ICD-10-CM | POA: Diagnosis not present

## 2022-04-27 DIAGNOSIS — M6259 Muscle wasting and atrophy, not elsewhere classified, multiple sites: Secondary | ICD-10-CM | POA: Diagnosis not present

## 2022-04-27 DIAGNOSIS — M6281 Muscle weakness (generalized): Secondary | ICD-10-CM | POA: Diagnosis not present

## 2022-04-27 DIAGNOSIS — E785 Hyperlipidemia, unspecified: Secondary | ICD-10-CM | POA: Diagnosis not present

## 2022-04-27 DIAGNOSIS — E039 Hypothyroidism, unspecified: Secondary | ICD-10-CM | POA: Diagnosis not present

## 2022-04-27 DIAGNOSIS — Z950 Presence of cardiac pacemaker: Secondary | ICD-10-CM | POA: Diagnosis not present

## 2022-04-27 DIAGNOSIS — R2689 Other abnormalities of gait and mobility: Secondary | ICD-10-CM | POA: Diagnosis not present

## 2022-04-27 DIAGNOSIS — R269 Unspecified abnormalities of gait and mobility: Secondary | ICD-10-CM | POA: Diagnosis not present

## 2022-04-27 DIAGNOSIS — J9601 Acute respiratory failure with hypoxia: Secondary | ICD-10-CM | POA: Diagnosis not present

## 2022-04-27 DIAGNOSIS — E46 Unspecified protein-calorie malnutrition: Secondary | ICD-10-CM | POA: Diagnosis not present

## 2022-04-27 DIAGNOSIS — R1311 Dysphagia, oral phase: Secondary | ICD-10-CM | POA: Diagnosis not present

## 2022-04-27 DIAGNOSIS — I10 Essential (primary) hypertension: Secondary | ICD-10-CM | POA: Diagnosis not present

## 2022-04-28 DIAGNOSIS — E46 Unspecified protein-calorie malnutrition: Secondary | ICD-10-CM | POA: Diagnosis not present

## 2022-04-28 DIAGNOSIS — M6281 Muscle weakness (generalized): Secondary | ICD-10-CM | POA: Diagnosis not present

## 2022-04-28 DIAGNOSIS — J9601 Acute respiratory failure with hypoxia: Secondary | ICD-10-CM | POA: Diagnosis not present

## 2022-04-28 DIAGNOSIS — E785 Hyperlipidemia, unspecified: Secondary | ICD-10-CM | POA: Diagnosis not present

## 2022-04-28 DIAGNOSIS — R1311 Dysphagia, oral phase: Secondary | ICD-10-CM | POA: Diagnosis not present

## 2022-04-28 DIAGNOSIS — Z950 Presence of cardiac pacemaker: Secondary | ICD-10-CM | POA: Diagnosis not present

## 2022-04-28 DIAGNOSIS — R269 Unspecified abnormalities of gait and mobility: Secondary | ICD-10-CM | POA: Diagnosis not present

## 2022-04-28 DIAGNOSIS — R2689 Other abnormalities of gait and mobility: Secondary | ICD-10-CM | POA: Diagnosis not present

## 2022-04-28 DIAGNOSIS — M6259 Muscle wasting and atrophy, not elsewhere classified, multiple sites: Secondary | ICD-10-CM | POA: Diagnosis not present

## 2022-04-28 DIAGNOSIS — E039 Hypothyroidism, unspecified: Secondary | ICD-10-CM | POA: Diagnosis not present

## 2022-04-28 DIAGNOSIS — I6930 Unspecified sequelae of cerebral infarction: Secondary | ICD-10-CM | POA: Diagnosis not present

## 2022-04-28 DIAGNOSIS — I10 Essential (primary) hypertension: Secondary | ICD-10-CM | POA: Diagnosis not present

## 2022-04-29 DIAGNOSIS — M6259 Muscle wasting and atrophy, not elsewhere classified, multiple sites: Secondary | ICD-10-CM | POA: Diagnosis not present

## 2022-04-29 DIAGNOSIS — E039 Hypothyroidism, unspecified: Secondary | ICD-10-CM | POA: Diagnosis not present

## 2022-04-29 DIAGNOSIS — R2689 Other abnormalities of gait and mobility: Secondary | ICD-10-CM | POA: Diagnosis not present

## 2022-04-29 DIAGNOSIS — I10 Essential (primary) hypertension: Secondary | ICD-10-CM | POA: Diagnosis not present

## 2022-04-29 DIAGNOSIS — R1311 Dysphagia, oral phase: Secondary | ICD-10-CM | POA: Diagnosis not present

## 2022-04-29 DIAGNOSIS — R269 Unspecified abnormalities of gait and mobility: Secondary | ICD-10-CM | POA: Diagnosis not present

## 2022-04-29 DIAGNOSIS — Z950 Presence of cardiac pacemaker: Secondary | ICD-10-CM | POA: Diagnosis not present

## 2022-04-29 DIAGNOSIS — M6281 Muscle weakness (generalized): Secondary | ICD-10-CM | POA: Diagnosis not present

## 2022-04-29 DIAGNOSIS — E46 Unspecified protein-calorie malnutrition: Secondary | ICD-10-CM | POA: Diagnosis not present

## 2022-04-29 DIAGNOSIS — I6930 Unspecified sequelae of cerebral infarction: Secondary | ICD-10-CM | POA: Diagnosis not present

## 2022-04-29 DIAGNOSIS — J9601 Acute respiratory failure with hypoxia: Secondary | ICD-10-CM | POA: Diagnosis not present

## 2022-04-29 DIAGNOSIS — E785 Hyperlipidemia, unspecified: Secondary | ICD-10-CM | POA: Diagnosis not present

## 2022-04-30 DIAGNOSIS — R2689 Other abnormalities of gait and mobility: Secondary | ICD-10-CM | POA: Diagnosis not present

## 2022-04-30 DIAGNOSIS — E785 Hyperlipidemia, unspecified: Secondary | ICD-10-CM | POA: Diagnosis not present

## 2022-04-30 DIAGNOSIS — I6930 Unspecified sequelae of cerebral infarction: Secondary | ICD-10-CM | POA: Diagnosis not present

## 2022-04-30 DIAGNOSIS — M6259 Muscle wasting and atrophy, not elsewhere classified, multiple sites: Secondary | ICD-10-CM | POA: Diagnosis not present

## 2022-04-30 DIAGNOSIS — E039 Hypothyroidism, unspecified: Secondary | ICD-10-CM | POA: Diagnosis not present

## 2022-04-30 DIAGNOSIS — E46 Unspecified protein-calorie malnutrition: Secondary | ICD-10-CM | POA: Diagnosis not present

## 2022-04-30 DIAGNOSIS — Z950 Presence of cardiac pacemaker: Secondary | ICD-10-CM | POA: Diagnosis not present

## 2022-04-30 DIAGNOSIS — J9601 Acute respiratory failure with hypoxia: Secondary | ICD-10-CM | POA: Diagnosis not present

## 2022-04-30 DIAGNOSIS — I10 Essential (primary) hypertension: Secondary | ICD-10-CM | POA: Diagnosis not present

## 2022-04-30 DIAGNOSIS — R1311 Dysphagia, oral phase: Secondary | ICD-10-CM | POA: Diagnosis not present

## 2022-04-30 DIAGNOSIS — M6281 Muscle weakness (generalized): Secondary | ICD-10-CM | POA: Diagnosis not present

## 2022-04-30 DIAGNOSIS — R269 Unspecified abnormalities of gait and mobility: Secondary | ICD-10-CM | POA: Diagnosis not present

## 2022-05-01 DIAGNOSIS — M6281 Muscle weakness (generalized): Secondary | ICD-10-CM | POA: Diagnosis not present

## 2022-05-01 DIAGNOSIS — R1311 Dysphagia, oral phase: Secondary | ICD-10-CM | POA: Diagnosis not present

## 2022-05-01 DIAGNOSIS — J9601 Acute respiratory failure with hypoxia: Secondary | ICD-10-CM | POA: Diagnosis not present

## 2022-05-01 DIAGNOSIS — I6930 Unspecified sequelae of cerebral infarction: Secondary | ICD-10-CM | POA: Diagnosis not present

## 2022-05-01 DIAGNOSIS — M6259 Muscle wasting and atrophy, not elsewhere classified, multiple sites: Secondary | ICD-10-CM | POA: Diagnosis not present

## 2022-05-01 DIAGNOSIS — E46 Unspecified protein-calorie malnutrition: Secondary | ICD-10-CM | POA: Diagnosis not present

## 2022-05-01 DIAGNOSIS — Z950 Presence of cardiac pacemaker: Secondary | ICD-10-CM | POA: Diagnosis not present

## 2022-05-01 DIAGNOSIS — E785 Hyperlipidemia, unspecified: Secondary | ICD-10-CM | POA: Diagnosis not present

## 2022-05-01 DIAGNOSIS — R269 Unspecified abnormalities of gait and mobility: Secondary | ICD-10-CM | POA: Diagnosis not present

## 2022-05-01 DIAGNOSIS — E039 Hypothyroidism, unspecified: Secondary | ICD-10-CM | POA: Diagnosis not present

## 2022-05-01 DIAGNOSIS — I10 Essential (primary) hypertension: Secondary | ICD-10-CM | POA: Diagnosis not present

## 2022-05-01 DIAGNOSIS — R2689 Other abnormalities of gait and mobility: Secondary | ICD-10-CM | POA: Diagnosis not present

## 2022-05-04 DIAGNOSIS — E039 Hypothyroidism, unspecified: Secondary | ICD-10-CM | POA: Diagnosis not present

## 2022-05-04 DIAGNOSIS — I6930 Unspecified sequelae of cerebral infarction: Secondary | ICD-10-CM | POA: Diagnosis not present

## 2022-05-04 DIAGNOSIS — E46 Unspecified protein-calorie malnutrition: Secondary | ICD-10-CM | POA: Diagnosis not present

## 2022-05-04 DIAGNOSIS — M6281 Muscle weakness (generalized): Secondary | ICD-10-CM | POA: Diagnosis not present

## 2022-05-04 DIAGNOSIS — R1311 Dysphagia, oral phase: Secondary | ICD-10-CM | POA: Diagnosis not present

## 2022-05-04 DIAGNOSIS — R269 Unspecified abnormalities of gait and mobility: Secondary | ICD-10-CM | POA: Diagnosis not present

## 2022-05-04 DIAGNOSIS — I10 Essential (primary) hypertension: Secondary | ICD-10-CM | POA: Diagnosis not present

## 2022-05-04 DIAGNOSIS — R2689 Other abnormalities of gait and mobility: Secondary | ICD-10-CM | POA: Diagnosis not present

## 2022-05-04 DIAGNOSIS — Z950 Presence of cardiac pacemaker: Secondary | ICD-10-CM | POA: Diagnosis not present

## 2022-05-04 DIAGNOSIS — M6259 Muscle wasting and atrophy, not elsewhere classified, multiple sites: Secondary | ICD-10-CM | POA: Diagnosis not present

## 2022-05-04 DIAGNOSIS — E785 Hyperlipidemia, unspecified: Secondary | ICD-10-CM | POA: Diagnosis not present

## 2022-05-04 DIAGNOSIS — J9601 Acute respiratory failure with hypoxia: Secondary | ICD-10-CM | POA: Diagnosis not present

## 2022-05-05 DIAGNOSIS — I10 Essential (primary) hypertension: Secondary | ICD-10-CM | POA: Diagnosis not present

## 2022-05-05 DIAGNOSIS — I6930 Unspecified sequelae of cerebral infarction: Secondary | ICD-10-CM | POA: Diagnosis not present

## 2022-05-05 DIAGNOSIS — M6281 Muscle weakness (generalized): Secondary | ICD-10-CM | POA: Diagnosis not present

## 2022-05-05 DIAGNOSIS — F01511 Vascular dementia, unspecified severity, with agitation: Secondary | ICD-10-CM | POA: Diagnosis not present

## 2022-05-05 DIAGNOSIS — I69311 Memory deficit following cerebral infarction: Secondary | ICD-10-CM | POA: Diagnosis not present

## 2022-05-05 DIAGNOSIS — R269 Unspecified abnormalities of gait and mobility: Secondary | ICD-10-CM | POA: Diagnosis not present

## 2022-05-05 DIAGNOSIS — E46 Unspecified protein-calorie malnutrition: Secondary | ICD-10-CM | POA: Diagnosis not present

## 2022-05-05 DIAGNOSIS — R1311 Dysphagia, oral phase: Secondary | ICD-10-CM | POA: Diagnosis not present

## 2022-05-05 DIAGNOSIS — R451 Restlessness and agitation: Secondary | ICD-10-CM | POA: Diagnosis not present

## 2022-05-05 DIAGNOSIS — E785 Hyperlipidemia, unspecified: Secondary | ICD-10-CM | POA: Diagnosis not present

## 2022-05-05 DIAGNOSIS — J9601 Acute respiratory failure with hypoxia: Secondary | ICD-10-CM | POA: Diagnosis not present

## 2022-05-05 DIAGNOSIS — E039 Hypothyroidism, unspecified: Secondary | ICD-10-CM | POA: Diagnosis not present

## 2022-05-05 DIAGNOSIS — M6259 Muscle wasting and atrophy, not elsewhere classified, multiple sites: Secondary | ICD-10-CM | POA: Diagnosis not present

## 2022-05-05 DIAGNOSIS — R2689 Other abnormalities of gait and mobility: Secondary | ICD-10-CM | POA: Diagnosis not present

## 2022-05-05 DIAGNOSIS — Z950 Presence of cardiac pacemaker: Secondary | ICD-10-CM | POA: Diagnosis not present

## 2022-05-06 DIAGNOSIS — J9601 Acute respiratory failure with hypoxia: Secondary | ICD-10-CM | POA: Diagnosis not present

## 2022-05-06 DIAGNOSIS — R2689 Other abnormalities of gait and mobility: Secondary | ICD-10-CM | POA: Diagnosis not present

## 2022-05-06 DIAGNOSIS — Z950 Presence of cardiac pacemaker: Secondary | ICD-10-CM | POA: Diagnosis not present

## 2022-05-06 DIAGNOSIS — M6259 Muscle wasting and atrophy, not elsewhere classified, multiple sites: Secondary | ICD-10-CM | POA: Diagnosis not present

## 2022-05-06 DIAGNOSIS — M6281 Muscle weakness (generalized): Secondary | ICD-10-CM | POA: Diagnosis not present

## 2022-05-06 DIAGNOSIS — E46 Unspecified protein-calorie malnutrition: Secondary | ICD-10-CM | POA: Diagnosis not present

## 2022-05-06 DIAGNOSIS — R1311 Dysphagia, oral phase: Secondary | ICD-10-CM | POA: Diagnosis not present

## 2022-05-06 DIAGNOSIS — E039 Hypothyroidism, unspecified: Secondary | ICD-10-CM | POA: Diagnosis not present

## 2022-05-06 DIAGNOSIS — E785 Hyperlipidemia, unspecified: Secondary | ICD-10-CM | POA: Diagnosis not present

## 2022-05-06 DIAGNOSIS — I6930 Unspecified sequelae of cerebral infarction: Secondary | ICD-10-CM | POA: Diagnosis not present

## 2022-05-06 DIAGNOSIS — I10 Essential (primary) hypertension: Secondary | ICD-10-CM | POA: Diagnosis not present

## 2022-05-06 DIAGNOSIS — R269 Unspecified abnormalities of gait and mobility: Secondary | ICD-10-CM | POA: Diagnosis not present

## 2022-05-07 DIAGNOSIS — E785 Hyperlipidemia, unspecified: Secondary | ICD-10-CM | POA: Diagnosis not present

## 2022-05-07 DIAGNOSIS — E46 Unspecified protein-calorie malnutrition: Secondary | ICD-10-CM | POA: Diagnosis not present

## 2022-05-07 DIAGNOSIS — M6281 Muscle weakness (generalized): Secondary | ICD-10-CM | POA: Diagnosis not present

## 2022-05-07 DIAGNOSIS — J9601 Acute respiratory failure with hypoxia: Secondary | ICD-10-CM | POA: Diagnosis not present

## 2022-05-07 DIAGNOSIS — R2689 Other abnormalities of gait and mobility: Secondary | ICD-10-CM | POA: Diagnosis not present

## 2022-05-07 DIAGNOSIS — E039 Hypothyroidism, unspecified: Secondary | ICD-10-CM | POA: Diagnosis not present

## 2022-05-07 DIAGNOSIS — Z950 Presence of cardiac pacemaker: Secondary | ICD-10-CM | POA: Diagnosis not present

## 2022-05-07 DIAGNOSIS — M6259 Muscle wasting and atrophy, not elsewhere classified, multiple sites: Secondary | ICD-10-CM | POA: Diagnosis not present

## 2022-05-07 DIAGNOSIS — R269 Unspecified abnormalities of gait and mobility: Secondary | ICD-10-CM | POA: Diagnosis not present

## 2022-05-07 DIAGNOSIS — I10 Essential (primary) hypertension: Secondary | ICD-10-CM | POA: Diagnosis not present

## 2022-05-07 DIAGNOSIS — R1311 Dysphagia, oral phase: Secondary | ICD-10-CM | POA: Diagnosis not present

## 2022-05-07 DIAGNOSIS — I6930 Unspecified sequelae of cerebral infarction: Secondary | ICD-10-CM | POA: Diagnosis not present

## 2022-05-13 DIAGNOSIS — R269 Unspecified abnormalities of gait and mobility: Secondary | ICD-10-CM | POA: Diagnosis not present

## 2022-05-13 DIAGNOSIS — E46 Unspecified protein-calorie malnutrition: Secondary | ICD-10-CM | POA: Diagnosis not present

## 2022-05-13 DIAGNOSIS — R2689 Other abnormalities of gait and mobility: Secondary | ICD-10-CM | POA: Diagnosis not present

## 2022-05-13 DIAGNOSIS — R1311 Dysphagia, oral phase: Secondary | ICD-10-CM | POA: Diagnosis not present

## 2022-05-13 DIAGNOSIS — M6259 Muscle wasting and atrophy, not elsewhere classified, multiple sites: Secondary | ICD-10-CM | POA: Diagnosis not present

## 2022-05-13 DIAGNOSIS — I10 Essential (primary) hypertension: Secondary | ICD-10-CM | POA: Diagnosis not present

## 2022-05-13 DIAGNOSIS — E785 Hyperlipidemia, unspecified: Secondary | ICD-10-CM | POA: Diagnosis not present

## 2022-05-13 DIAGNOSIS — J9601 Acute respiratory failure with hypoxia: Secondary | ICD-10-CM | POA: Diagnosis not present

## 2022-05-13 DIAGNOSIS — M6281 Muscle weakness (generalized): Secondary | ICD-10-CM | POA: Diagnosis not present

## 2022-05-13 DIAGNOSIS — E039 Hypothyroidism, unspecified: Secondary | ICD-10-CM | POA: Diagnosis not present

## 2022-05-13 DIAGNOSIS — I6930 Unspecified sequelae of cerebral infarction: Secondary | ICD-10-CM | POA: Diagnosis not present

## 2022-05-13 DIAGNOSIS — Z950 Presence of cardiac pacemaker: Secondary | ICD-10-CM | POA: Diagnosis not present

## 2022-05-14 DIAGNOSIS — R2689 Other abnormalities of gait and mobility: Secondary | ICD-10-CM | POA: Diagnosis not present

## 2022-05-14 DIAGNOSIS — Z950 Presence of cardiac pacemaker: Secondary | ICD-10-CM | POA: Diagnosis not present

## 2022-05-14 DIAGNOSIS — E039 Hypothyroidism, unspecified: Secondary | ICD-10-CM | POA: Diagnosis not present

## 2022-05-14 DIAGNOSIS — M6259 Muscle wasting and atrophy, not elsewhere classified, multiple sites: Secondary | ICD-10-CM | POA: Diagnosis not present

## 2022-05-14 DIAGNOSIS — E46 Unspecified protein-calorie malnutrition: Secondary | ICD-10-CM | POA: Diagnosis not present

## 2022-05-14 DIAGNOSIS — I10 Essential (primary) hypertension: Secondary | ICD-10-CM | POA: Diagnosis not present

## 2022-05-14 DIAGNOSIS — J9601 Acute respiratory failure with hypoxia: Secondary | ICD-10-CM | POA: Diagnosis not present

## 2022-05-14 DIAGNOSIS — M6281 Muscle weakness (generalized): Secondary | ICD-10-CM | POA: Diagnosis not present

## 2022-05-14 DIAGNOSIS — E785 Hyperlipidemia, unspecified: Secondary | ICD-10-CM | POA: Diagnosis not present

## 2022-05-14 DIAGNOSIS — I6930 Unspecified sequelae of cerebral infarction: Secondary | ICD-10-CM | POA: Diagnosis not present

## 2022-05-14 DIAGNOSIS — R1311 Dysphagia, oral phase: Secondary | ICD-10-CM | POA: Diagnosis not present

## 2022-05-14 DIAGNOSIS — R269 Unspecified abnormalities of gait and mobility: Secondary | ICD-10-CM | POA: Diagnosis not present

## 2022-05-15 DIAGNOSIS — R2689 Other abnormalities of gait and mobility: Secondary | ICD-10-CM | POA: Diagnosis not present

## 2022-05-15 DIAGNOSIS — E785 Hyperlipidemia, unspecified: Secondary | ICD-10-CM | POA: Diagnosis not present

## 2022-05-15 DIAGNOSIS — I10 Essential (primary) hypertension: Secondary | ICD-10-CM | POA: Diagnosis not present

## 2022-05-15 DIAGNOSIS — M6259 Muscle wasting and atrophy, not elsewhere classified, multiple sites: Secondary | ICD-10-CM | POA: Diagnosis not present

## 2022-05-15 DIAGNOSIS — I6930 Unspecified sequelae of cerebral infarction: Secondary | ICD-10-CM | POA: Diagnosis not present

## 2022-05-15 DIAGNOSIS — Z950 Presence of cardiac pacemaker: Secondary | ICD-10-CM | POA: Diagnosis not present

## 2022-05-15 DIAGNOSIS — E46 Unspecified protein-calorie malnutrition: Secondary | ICD-10-CM | POA: Diagnosis not present

## 2022-05-15 DIAGNOSIS — M6281 Muscle weakness (generalized): Secondary | ICD-10-CM | POA: Diagnosis not present

## 2022-05-15 DIAGNOSIS — E039 Hypothyroidism, unspecified: Secondary | ICD-10-CM | POA: Diagnosis not present

## 2022-05-15 DIAGNOSIS — J9601 Acute respiratory failure with hypoxia: Secondary | ICD-10-CM | POA: Diagnosis not present

## 2022-05-15 DIAGNOSIS — R1311 Dysphagia, oral phase: Secondary | ICD-10-CM | POA: Diagnosis not present

## 2022-05-15 DIAGNOSIS — R269 Unspecified abnormalities of gait and mobility: Secondary | ICD-10-CM | POA: Diagnosis not present

## 2022-05-19 DIAGNOSIS — E039 Hypothyroidism, unspecified: Secondary | ICD-10-CM | POA: Diagnosis not present

## 2022-05-19 DIAGNOSIS — Z950 Presence of cardiac pacemaker: Secondary | ICD-10-CM | POA: Diagnosis not present

## 2022-05-19 DIAGNOSIS — I10 Essential (primary) hypertension: Secondary | ICD-10-CM | POA: Diagnosis not present

## 2022-05-19 DIAGNOSIS — R2689 Other abnormalities of gait and mobility: Secondary | ICD-10-CM | POA: Diagnosis not present

## 2022-05-19 DIAGNOSIS — E46 Unspecified protein-calorie malnutrition: Secondary | ICD-10-CM | POA: Diagnosis not present

## 2022-05-19 DIAGNOSIS — I6930 Unspecified sequelae of cerebral infarction: Secondary | ICD-10-CM | POA: Diagnosis not present

## 2022-05-19 DIAGNOSIS — E785 Hyperlipidemia, unspecified: Secondary | ICD-10-CM | POA: Diagnosis not present

## 2022-05-19 DIAGNOSIS — M6281 Muscle weakness (generalized): Secondary | ICD-10-CM | POA: Diagnosis not present

## 2022-05-19 DIAGNOSIS — J9601 Acute respiratory failure with hypoxia: Secondary | ICD-10-CM | POA: Diagnosis not present

## 2022-05-19 DIAGNOSIS — M6259 Muscle wasting and atrophy, not elsewhere classified, multiple sites: Secondary | ICD-10-CM | POA: Diagnosis not present

## 2022-05-19 DIAGNOSIS — R269 Unspecified abnormalities of gait and mobility: Secondary | ICD-10-CM | POA: Diagnosis not present

## 2022-05-19 DIAGNOSIS — R1311 Dysphagia, oral phase: Secondary | ICD-10-CM | POA: Diagnosis not present

## 2022-05-20 DIAGNOSIS — Z950 Presence of cardiac pacemaker: Secondary | ICD-10-CM | POA: Diagnosis not present

## 2022-05-20 DIAGNOSIS — M6281 Muscle weakness (generalized): Secondary | ICD-10-CM | POA: Diagnosis not present

## 2022-05-20 DIAGNOSIS — E46 Unspecified protein-calorie malnutrition: Secondary | ICD-10-CM | POA: Diagnosis not present

## 2022-05-20 DIAGNOSIS — I10 Essential (primary) hypertension: Secondary | ICD-10-CM | POA: Diagnosis not present

## 2022-05-20 DIAGNOSIS — E785 Hyperlipidemia, unspecified: Secondary | ICD-10-CM | POA: Diagnosis not present

## 2022-05-20 DIAGNOSIS — E039 Hypothyroidism, unspecified: Secondary | ICD-10-CM | POA: Diagnosis not present

## 2022-05-20 DIAGNOSIS — R1311 Dysphagia, oral phase: Secondary | ICD-10-CM | POA: Diagnosis not present

## 2022-05-20 DIAGNOSIS — R2689 Other abnormalities of gait and mobility: Secondary | ICD-10-CM | POA: Diagnosis not present

## 2022-05-20 DIAGNOSIS — R269 Unspecified abnormalities of gait and mobility: Secondary | ICD-10-CM | POA: Diagnosis not present

## 2022-05-20 DIAGNOSIS — I6930 Unspecified sequelae of cerebral infarction: Secondary | ICD-10-CM | POA: Diagnosis not present

## 2022-05-20 DIAGNOSIS — M6259 Muscle wasting and atrophy, not elsewhere classified, multiple sites: Secondary | ICD-10-CM | POA: Diagnosis not present

## 2022-05-20 DIAGNOSIS — J9601 Acute respiratory failure with hypoxia: Secondary | ICD-10-CM | POA: Diagnosis not present

## 2022-05-25 DIAGNOSIS — J9601 Acute respiratory failure with hypoxia: Secondary | ICD-10-CM | POA: Diagnosis not present

## 2022-05-25 DIAGNOSIS — I6930 Unspecified sequelae of cerebral infarction: Secondary | ICD-10-CM | POA: Diagnosis not present

## 2022-05-25 DIAGNOSIS — I10 Essential (primary) hypertension: Secondary | ICD-10-CM | POA: Diagnosis not present

## 2022-05-25 DIAGNOSIS — R269 Unspecified abnormalities of gait and mobility: Secondary | ICD-10-CM | POA: Diagnosis not present

## 2022-05-25 DIAGNOSIS — E785 Hyperlipidemia, unspecified: Secondary | ICD-10-CM | POA: Diagnosis not present

## 2022-05-25 DIAGNOSIS — R2689 Other abnormalities of gait and mobility: Secondary | ICD-10-CM | POA: Diagnosis not present

## 2022-05-25 DIAGNOSIS — M6281 Muscle weakness (generalized): Secondary | ICD-10-CM | POA: Diagnosis not present

## 2022-05-25 DIAGNOSIS — E039 Hypothyroidism, unspecified: Secondary | ICD-10-CM | POA: Diagnosis not present

## 2022-05-25 DIAGNOSIS — M6259 Muscle wasting and atrophy, not elsewhere classified, multiple sites: Secondary | ICD-10-CM | POA: Diagnosis not present

## 2022-05-25 DIAGNOSIS — Z950 Presence of cardiac pacemaker: Secondary | ICD-10-CM | POA: Diagnosis not present

## 2022-05-25 DIAGNOSIS — R1311 Dysphagia, oral phase: Secondary | ICD-10-CM | POA: Diagnosis not present

## 2022-05-25 DIAGNOSIS — E46 Unspecified protein-calorie malnutrition: Secondary | ICD-10-CM | POA: Diagnosis not present

## 2022-05-26 DIAGNOSIS — F03918 Unspecified dementia, unspecified severity, with other behavioral disturbance: Secondary | ICD-10-CM | POA: Diagnosis not present

## 2022-05-26 DIAGNOSIS — M6259 Muscle wasting and atrophy, not elsewhere classified, multiple sites: Secondary | ICD-10-CM | POA: Diagnosis not present

## 2022-05-26 DIAGNOSIS — J9601 Acute respiratory failure with hypoxia: Secondary | ICD-10-CM | POA: Diagnosis not present

## 2022-05-26 DIAGNOSIS — I119 Hypertensive heart disease without heart failure: Secondary | ICD-10-CM | POA: Diagnosis not present

## 2022-05-26 DIAGNOSIS — M1990 Unspecified osteoarthritis, unspecified site: Secondary | ICD-10-CM | POA: Diagnosis not present

## 2022-05-26 DIAGNOSIS — I6930 Unspecified sequelae of cerebral infarction: Secondary | ICD-10-CM | POA: Diagnosis not present

## 2022-05-26 DIAGNOSIS — R2689 Other abnormalities of gait and mobility: Secondary | ICD-10-CM | POA: Diagnosis not present

## 2022-05-26 DIAGNOSIS — R1311 Dysphagia, oral phase: Secondary | ICD-10-CM | POA: Diagnosis not present

## 2022-05-26 DIAGNOSIS — E039 Hypothyroidism, unspecified: Secondary | ICD-10-CM | POA: Diagnosis not present

## 2022-05-26 DIAGNOSIS — E46 Unspecified protein-calorie malnutrition: Secondary | ICD-10-CM | POA: Diagnosis not present

## 2022-05-26 DIAGNOSIS — Z950 Presence of cardiac pacemaker: Secondary | ICD-10-CM | POA: Diagnosis not present

## 2022-05-26 DIAGNOSIS — I10 Essential (primary) hypertension: Secondary | ICD-10-CM | POA: Diagnosis not present

## 2022-05-26 DIAGNOSIS — R269 Unspecified abnormalities of gait and mobility: Secondary | ICD-10-CM | POA: Diagnosis not present

## 2022-05-26 DIAGNOSIS — E785 Hyperlipidemia, unspecified: Secondary | ICD-10-CM | POA: Diagnosis not present

## 2022-05-26 DIAGNOSIS — M6281 Muscle weakness (generalized): Secondary | ICD-10-CM | POA: Diagnosis not present

## 2022-05-28 DIAGNOSIS — E785 Hyperlipidemia, unspecified: Secondary | ICD-10-CM | POA: Diagnosis not present

## 2022-05-28 DIAGNOSIS — R1311 Dysphagia, oral phase: Secondary | ICD-10-CM | POA: Diagnosis not present

## 2022-05-28 DIAGNOSIS — M6259 Muscle wasting and atrophy, not elsewhere classified, multiple sites: Secondary | ICD-10-CM | POA: Diagnosis not present

## 2022-05-28 DIAGNOSIS — I6930 Unspecified sequelae of cerebral infarction: Secondary | ICD-10-CM | POA: Diagnosis not present

## 2022-05-28 DIAGNOSIS — Z950 Presence of cardiac pacemaker: Secondary | ICD-10-CM | POA: Diagnosis not present

## 2022-05-28 DIAGNOSIS — M6281 Muscle weakness (generalized): Secondary | ICD-10-CM | POA: Diagnosis not present

## 2022-05-28 DIAGNOSIS — I10 Essential (primary) hypertension: Secondary | ICD-10-CM | POA: Diagnosis not present

## 2022-05-28 DIAGNOSIS — E039 Hypothyroidism, unspecified: Secondary | ICD-10-CM | POA: Diagnosis not present

## 2022-05-28 DIAGNOSIS — J9601 Acute respiratory failure with hypoxia: Secondary | ICD-10-CM | POA: Diagnosis not present

## 2022-05-28 DIAGNOSIS — R2689 Other abnormalities of gait and mobility: Secondary | ICD-10-CM | POA: Diagnosis not present

## 2022-05-28 DIAGNOSIS — E46 Unspecified protein-calorie malnutrition: Secondary | ICD-10-CM | POA: Diagnosis not present

## 2022-05-28 DIAGNOSIS — R269 Unspecified abnormalities of gait and mobility: Secondary | ICD-10-CM | POA: Diagnosis not present

## 2022-05-31 DIAGNOSIS — Z45010 Encounter for checking and testing of cardiac pacemaker pulse generator [battery]: Secondary | ICD-10-CM | POA: Diagnosis not present

## 2022-05-31 DIAGNOSIS — R55 Syncope and collapse: Secondary | ICD-10-CM | POA: Diagnosis not present

## 2022-06-01 DIAGNOSIS — E039 Hypothyroidism, unspecified: Secondary | ICD-10-CM | POA: Diagnosis not present

## 2022-06-01 DIAGNOSIS — E46 Unspecified protein-calorie malnutrition: Secondary | ICD-10-CM | POA: Diagnosis not present

## 2022-06-01 DIAGNOSIS — R1311 Dysphagia, oral phase: Secondary | ICD-10-CM | POA: Diagnosis not present

## 2022-06-01 DIAGNOSIS — Z950 Presence of cardiac pacemaker: Secondary | ICD-10-CM | POA: Diagnosis not present

## 2022-06-01 DIAGNOSIS — I6930 Unspecified sequelae of cerebral infarction: Secondary | ICD-10-CM | POA: Diagnosis not present

## 2022-06-01 DIAGNOSIS — M6281 Muscle weakness (generalized): Secondary | ICD-10-CM | POA: Diagnosis not present

## 2022-06-01 DIAGNOSIS — M6259 Muscle wasting and atrophy, not elsewhere classified, multiple sites: Secondary | ICD-10-CM | POA: Diagnosis not present

## 2022-06-01 DIAGNOSIS — E785 Hyperlipidemia, unspecified: Secondary | ICD-10-CM | POA: Diagnosis not present

## 2022-06-01 DIAGNOSIS — R2689 Other abnormalities of gait and mobility: Secondary | ICD-10-CM | POA: Diagnosis not present

## 2022-06-01 DIAGNOSIS — R269 Unspecified abnormalities of gait and mobility: Secondary | ICD-10-CM | POA: Diagnosis not present

## 2022-06-01 DIAGNOSIS — J9601 Acute respiratory failure with hypoxia: Secondary | ICD-10-CM | POA: Diagnosis not present

## 2022-06-01 DIAGNOSIS — I10 Essential (primary) hypertension: Secondary | ICD-10-CM | POA: Diagnosis not present

## 2022-06-02 DIAGNOSIS — Z950 Presence of cardiac pacemaker: Secondary | ICD-10-CM | POA: Diagnosis not present

## 2022-06-02 DIAGNOSIS — R1311 Dysphagia, oral phase: Secondary | ICD-10-CM | POA: Diagnosis not present

## 2022-06-02 DIAGNOSIS — M6259 Muscle wasting and atrophy, not elsewhere classified, multiple sites: Secondary | ICD-10-CM | POA: Diagnosis not present

## 2022-06-02 DIAGNOSIS — M6281 Muscle weakness (generalized): Secondary | ICD-10-CM | POA: Diagnosis not present

## 2022-06-02 DIAGNOSIS — R269 Unspecified abnormalities of gait and mobility: Secondary | ICD-10-CM | POA: Diagnosis not present

## 2022-06-02 DIAGNOSIS — R451 Restlessness and agitation: Secondary | ICD-10-CM | POA: Diagnosis not present

## 2022-06-02 DIAGNOSIS — J9601 Acute respiratory failure with hypoxia: Secondary | ICD-10-CM | POA: Diagnosis not present

## 2022-06-02 DIAGNOSIS — F01511 Vascular dementia, unspecified severity, with agitation: Secondary | ICD-10-CM | POA: Diagnosis not present

## 2022-06-02 DIAGNOSIS — I6930 Unspecified sequelae of cerebral infarction: Secondary | ICD-10-CM | POA: Diagnosis not present

## 2022-06-02 DIAGNOSIS — R2689 Other abnormalities of gait and mobility: Secondary | ICD-10-CM | POA: Diagnosis not present

## 2022-06-02 DIAGNOSIS — E039 Hypothyroidism, unspecified: Secondary | ICD-10-CM | POA: Diagnosis not present

## 2022-06-02 DIAGNOSIS — I10 Essential (primary) hypertension: Secondary | ICD-10-CM | POA: Diagnosis not present

## 2022-06-02 DIAGNOSIS — I69311 Memory deficit following cerebral infarction: Secondary | ICD-10-CM | POA: Diagnosis not present

## 2022-06-02 DIAGNOSIS — E785 Hyperlipidemia, unspecified: Secondary | ICD-10-CM | POA: Diagnosis not present

## 2022-06-02 DIAGNOSIS — E46 Unspecified protein-calorie malnutrition: Secondary | ICD-10-CM | POA: Diagnosis not present

## 2022-06-03 DIAGNOSIS — M6281 Muscle weakness (generalized): Secondary | ICD-10-CM | POA: Diagnosis not present

## 2022-06-03 DIAGNOSIS — I6930 Unspecified sequelae of cerebral infarction: Secondary | ICD-10-CM | POA: Diagnosis not present

## 2022-06-03 DIAGNOSIS — R269 Unspecified abnormalities of gait and mobility: Secondary | ICD-10-CM | POA: Diagnosis not present

## 2022-06-03 DIAGNOSIS — M6259 Muscle wasting and atrophy, not elsewhere classified, multiple sites: Secondary | ICD-10-CM | POA: Diagnosis not present

## 2022-06-03 DIAGNOSIS — Z950 Presence of cardiac pacemaker: Secondary | ICD-10-CM | POA: Diagnosis not present

## 2022-06-03 DIAGNOSIS — J9601 Acute respiratory failure with hypoxia: Secondary | ICD-10-CM | POA: Diagnosis not present

## 2022-06-03 DIAGNOSIS — E46 Unspecified protein-calorie malnutrition: Secondary | ICD-10-CM | POA: Diagnosis not present

## 2022-06-03 DIAGNOSIS — I10 Essential (primary) hypertension: Secondary | ICD-10-CM | POA: Diagnosis not present

## 2022-06-03 DIAGNOSIS — R1311 Dysphagia, oral phase: Secondary | ICD-10-CM | POA: Diagnosis not present

## 2022-06-03 DIAGNOSIS — R2689 Other abnormalities of gait and mobility: Secondary | ICD-10-CM | POA: Diagnosis not present

## 2022-06-03 DIAGNOSIS — E039 Hypothyroidism, unspecified: Secondary | ICD-10-CM | POA: Diagnosis not present

## 2022-06-03 DIAGNOSIS — E785 Hyperlipidemia, unspecified: Secondary | ICD-10-CM | POA: Diagnosis not present

## 2022-06-04 DIAGNOSIS — R269 Unspecified abnormalities of gait and mobility: Secondary | ICD-10-CM | POA: Diagnosis not present

## 2022-06-04 DIAGNOSIS — J9601 Acute respiratory failure with hypoxia: Secondary | ICD-10-CM | POA: Diagnosis not present

## 2022-06-04 DIAGNOSIS — M6259 Muscle wasting and atrophy, not elsewhere classified, multiple sites: Secondary | ICD-10-CM | POA: Diagnosis not present

## 2022-06-04 DIAGNOSIS — M6281 Muscle weakness (generalized): Secondary | ICD-10-CM | POA: Diagnosis not present

## 2022-06-04 DIAGNOSIS — I6930 Unspecified sequelae of cerebral infarction: Secondary | ICD-10-CM | POA: Diagnosis not present

## 2022-06-04 DIAGNOSIS — I10 Essential (primary) hypertension: Secondary | ICD-10-CM | POA: Diagnosis not present

## 2022-06-04 DIAGNOSIS — E46 Unspecified protein-calorie malnutrition: Secondary | ICD-10-CM | POA: Diagnosis not present

## 2022-06-04 DIAGNOSIS — E785 Hyperlipidemia, unspecified: Secondary | ICD-10-CM | POA: Diagnosis not present

## 2022-06-04 DIAGNOSIS — R1311 Dysphagia, oral phase: Secondary | ICD-10-CM | POA: Diagnosis not present

## 2022-06-04 DIAGNOSIS — Z950 Presence of cardiac pacemaker: Secondary | ICD-10-CM | POA: Diagnosis not present

## 2022-06-04 DIAGNOSIS — R2689 Other abnormalities of gait and mobility: Secondary | ICD-10-CM | POA: Diagnosis not present

## 2022-06-04 DIAGNOSIS — E039 Hypothyroidism, unspecified: Secondary | ICD-10-CM | POA: Diagnosis not present

## 2022-06-05 DIAGNOSIS — R269 Unspecified abnormalities of gait and mobility: Secondary | ICD-10-CM | POA: Diagnosis not present

## 2022-06-05 DIAGNOSIS — R2689 Other abnormalities of gait and mobility: Secondary | ICD-10-CM | POA: Diagnosis not present

## 2022-06-05 DIAGNOSIS — I6930 Unspecified sequelae of cerebral infarction: Secondary | ICD-10-CM | POA: Diagnosis not present

## 2022-06-05 DIAGNOSIS — R1311 Dysphagia, oral phase: Secondary | ICD-10-CM | POA: Diagnosis not present

## 2022-06-05 DIAGNOSIS — J9601 Acute respiratory failure with hypoxia: Secondary | ICD-10-CM | POA: Diagnosis not present

## 2022-06-05 DIAGNOSIS — E785 Hyperlipidemia, unspecified: Secondary | ICD-10-CM | POA: Diagnosis not present

## 2022-06-05 DIAGNOSIS — E46 Unspecified protein-calorie malnutrition: Secondary | ICD-10-CM | POA: Diagnosis not present

## 2022-06-05 DIAGNOSIS — M6281 Muscle weakness (generalized): Secondary | ICD-10-CM | POA: Diagnosis not present

## 2022-06-05 DIAGNOSIS — M6259 Muscle wasting and atrophy, not elsewhere classified, multiple sites: Secondary | ICD-10-CM | POA: Diagnosis not present

## 2022-06-05 DIAGNOSIS — E039 Hypothyroidism, unspecified: Secondary | ICD-10-CM | POA: Diagnosis not present

## 2022-06-05 DIAGNOSIS — I10 Essential (primary) hypertension: Secondary | ICD-10-CM | POA: Diagnosis not present

## 2022-06-05 DIAGNOSIS — Z950 Presence of cardiac pacemaker: Secondary | ICD-10-CM | POA: Diagnosis not present

## 2022-06-08 DIAGNOSIS — R269 Unspecified abnormalities of gait and mobility: Secondary | ICD-10-CM | POA: Diagnosis not present

## 2022-06-08 DIAGNOSIS — R1311 Dysphagia, oral phase: Secondary | ICD-10-CM | POA: Diagnosis not present

## 2022-06-08 DIAGNOSIS — E039 Hypothyroidism, unspecified: Secondary | ICD-10-CM | POA: Diagnosis not present

## 2022-06-08 DIAGNOSIS — I10 Essential (primary) hypertension: Secondary | ICD-10-CM | POA: Diagnosis not present

## 2022-06-08 DIAGNOSIS — R2689 Other abnormalities of gait and mobility: Secondary | ICD-10-CM | POA: Diagnosis not present

## 2022-06-08 DIAGNOSIS — J9601 Acute respiratory failure with hypoxia: Secondary | ICD-10-CM | POA: Diagnosis not present

## 2022-06-08 DIAGNOSIS — Z950 Presence of cardiac pacemaker: Secondary | ICD-10-CM | POA: Diagnosis not present

## 2022-06-08 DIAGNOSIS — E46 Unspecified protein-calorie malnutrition: Secondary | ICD-10-CM | POA: Diagnosis not present

## 2022-06-08 DIAGNOSIS — M6281 Muscle weakness (generalized): Secondary | ICD-10-CM | POA: Diagnosis not present

## 2022-06-08 DIAGNOSIS — E785 Hyperlipidemia, unspecified: Secondary | ICD-10-CM | POA: Diagnosis not present

## 2022-06-08 DIAGNOSIS — I6930 Unspecified sequelae of cerebral infarction: Secondary | ICD-10-CM | POA: Diagnosis not present

## 2022-06-08 DIAGNOSIS — M6259 Muscle wasting and atrophy, not elsewhere classified, multiple sites: Secondary | ICD-10-CM | POA: Diagnosis not present

## 2022-06-09 DIAGNOSIS — Z950 Presence of cardiac pacemaker: Secondary | ICD-10-CM | POA: Diagnosis not present

## 2022-06-09 DIAGNOSIS — R1311 Dysphagia, oral phase: Secondary | ICD-10-CM | POA: Diagnosis not present

## 2022-06-09 DIAGNOSIS — I6930 Unspecified sequelae of cerebral infarction: Secondary | ICD-10-CM | POA: Diagnosis not present

## 2022-06-09 DIAGNOSIS — M6281 Muscle weakness (generalized): Secondary | ICD-10-CM | POA: Diagnosis not present

## 2022-06-09 DIAGNOSIS — R269 Unspecified abnormalities of gait and mobility: Secondary | ICD-10-CM | POA: Diagnosis not present

## 2022-06-09 DIAGNOSIS — I10 Essential (primary) hypertension: Secondary | ICD-10-CM | POA: Diagnosis not present

## 2022-06-09 DIAGNOSIS — E039 Hypothyroidism, unspecified: Secondary | ICD-10-CM | POA: Diagnosis not present

## 2022-06-09 DIAGNOSIS — E46 Unspecified protein-calorie malnutrition: Secondary | ICD-10-CM | POA: Diagnosis not present

## 2022-06-09 DIAGNOSIS — R2689 Other abnormalities of gait and mobility: Secondary | ICD-10-CM | POA: Diagnosis not present

## 2022-06-09 DIAGNOSIS — M6259 Muscle wasting and atrophy, not elsewhere classified, multiple sites: Secondary | ICD-10-CM | POA: Diagnosis not present

## 2022-06-09 DIAGNOSIS — J9601 Acute respiratory failure with hypoxia: Secondary | ICD-10-CM | POA: Diagnosis not present

## 2022-06-09 DIAGNOSIS — E785 Hyperlipidemia, unspecified: Secondary | ICD-10-CM | POA: Diagnosis not present

## 2022-06-14 DIAGNOSIS — M6259 Muscle wasting and atrophy, not elsewhere classified, multiple sites: Secondary | ICD-10-CM | POA: Diagnosis not present

## 2022-06-14 DIAGNOSIS — R269 Unspecified abnormalities of gait and mobility: Secondary | ICD-10-CM | POA: Diagnosis not present

## 2022-06-14 DIAGNOSIS — I10 Essential (primary) hypertension: Secondary | ICD-10-CM | POA: Diagnosis not present

## 2022-06-14 DIAGNOSIS — I6930 Unspecified sequelae of cerebral infarction: Secondary | ICD-10-CM | POA: Diagnosis not present

## 2022-06-14 DIAGNOSIS — Z950 Presence of cardiac pacemaker: Secondary | ICD-10-CM | POA: Diagnosis not present

## 2022-06-14 DIAGNOSIS — E039 Hypothyroidism, unspecified: Secondary | ICD-10-CM | POA: Diagnosis not present

## 2022-06-14 DIAGNOSIS — M6281 Muscle weakness (generalized): Secondary | ICD-10-CM | POA: Diagnosis not present

## 2022-06-14 DIAGNOSIS — E785 Hyperlipidemia, unspecified: Secondary | ICD-10-CM | POA: Diagnosis not present

## 2022-06-14 DIAGNOSIS — J9601 Acute respiratory failure with hypoxia: Secondary | ICD-10-CM | POA: Diagnosis not present

## 2022-06-14 DIAGNOSIS — R1311 Dysphagia, oral phase: Secondary | ICD-10-CM | POA: Diagnosis not present

## 2022-06-14 DIAGNOSIS — E46 Unspecified protein-calorie malnutrition: Secondary | ICD-10-CM | POA: Diagnosis not present

## 2022-06-14 DIAGNOSIS — R2689 Other abnormalities of gait and mobility: Secondary | ICD-10-CM | POA: Diagnosis not present

## 2022-06-17 DIAGNOSIS — F03918 Unspecified dementia, unspecified severity, with other behavioral disturbance: Secondary | ICD-10-CM | POA: Diagnosis not present

## 2022-06-29 DIAGNOSIS — Z79899 Other long term (current) drug therapy: Secondary | ICD-10-CM | POA: Diagnosis not present

## 2022-06-30 DIAGNOSIS — E039 Hypothyroidism, unspecified: Secondary | ICD-10-CM | POA: Diagnosis not present

## 2022-06-30 DIAGNOSIS — F01511 Vascular dementia, unspecified severity, with agitation: Secondary | ICD-10-CM | POA: Diagnosis not present

## 2022-06-30 DIAGNOSIS — I69311 Memory deficit following cerebral infarction: Secondary | ICD-10-CM | POA: Diagnosis not present

## 2022-06-30 DIAGNOSIS — F03918 Unspecified dementia, unspecified severity, with other behavioral disturbance: Secondary | ICD-10-CM | POA: Diagnosis not present

## 2022-06-30 DIAGNOSIS — E785 Hyperlipidemia, unspecified: Secondary | ICD-10-CM | POA: Diagnosis not present

## 2022-06-30 DIAGNOSIS — I119 Hypertensive heart disease without heart failure: Secondary | ICD-10-CM | POA: Diagnosis not present

## 2022-06-30 DIAGNOSIS — R451 Restlessness and agitation: Secondary | ICD-10-CM | POA: Diagnosis not present

## 2022-07-02 DIAGNOSIS — Z136 Encounter for screening for cardiovascular disorders: Secondary | ICD-10-CM | POA: Diagnosis not present

## 2022-07-02 DIAGNOSIS — Z0000 Encounter for general adult medical examination without abnormal findings: Secondary | ICD-10-CM | POA: Diagnosis not present

## 2022-07-02 DIAGNOSIS — Z139 Encounter for screening, unspecified: Secondary | ICD-10-CM | POA: Diagnosis not present

## 2022-07-28 DIAGNOSIS — F01511 Vascular dementia, unspecified severity, with agitation: Secondary | ICD-10-CM | POA: Diagnosis not present

## 2022-07-28 DIAGNOSIS — I69311 Memory deficit following cerebral infarction: Secondary | ICD-10-CM | POA: Diagnosis not present

## 2022-07-30 DIAGNOSIS — E785 Hyperlipidemia, unspecified: Secondary | ICD-10-CM | POA: Diagnosis not present

## 2022-07-30 DIAGNOSIS — F03918 Unspecified dementia, unspecified severity, with other behavioral disturbance: Secondary | ICD-10-CM | POA: Diagnosis not present

## 2022-07-30 DIAGNOSIS — E039 Hypothyroidism, unspecified: Secondary | ICD-10-CM | POA: Diagnosis not present

## 2022-08-05 DIAGNOSIS — N390 Urinary tract infection, site not specified: Secondary | ICD-10-CM | POA: Diagnosis not present

## 2022-08-07 DIAGNOSIS — R5383 Other fatigue: Secondary | ICD-10-CM | POA: Diagnosis not present

## 2022-08-10 DIAGNOSIS — F03918 Unspecified dementia, unspecified severity, with other behavioral disturbance: Secondary | ICD-10-CM | POA: Diagnosis not present

## 2022-08-10 DIAGNOSIS — I700 Atherosclerosis of aorta: Secondary | ICD-10-CM | POA: Diagnosis not present

## 2022-08-10 DIAGNOSIS — Z8673 Personal history of transient ischemic attack (TIA), and cerebral infarction without residual deficits: Secondary | ICD-10-CM | POA: Diagnosis not present

## 2022-08-19 DIAGNOSIS — I495 Sick sinus syndrome: Secondary | ICD-10-CM | POA: Diagnosis not present

## 2022-08-19 DIAGNOSIS — Z45018 Encounter for adjustment and management of other part of cardiac pacemaker: Secondary | ICD-10-CM | POA: Diagnosis not present

## 2022-08-25 DIAGNOSIS — I69311 Memory deficit following cerebral infarction: Secondary | ICD-10-CM | POA: Diagnosis not present

## 2022-08-25 DIAGNOSIS — F01511 Vascular dementia, unspecified severity, with agitation: Secondary | ICD-10-CM | POA: Diagnosis not present

## 2022-09-01 DIAGNOSIS — I700 Atherosclerosis of aorta: Secondary | ICD-10-CM | POA: Diagnosis not present

## 2022-09-01 DIAGNOSIS — F03918 Unspecified dementia, unspecified severity, with other behavioral disturbance: Secondary | ICD-10-CM | POA: Diagnosis not present

## 2022-09-01 DIAGNOSIS — E039 Hypothyroidism, unspecified: Secondary | ICD-10-CM | POA: Diagnosis not present

## 2022-09-03 DIAGNOSIS — J9601 Acute respiratory failure with hypoxia: Secondary | ICD-10-CM | POA: Diagnosis not present

## 2022-09-03 DIAGNOSIS — E039 Hypothyroidism, unspecified: Secondary | ICD-10-CM | POA: Diagnosis not present

## 2022-09-03 DIAGNOSIS — M6281 Muscle weakness (generalized): Secondary | ICD-10-CM | POA: Diagnosis not present

## 2022-09-03 DIAGNOSIS — I10 Essential (primary) hypertension: Secondary | ICD-10-CM | POA: Diagnosis not present

## 2022-09-03 DIAGNOSIS — E785 Hyperlipidemia, unspecified: Secondary | ICD-10-CM | POA: Diagnosis not present

## 2022-09-03 DIAGNOSIS — R269 Unspecified abnormalities of gait and mobility: Secondary | ICD-10-CM | POA: Diagnosis not present

## 2022-09-03 DIAGNOSIS — I6930 Unspecified sequelae of cerebral infarction: Secondary | ICD-10-CM | POA: Diagnosis not present

## 2022-09-03 DIAGNOSIS — M6259 Muscle wasting and atrophy, not elsewhere classified, multiple sites: Secondary | ICD-10-CM | POA: Diagnosis not present

## 2022-09-03 DIAGNOSIS — R5381 Other malaise: Secondary | ICD-10-CM | POA: Diagnosis not present

## 2022-09-03 DIAGNOSIS — R1311 Dysphagia, oral phase: Secondary | ICD-10-CM | POA: Diagnosis not present

## 2022-09-03 DIAGNOSIS — E46 Unspecified protein-calorie malnutrition: Secondary | ICD-10-CM | POA: Diagnosis not present

## 2022-09-03 DIAGNOSIS — Z950 Presence of cardiac pacemaker: Secondary | ICD-10-CM | POA: Diagnosis not present

## 2022-09-04 DIAGNOSIS — E785 Hyperlipidemia, unspecified: Secondary | ICD-10-CM | POA: Diagnosis not present

## 2022-09-04 DIAGNOSIS — I6930 Unspecified sequelae of cerebral infarction: Secondary | ICD-10-CM | POA: Diagnosis not present

## 2022-09-04 DIAGNOSIS — R5381 Other malaise: Secondary | ICD-10-CM | POA: Diagnosis not present

## 2022-09-04 DIAGNOSIS — E039 Hypothyroidism, unspecified: Secondary | ICD-10-CM | POA: Diagnosis not present

## 2022-09-04 DIAGNOSIS — E46 Unspecified protein-calorie malnutrition: Secondary | ICD-10-CM | POA: Diagnosis not present

## 2022-09-04 DIAGNOSIS — J9601 Acute respiratory failure with hypoxia: Secondary | ICD-10-CM | POA: Diagnosis not present

## 2022-09-04 DIAGNOSIS — R1311 Dysphagia, oral phase: Secondary | ICD-10-CM | POA: Diagnosis not present

## 2022-09-04 DIAGNOSIS — Z950 Presence of cardiac pacemaker: Secondary | ICD-10-CM | POA: Diagnosis not present

## 2022-09-04 DIAGNOSIS — M6281 Muscle weakness (generalized): Secondary | ICD-10-CM | POA: Diagnosis not present

## 2022-09-04 DIAGNOSIS — I10 Essential (primary) hypertension: Secondary | ICD-10-CM | POA: Diagnosis not present

## 2022-09-04 DIAGNOSIS — M6259 Muscle wasting and atrophy, not elsewhere classified, multiple sites: Secondary | ICD-10-CM | POA: Diagnosis not present

## 2022-09-04 DIAGNOSIS — R269 Unspecified abnormalities of gait and mobility: Secondary | ICD-10-CM | POA: Diagnosis not present

## 2022-09-07 DIAGNOSIS — I10 Essential (primary) hypertension: Secondary | ICD-10-CM | POA: Diagnosis not present

## 2022-09-07 DIAGNOSIS — I6930 Unspecified sequelae of cerebral infarction: Secondary | ICD-10-CM | POA: Diagnosis not present

## 2022-09-07 DIAGNOSIS — E785 Hyperlipidemia, unspecified: Secondary | ICD-10-CM | POA: Diagnosis not present

## 2022-09-07 DIAGNOSIS — R1311 Dysphagia, oral phase: Secondary | ICD-10-CM | POA: Diagnosis not present

## 2022-09-07 DIAGNOSIS — R5381 Other malaise: Secondary | ICD-10-CM | POA: Diagnosis not present

## 2022-09-07 DIAGNOSIS — E46 Unspecified protein-calorie malnutrition: Secondary | ICD-10-CM | POA: Diagnosis not present

## 2022-09-07 DIAGNOSIS — J9601 Acute respiratory failure with hypoxia: Secondary | ICD-10-CM | POA: Diagnosis not present

## 2022-09-07 DIAGNOSIS — M6259 Muscle wasting and atrophy, not elsewhere classified, multiple sites: Secondary | ICD-10-CM | POA: Diagnosis not present

## 2022-09-07 DIAGNOSIS — M6281 Muscle weakness (generalized): Secondary | ICD-10-CM | POA: Diagnosis not present

## 2022-09-07 DIAGNOSIS — E039 Hypothyroidism, unspecified: Secondary | ICD-10-CM | POA: Diagnosis not present

## 2022-09-07 DIAGNOSIS — R269 Unspecified abnormalities of gait and mobility: Secondary | ICD-10-CM | POA: Diagnosis not present

## 2022-09-07 DIAGNOSIS — Z950 Presence of cardiac pacemaker: Secondary | ICD-10-CM | POA: Diagnosis not present

## 2022-09-08 DIAGNOSIS — I10 Essential (primary) hypertension: Secondary | ICD-10-CM | POA: Diagnosis not present

## 2022-09-08 DIAGNOSIS — I6930 Unspecified sequelae of cerebral infarction: Secondary | ICD-10-CM | POA: Diagnosis not present

## 2022-09-08 DIAGNOSIS — Z950 Presence of cardiac pacemaker: Secondary | ICD-10-CM | POA: Diagnosis not present

## 2022-09-08 DIAGNOSIS — J9601 Acute respiratory failure with hypoxia: Secondary | ICD-10-CM | POA: Diagnosis not present

## 2022-09-08 DIAGNOSIS — E46 Unspecified protein-calorie malnutrition: Secondary | ICD-10-CM | POA: Diagnosis not present

## 2022-09-08 DIAGNOSIS — R269 Unspecified abnormalities of gait and mobility: Secondary | ICD-10-CM | POA: Diagnosis not present

## 2022-09-08 DIAGNOSIS — M6281 Muscle weakness (generalized): Secondary | ICD-10-CM | POA: Diagnosis not present

## 2022-09-08 DIAGNOSIS — E039 Hypothyroidism, unspecified: Secondary | ICD-10-CM | POA: Diagnosis not present

## 2022-09-08 DIAGNOSIS — M6259 Muscle wasting and atrophy, not elsewhere classified, multiple sites: Secondary | ICD-10-CM | POA: Diagnosis not present

## 2022-09-08 DIAGNOSIS — R1311 Dysphagia, oral phase: Secondary | ICD-10-CM | POA: Diagnosis not present

## 2022-09-08 DIAGNOSIS — R5381 Other malaise: Secondary | ICD-10-CM | POA: Diagnosis not present

## 2022-09-08 DIAGNOSIS — E785 Hyperlipidemia, unspecified: Secondary | ICD-10-CM | POA: Diagnosis not present

## 2022-09-10 DIAGNOSIS — R5381 Other malaise: Secondary | ICD-10-CM | POA: Diagnosis not present

## 2022-09-10 DIAGNOSIS — I6930 Unspecified sequelae of cerebral infarction: Secondary | ICD-10-CM | POA: Diagnosis not present

## 2022-09-10 DIAGNOSIS — R269 Unspecified abnormalities of gait and mobility: Secondary | ICD-10-CM | POA: Diagnosis not present

## 2022-09-10 DIAGNOSIS — M6259 Muscle wasting and atrophy, not elsewhere classified, multiple sites: Secondary | ICD-10-CM | POA: Diagnosis not present

## 2022-09-10 DIAGNOSIS — R1311 Dysphagia, oral phase: Secondary | ICD-10-CM | POA: Diagnosis not present

## 2022-09-10 DIAGNOSIS — I10 Essential (primary) hypertension: Secondary | ICD-10-CM | POA: Diagnosis not present

## 2022-09-10 DIAGNOSIS — E039 Hypothyroidism, unspecified: Secondary | ICD-10-CM | POA: Diagnosis not present

## 2022-09-10 DIAGNOSIS — M6281 Muscle weakness (generalized): Secondary | ICD-10-CM | POA: Diagnosis not present

## 2022-09-10 DIAGNOSIS — E785 Hyperlipidemia, unspecified: Secondary | ICD-10-CM | POA: Diagnosis not present

## 2022-09-10 DIAGNOSIS — Z950 Presence of cardiac pacemaker: Secondary | ICD-10-CM | POA: Diagnosis not present

## 2022-09-10 DIAGNOSIS — J9601 Acute respiratory failure with hypoxia: Secondary | ICD-10-CM | POA: Diagnosis not present

## 2022-09-10 DIAGNOSIS — E46 Unspecified protein-calorie malnutrition: Secondary | ICD-10-CM | POA: Diagnosis not present

## 2022-09-12 DIAGNOSIS — E039 Hypothyroidism, unspecified: Secondary | ICD-10-CM | POA: Diagnosis not present

## 2022-09-12 DIAGNOSIS — I6930 Unspecified sequelae of cerebral infarction: Secondary | ICD-10-CM | POA: Diagnosis not present

## 2022-09-12 DIAGNOSIS — R269 Unspecified abnormalities of gait and mobility: Secondary | ICD-10-CM | POA: Diagnosis not present

## 2022-09-12 DIAGNOSIS — E785 Hyperlipidemia, unspecified: Secondary | ICD-10-CM | POA: Diagnosis not present

## 2022-09-12 DIAGNOSIS — M6259 Muscle wasting and atrophy, not elsewhere classified, multiple sites: Secondary | ICD-10-CM | POA: Diagnosis not present

## 2022-09-12 DIAGNOSIS — M6281 Muscle weakness (generalized): Secondary | ICD-10-CM | POA: Diagnosis not present

## 2022-09-12 DIAGNOSIS — J9601 Acute respiratory failure with hypoxia: Secondary | ICD-10-CM | POA: Diagnosis not present

## 2022-09-12 DIAGNOSIS — R1311 Dysphagia, oral phase: Secondary | ICD-10-CM | POA: Diagnosis not present

## 2022-09-12 DIAGNOSIS — E46 Unspecified protein-calorie malnutrition: Secondary | ICD-10-CM | POA: Diagnosis not present

## 2022-09-12 DIAGNOSIS — Z950 Presence of cardiac pacemaker: Secondary | ICD-10-CM | POA: Diagnosis not present

## 2022-09-12 DIAGNOSIS — R5381 Other malaise: Secondary | ICD-10-CM | POA: Diagnosis not present

## 2022-09-12 DIAGNOSIS — I10 Essential (primary) hypertension: Secondary | ICD-10-CM | POA: Diagnosis not present

## 2022-09-13 DIAGNOSIS — M6259 Muscle wasting and atrophy, not elsewhere classified, multiple sites: Secondary | ICD-10-CM | POA: Diagnosis not present

## 2022-09-13 DIAGNOSIS — R1311 Dysphagia, oral phase: Secondary | ICD-10-CM | POA: Diagnosis not present

## 2022-09-13 DIAGNOSIS — M6281 Muscle weakness (generalized): Secondary | ICD-10-CM | POA: Diagnosis not present

## 2022-09-13 DIAGNOSIS — E46 Unspecified protein-calorie malnutrition: Secondary | ICD-10-CM | POA: Diagnosis not present

## 2022-09-13 DIAGNOSIS — Z950 Presence of cardiac pacemaker: Secondary | ICD-10-CM | POA: Diagnosis not present

## 2022-09-13 DIAGNOSIS — E785 Hyperlipidemia, unspecified: Secondary | ICD-10-CM | POA: Diagnosis not present

## 2022-09-13 DIAGNOSIS — R5381 Other malaise: Secondary | ICD-10-CM | POA: Diagnosis not present

## 2022-09-13 DIAGNOSIS — I6930 Unspecified sequelae of cerebral infarction: Secondary | ICD-10-CM | POA: Diagnosis not present

## 2022-09-13 DIAGNOSIS — E039 Hypothyroidism, unspecified: Secondary | ICD-10-CM | POA: Diagnosis not present

## 2022-09-13 DIAGNOSIS — J9601 Acute respiratory failure with hypoxia: Secondary | ICD-10-CM | POA: Diagnosis not present

## 2022-09-13 DIAGNOSIS — R269 Unspecified abnormalities of gait and mobility: Secondary | ICD-10-CM | POA: Diagnosis not present

## 2022-09-13 DIAGNOSIS — I10 Essential (primary) hypertension: Secondary | ICD-10-CM | POA: Diagnosis not present

## 2022-09-14 DIAGNOSIS — R269 Unspecified abnormalities of gait and mobility: Secondary | ICD-10-CM | POA: Diagnosis not present

## 2022-09-14 DIAGNOSIS — I6930 Unspecified sequelae of cerebral infarction: Secondary | ICD-10-CM | POA: Diagnosis not present

## 2022-09-14 DIAGNOSIS — R5381 Other malaise: Secondary | ICD-10-CM | POA: Diagnosis not present

## 2022-09-14 DIAGNOSIS — E785 Hyperlipidemia, unspecified: Secondary | ICD-10-CM | POA: Diagnosis not present

## 2022-09-14 DIAGNOSIS — E46 Unspecified protein-calorie malnutrition: Secondary | ICD-10-CM | POA: Diagnosis not present

## 2022-09-14 DIAGNOSIS — M6281 Muscle weakness (generalized): Secondary | ICD-10-CM | POA: Diagnosis not present

## 2022-09-14 DIAGNOSIS — Z950 Presence of cardiac pacemaker: Secondary | ICD-10-CM | POA: Diagnosis not present

## 2022-09-14 DIAGNOSIS — M6259 Muscle wasting and atrophy, not elsewhere classified, multiple sites: Secondary | ICD-10-CM | POA: Diagnosis not present

## 2022-09-14 DIAGNOSIS — I10 Essential (primary) hypertension: Secondary | ICD-10-CM | POA: Diagnosis not present

## 2022-09-14 DIAGNOSIS — R1311 Dysphagia, oral phase: Secondary | ICD-10-CM | POA: Diagnosis not present

## 2022-09-14 DIAGNOSIS — E039 Hypothyroidism, unspecified: Secondary | ICD-10-CM | POA: Diagnosis not present

## 2022-09-14 DIAGNOSIS — J9601 Acute respiratory failure with hypoxia: Secondary | ICD-10-CM | POA: Diagnosis not present

## 2022-09-15 DIAGNOSIS — R1311 Dysphagia, oral phase: Secondary | ICD-10-CM | POA: Diagnosis not present

## 2022-09-15 DIAGNOSIS — J9601 Acute respiratory failure with hypoxia: Secondary | ICD-10-CM | POA: Diagnosis not present

## 2022-09-15 DIAGNOSIS — R5381 Other malaise: Secondary | ICD-10-CM | POA: Diagnosis not present

## 2022-09-15 DIAGNOSIS — I10 Essential (primary) hypertension: Secondary | ICD-10-CM | POA: Diagnosis not present

## 2022-09-15 DIAGNOSIS — M6281 Muscle weakness (generalized): Secondary | ICD-10-CM | POA: Diagnosis not present

## 2022-09-15 DIAGNOSIS — E039 Hypothyroidism, unspecified: Secondary | ICD-10-CM | POA: Diagnosis not present

## 2022-09-15 DIAGNOSIS — Z950 Presence of cardiac pacemaker: Secondary | ICD-10-CM | POA: Diagnosis not present

## 2022-09-15 DIAGNOSIS — E785 Hyperlipidemia, unspecified: Secondary | ICD-10-CM | POA: Diagnosis not present

## 2022-09-15 DIAGNOSIS — E46 Unspecified protein-calorie malnutrition: Secondary | ICD-10-CM | POA: Diagnosis not present

## 2022-09-15 DIAGNOSIS — R269 Unspecified abnormalities of gait and mobility: Secondary | ICD-10-CM | POA: Diagnosis not present

## 2022-09-15 DIAGNOSIS — M6259 Muscle wasting and atrophy, not elsewhere classified, multiple sites: Secondary | ICD-10-CM | POA: Diagnosis not present

## 2022-09-15 DIAGNOSIS — I6930 Unspecified sequelae of cerebral infarction: Secondary | ICD-10-CM | POA: Diagnosis not present

## 2022-09-16 DIAGNOSIS — J9601 Acute respiratory failure with hypoxia: Secondary | ICD-10-CM | POA: Diagnosis not present

## 2022-09-16 DIAGNOSIS — E039 Hypothyroidism, unspecified: Secondary | ICD-10-CM | POA: Diagnosis not present

## 2022-09-16 DIAGNOSIS — R1311 Dysphagia, oral phase: Secondary | ICD-10-CM | POA: Diagnosis not present

## 2022-09-16 DIAGNOSIS — R5381 Other malaise: Secondary | ICD-10-CM | POA: Diagnosis not present

## 2022-09-16 DIAGNOSIS — Z950 Presence of cardiac pacemaker: Secondary | ICD-10-CM | POA: Diagnosis not present

## 2022-09-16 DIAGNOSIS — I10 Essential (primary) hypertension: Secondary | ICD-10-CM | POA: Diagnosis not present

## 2022-09-16 DIAGNOSIS — M6281 Muscle weakness (generalized): Secondary | ICD-10-CM | POA: Diagnosis not present

## 2022-09-16 DIAGNOSIS — I6930 Unspecified sequelae of cerebral infarction: Secondary | ICD-10-CM | POA: Diagnosis not present

## 2022-09-16 DIAGNOSIS — M6259 Muscle wasting and atrophy, not elsewhere classified, multiple sites: Secondary | ICD-10-CM | POA: Diagnosis not present

## 2022-09-16 DIAGNOSIS — E46 Unspecified protein-calorie malnutrition: Secondary | ICD-10-CM | POA: Diagnosis not present

## 2022-09-16 DIAGNOSIS — R269 Unspecified abnormalities of gait and mobility: Secondary | ICD-10-CM | POA: Diagnosis not present

## 2022-09-16 DIAGNOSIS — E785 Hyperlipidemia, unspecified: Secondary | ICD-10-CM | POA: Diagnosis not present

## 2022-09-18 DIAGNOSIS — E785 Hyperlipidemia, unspecified: Secondary | ICD-10-CM | POA: Diagnosis not present

## 2022-09-18 DIAGNOSIS — M6281 Muscle weakness (generalized): Secondary | ICD-10-CM | POA: Diagnosis not present

## 2022-09-18 DIAGNOSIS — R269 Unspecified abnormalities of gait and mobility: Secondary | ICD-10-CM | POA: Diagnosis not present

## 2022-09-18 DIAGNOSIS — R1311 Dysphagia, oral phase: Secondary | ICD-10-CM | POA: Diagnosis not present

## 2022-09-18 DIAGNOSIS — Z950 Presence of cardiac pacemaker: Secondary | ICD-10-CM | POA: Diagnosis not present

## 2022-09-18 DIAGNOSIS — M6259 Muscle wasting and atrophy, not elsewhere classified, multiple sites: Secondary | ICD-10-CM | POA: Diagnosis not present

## 2022-09-18 DIAGNOSIS — R5381 Other malaise: Secondary | ICD-10-CM | POA: Diagnosis not present

## 2022-09-18 DIAGNOSIS — I6930 Unspecified sequelae of cerebral infarction: Secondary | ICD-10-CM | POA: Diagnosis not present

## 2022-09-18 DIAGNOSIS — E039 Hypothyroidism, unspecified: Secondary | ICD-10-CM | POA: Diagnosis not present

## 2022-09-18 DIAGNOSIS — I10 Essential (primary) hypertension: Secondary | ICD-10-CM | POA: Diagnosis not present

## 2022-09-18 DIAGNOSIS — J9601 Acute respiratory failure with hypoxia: Secondary | ICD-10-CM | POA: Diagnosis not present

## 2022-09-18 DIAGNOSIS — E46 Unspecified protein-calorie malnutrition: Secondary | ICD-10-CM | POA: Diagnosis not present

## 2022-09-21 DIAGNOSIS — R1311 Dysphagia, oral phase: Secondary | ICD-10-CM | POA: Diagnosis not present

## 2022-09-21 DIAGNOSIS — E46 Unspecified protein-calorie malnutrition: Secondary | ICD-10-CM | POA: Diagnosis not present

## 2022-09-21 DIAGNOSIS — E039 Hypothyroidism, unspecified: Secondary | ICD-10-CM | POA: Diagnosis not present

## 2022-09-21 DIAGNOSIS — J9601 Acute respiratory failure with hypoxia: Secondary | ICD-10-CM | POA: Diagnosis not present

## 2022-09-21 DIAGNOSIS — I10 Essential (primary) hypertension: Secondary | ICD-10-CM | POA: Diagnosis not present

## 2022-09-21 DIAGNOSIS — Z950 Presence of cardiac pacemaker: Secondary | ICD-10-CM | POA: Diagnosis not present

## 2022-09-21 DIAGNOSIS — R269 Unspecified abnormalities of gait and mobility: Secondary | ICD-10-CM | POA: Diagnosis not present

## 2022-09-21 DIAGNOSIS — I6930 Unspecified sequelae of cerebral infarction: Secondary | ICD-10-CM | POA: Diagnosis not present

## 2022-09-21 DIAGNOSIS — R5381 Other malaise: Secondary | ICD-10-CM | POA: Diagnosis not present

## 2022-09-21 DIAGNOSIS — M6259 Muscle wasting and atrophy, not elsewhere classified, multiple sites: Secondary | ICD-10-CM | POA: Diagnosis not present

## 2022-09-21 DIAGNOSIS — M6281 Muscle weakness (generalized): Secondary | ICD-10-CM | POA: Diagnosis not present

## 2022-09-21 DIAGNOSIS — E785 Hyperlipidemia, unspecified: Secondary | ICD-10-CM | POA: Diagnosis not present

## 2022-09-22 DIAGNOSIS — M6259 Muscle wasting and atrophy, not elsewhere classified, multiple sites: Secondary | ICD-10-CM | POA: Diagnosis not present

## 2022-09-22 DIAGNOSIS — J9601 Acute respiratory failure with hypoxia: Secondary | ICD-10-CM | POA: Diagnosis not present

## 2022-09-22 DIAGNOSIS — N390 Urinary tract infection, site not specified: Secondary | ICD-10-CM | POA: Diagnosis not present

## 2022-09-22 DIAGNOSIS — E785 Hyperlipidemia, unspecified: Secondary | ICD-10-CM | POA: Diagnosis not present

## 2022-09-22 DIAGNOSIS — F01511 Vascular dementia, unspecified severity, with agitation: Secondary | ICD-10-CM | POA: Diagnosis not present

## 2022-09-22 DIAGNOSIS — R1311 Dysphagia, oral phase: Secondary | ICD-10-CM | POA: Diagnosis not present

## 2022-09-22 DIAGNOSIS — E039 Hypothyroidism, unspecified: Secondary | ICD-10-CM | POA: Diagnosis not present

## 2022-09-22 DIAGNOSIS — I69311 Memory deficit following cerebral infarction: Secondary | ICD-10-CM | POA: Diagnosis not present

## 2022-09-22 DIAGNOSIS — E46 Unspecified protein-calorie malnutrition: Secondary | ICD-10-CM | POA: Diagnosis not present

## 2022-09-22 DIAGNOSIS — R269 Unspecified abnormalities of gait and mobility: Secondary | ICD-10-CM | POA: Diagnosis not present

## 2022-09-22 DIAGNOSIS — Z950 Presence of cardiac pacemaker: Secondary | ICD-10-CM | POA: Diagnosis not present

## 2022-09-22 DIAGNOSIS — I10 Essential (primary) hypertension: Secondary | ICD-10-CM | POA: Diagnosis not present

## 2022-09-22 DIAGNOSIS — I6930 Unspecified sequelae of cerebral infarction: Secondary | ICD-10-CM | POA: Diagnosis not present

## 2022-09-22 DIAGNOSIS — R5381 Other malaise: Secondary | ICD-10-CM | POA: Diagnosis not present

## 2022-09-22 DIAGNOSIS — R451 Restlessness and agitation: Secondary | ICD-10-CM | POA: Diagnosis not present

## 2022-09-22 DIAGNOSIS — M6281 Muscle weakness (generalized): Secondary | ICD-10-CM | POA: Diagnosis not present

## 2022-09-23 DIAGNOSIS — I6930 Unspecified sequelae of cerebral infarction: Secondary | ICD-10-CM | POA: Diagnosis not present

## 2022-09-23 DIAGNOSIS — J9601 Acute respiratory failure with hypoxia: Secondary | ICD-10-CM | POA: Diagnosis not present

## 2022-09-23 DIAGNOSIS — R5381 Other malaise: Secondary | ICD-10-CM | POA: Diagnosis not present

## 2022-09-23 DIAGNOSIS — I10 Essential (primary) hypertension: Secondary | ICD-10-CM | POA: Diagnosis not present

## 2022-09-23 DIAGNOSIS — E039 Hypothyroidism, unspecified: Secondary | ICD-10-CM | POA: Diagnosis not present

## 2022-09-23 DIAGNOSIS — R1311 Dysphagia, oral phase: Secondary | ICD-10-CM | POA: Diagnosis not present

## 2022-09-23 DIAGNOSIS — E785 Hyperlipidemia, unspecified: Secondary | ICD-10-CM | POA: Diagnosis not present

## 2022-09-23 DIAGNOSIS — R269 Unspecified abnormalities of gait and mobility: Secondary | ICD-10-CM | POA: Diagnosis not present

## 2022-09-23 DIAGNOSIS — E46 Unspecified protein-calorie malnutrition: Secondary | ICD-10-CM | POA: Diagnosis not present

## 2022-09-23 DIAGNOSIS — M6259 Muscle wasting and atrophy, not elsewhere classified, multiple sites: Secondary | ICD-10-CM | POA: Diagnosis not present

## 2022-09-23 DIAGNOSIS — Z950 Presence of cardiac pacemaker: Secondary | ICD-10-CM | POA: Diagnosis not present

## 2022-09-23 DIAGNOSIS — M6281 Muscle weakness (generalized): Secondary | ICD-10-CM | POA: Diagnosis not present

## 2022-09-24 DIAGNOSIS — J9601 Acute respiratory failure with hypoxia: Secondary | ICD-10-CM | POA: Diagnosis not present

## 2022-09-24 DIAGNOSIS — R5381 Other malaise: Secondary | ICD-10-CM | POA: Diagnosis not present

## 2022-09-24 DIAGNOSIS — E785 Hyperlipidemia, unspecified: Secondary | ICD-10-CM | POA: Diagnosis not present

## 2022-09-24 DIAGNOSIS — R269 Unspecified abnormalities of gait and mobility: Secondary | ICD-10-CM | POA: Diagnosis not present

## 2022-09-24 DIAGNOSIS — E46 Unspecified protein-calorie malnutrition: Secondary | ICD-10-CM | POA: Diagnosis not present

## 2022-09-24 DIAGNOSIS — M6281 Muscle weakness (generalized): Secondary | ICD-10-CM | POA: Diagnosis not present

## 2022-09-24 DIAGNOSIS — E039 Hypothyroidism, unspecified: Secondary | ICD-10-CM | POA: Diagnosis not present

## 2022-09-24 DIAGNOSIS — M6259 Muscle wasting and atrophy, not elsewhere classified, multiple sites: Secondary | ICD-10-CM | POA: Diagnosis not present

## 2022-09-24 DIAGNOSIS — Z950 Presence of cardiac pacemaker: Secondary | ICD-10-CM | POA: Diagnosis not present

## 2022-09-24 DIAGNOSIS — R1311 Dysphagia, oral phase: Secondary | ICD-10-CM | POA: Diagnosis not present

## 2022-09-24 DIAGNOSIS — I10 Essential (primary) hypertension: Secondary | ICD-10-CM | POA: Diagnosis not present

## 2022-09-24 DIAGNOSIS — I6930 Unspecified sequelae of cerebral infarction: Secondary | ICD-10-CM | POA: Diagnosis not present

## 2022-09-25 DIAGNOSIS — Z741 Need for assistance with personal care: Secondary | ICD-10-CM | POA: Diagnosis not present

## 2022-09-25 DIAGNOSIS — E785 Hyperlipidemia, unspecified: Secondary | ICD-10-CM | POA: Diagnosis not present

## 2022-09-25 DIAGNOSIS — E46 Unspecified protein-calorie malnutrition: Secondary | ICD-10-CM | POA: Diagnosis not present

## 2022-09-25 DIAGNOSIS — M6259 Muscle wasting and atrophy, not elsewhere classified, multiple sites: Secondary | ICD-10-CM | POA: Diagnosis not present

## 2022-09-25 DIAGNOSIS — R5381 Other malaise: Secondary | ICD-10-CM | POA: Diagnosis not present

## 2022-09-25 DIAGNOSIS — E039 Hypothyroidism, unspecified: Secondary | ICD-10-CM | POA: Diagnosis not present

## 2022-09-25 DIAGNOSIS — M6281 Muscle weakness (generalized): Secondary | ICD-10-CM | POA: Diagnosis not present

## 2022-09-25 DIAGNOSIS — R1310 Dysphagia, unspecified: Secondary | ICD-10-CM | POA: Diagnosis not present

## 2022-09-25 DIAGNOSIS — I6930 Unspecified sequelae of cerebral infarction: Secondary | ICD-10-CM | POA: Diagnosis not present

## 2022-09-25 DIAGNOSIS — J9601 Acute respiratory failure with hypoxia: Secondary | ICD-10-CM | POA: Diagnosis not present

## 2022-09-25 DIAGNOSIS — Z950 Presence of cardiac pacemaker: Secondary | ICD-10-CM | POA: Diagnosis not present

## 2022-09-25 DIAGNOSIS — R1311 Dysphagia, oral phase: Secondary | ICD-10-CM | POA: Diagnosis not present

## 2022-09-25 DIAGNOSIS — I10 Essential (primary) hypertension: Secondary | ICD-10-CM | POA: Diagnosis not present

## 2022-09-26 DIAGNOSIS — E785 Hyperlipidemia, unspecified: Secondary | ICD-10-CM | POA: Diagnosis not present

## 2022-09-26 DIAGNOSIS — M6259 Muscle wasting and atrophy, not elsewhere classified, multiple sites: Secondary | ICD-10-CM | POA: Diagnosis not present

## 2022-09-26 DIAGNOSIS — E039 Hypothyroidism, unspecified: Secondary | ICD-10-CM | POA: Diagnosis not present

## 2022-09-26 DIAGNOSIS — Z950 Presence of cardiac pacemaker: Secondary | ICD-10-CM | POA: Diagnosis not present

## 2022-09-26 DIAGNOSIS — M6281 Muscle weakness (generalized): Secondary | ICD-10-CM | POA: Diagnosis not present

## 2022-09-26 DIAGNOSIS — I10 Essential (primary) hypertension: Secondary | ICD-10-CM | POA: Diagnosis not present

## 2022-09-26 DIAGNOSIS — Z741 Need for assistance with personal care: Secondary | ICD-10-CM | POA: Diagnosis not present

## 2022-09-26 DIAGNOSIS — R5381 Other malaise: Secondary | ICD-10-CM | POA: Diagnosis not present

## 2022-09-26 DIAGNOSIS — J9601 Acute respiratory failure with hypoxia: Secondary | ICD-10-CM | POA: Diagnosis not present

## 2022-09-26 DIAGNOSIS — R1310 Dysphagia, unspecified: Secondary | ICD-10-CM | POA: Diagnosis not present

## 2022-09-26 DIAGNOSIS — R1311 Dysphagia, oral phase: Secondary | ICD-10-CM | POA: Diagnosis not present

## 2022-09-26 DIAGNOSIS — E46 Unspecified protein-calorie malnutrition: Secondary | ICD-10-CM | POA: Diagnosis not present

## 2022-09-26 DIAGNOSIS — I6930 Unspecified sequelae of cerebral infarction: Secondary | ICD-10-CM | POA: Diagnosis not present

## 2022-09-29 DIAGNOSIS — E785 Hyperlipidemia, unspecified: Secondary | ICD-10-CM | POA: Diagnosis not present

## 2022-09-29 DIAGNOSIS — R1310 Dysphagia, unspecified: Secondary | ICD-10-CM | POA: Diagnosis not present

## 2022-09-29 DIAGNOSIS — Z741 Need for assistance with personal care: Secondary | ICD-10-CM | POA: Diagnosis not present

## 2022-09-29 DIAGNOSIS — I10 Essential (primary) hypertension: Secondary | ICD-10-CM | POA: Diagnosis not present

## 2022-09-29 DIAGNOSIS — E46 Unspecified protein-calorie malnutrition: Secondary | ICD-10-CM | POA: Diagnosis not present

## 2022-09-29 DIAGNOSIS — R5381 Other malaise: Secondary | ICD-10-CM | POA: Diagnosis not present

## 2022-09-29 DIAGNOSIS — Z950 Presence of cardiac pacemaker: Secondary | ICD-10-CM | POA: Diagnosis not present

## 2022-09-29 DIAGNOSIS — J9601 Acute respiratory failure with hypoxia: Secondary | ICD-10-CM | POA: Diagnosis not present

## 2022-09-29 DIAGNOSIS — I6930 Unspecified sequelae of cerebral infarction: Secondary | ICD-10-CM | POA: Diagnosis not present

## 2022-09-29 DIAGNOSIS — M6281 Muscle weakness (generalized): Secondary | ICD-10-CM | POA: Diagnosis not present

## 2022-09-29 DIAGNOSIS — R1311 Dysphagia, oral phase: Secondary | ICD-10-CM | POA: Diagnosis not present

## 2022-09-29 DIAGNOSIS — E039 Hypothyroidism, unspecified: Secondary | ICD-10-CM | POA: Diagnosis not present

## 2022-09-29 DIAGNOSIS — M6259 Muscle wasting and atrophy, not elsewhere classified, multiple sites: Secondary | ICD-10-CM | POA: Diagnosis not present

## 2022-09-30 DIAGNOSIS — E785 Hyperlipidemia, unspecified: Secondary | ICD-10-CM | POA: Diagnosis not present

## 2022-09-30 DIAGNOSIS — Z741 Need for assistance with personal care: Secondary | ICD-10-CM | POA: Diagnosis not present

## 2022-09-30 DIAGNOSIS — E46 Unspecified protein-calorie malnutrition: Secondary | ICD-10-CM | POA: Diagnosis not present

## 2022-09-30 DIAGNOSIS — J9601 Acute respiratory failure with hypoxia: Secondary | ICD-10-CM | POA: Diagnosis not present

## 2022-09-30 DIAGNOSIS — I6930 Unspecified sequelae of cerebral infarction: Secondary | ICD-10-CM | POA: Diagnosis not present

## 2022-09-30 DIAGNOSIS — E039 Hypothyroidism, unspecified: Secondary | ICD-10-CM | POA: Diagnosis not present

## 2022-09-30 DIAGNOSIS — R1310 Dysphagia, unspecified: Secondary | ICD-10-CM | POA: Diagnosis not present

## 2022-09-30 DIAGNOSIS — R5381 Other malaise: Secondary | ICD-10-CM | POA: Diagnosis not present

## 2022-09-30 DIAGNOSIS — Z950 Presence of cardiac pacemaker: Secondary | ICD-10-CM | POA: Diagnosis not present

## 2022-09-30 DIAGNOSIS — R1311 Dysphagia, oral phase: Secondary | ICD-10-CM | POA: Diagnosis not present

## 2022-09-30 DIAGNOSIS — I10 Essential (primary) hypertension: Secondary | ICD-10-CM | POA: Diagnosis not present

## 2022-09-30 DIAGNOSIS — M6259 Muscle wasting and atrophy, not elsewhere classified, multiple sites: Secondary | ICD-10-CM | POA: Diagnosis not present

## 2022-09-30 DIAGNOSIS — M6281 Muscle weakness (generalized): Secondary | ICD-10-CM | POA: Diagnosis not present

## 2022-10-01 DIAGNOSIS — J9601 Acute respiratory failure with hypoxia: Secondary | ICD-10-CM | POA: Diagnosis not present

## 2022-10-01 DIAGNOSIS — I10 Essential (primary) hypertension: Secondary | ICD-10-CM | POA: Diagnosis not present

## 2022-10-01 DIAGNOSIS — R1311 Dysphagia, oral phase: Secondary | ICD-10-CM | POA: Diagnosis not present

## 2022-10-01 DIAGNOSIS — I6930 Unspecified sequelae of cerebral infarction: Secondary | ICD-10-CM | POA: Diagnosis not present

## 2022-10-01 DIAGNOSIS — M6281 Muscle weakness (generalized): Secondary | ICD-10-CM | POA: Diagnosis not present

## 2022-10-01 DIAGNOSIS — M6259 Muscle wasting and atrophy, not elsewhere classified, multiple sites: Secondary | ICD-10-CM | POA: Diagnosis not present

## 2022-10-01 DIAGNOSIS — R5381 Other malaise: Secondary | ICD-10-CM | POA: Diagnosis not present

## 2022-10-01 DIAGNOSIS — R1310 Dysphagia, unspecified: Secondary | ICD-10-CM | POA: Diagnosis not present

## 2022-10-01 DIAGNOSIS — E46 Unspecified protein-calorie malnutrition: Secondary | ICD-10-CM | POA: Diagnosis not present

## 2022-10-01 DIAGNOSIS — E785 Hyperlipidemia, unspecified: Secondary | ICD-10-CM | POA: Diagnosis not present

## 2022-10-01 DIAGNOSIS — Z950 Presence of cardiac pacemaker: Secondary | ICD-10-CM | POA: Diagnosis not present

## 2022-10-01 DIAGNOSIS — Z741 Need for assistance with personal care: Secondary | ICD-10-CM | POA: Diagnosis not present

## 2022-10-01 DIAGNOSIS — E039 Hypothyroidism, unspecified: Secondary | ICD-10-CM | POA: Diagnosis not present

## 2022-10-02 DIAGNOSIS — I6930 Unspecified sequelae of cerebral infarction: Secondary | ICD-10-CM | POA: Diagnosis not present

## 2022-10-02 DIAGNOSIS — R1311 Dysphagia, oral phase: Secondary | ICD-10-CM | POA: Diagnosis not present

## 2022-10-02 DIAGNOSIS — E785 Hyperlipidemia, unspecified: Secondary | ICD-10-CM | POA: Diagnosis not present

## 2022-10-02 DIAGNOSIS — R1310 Dysphagia, unspecified: Secondary | ICD-10-CM | POA: Diagnosis not present

## 2022-10-02 DIAGNOSIS — M6281 Muscle weakness (generalized): Secondary | ICD-10-CM | POA: Diagnosis not present

## 2022-10-02 DIAGNOSIS — J9601 Acute respiratory failure with hypoxia: Secondary | ICD-10-CM | POA: Diagnosis not present

## 2022-10-02 DIAGNOSIS — E46 Unspecified protein-calorie malnutrition: Secondary | ICD-10-CM | POA: Diagnosis not present

## 2022-10-02 DIAGNOSIS — I10 Essential (primary) hypertension: Secondary | ICD-10-CM | POA: Diagnosis not present

## 2022-10-02 DIAGNOSIS — Z950 Presence of cardiac pacemaker: Secondary | ICD-10-CM | POA: Diagnosis not present

## 2022-10-02 DIAGNOSIS — M6259 Muscle wasting and atrophy, not elsewhere classified, multiple sites: Secondary | ICD-10-CM | POA: Diagnosis not present

## 2022-10-02 DIAGNOSIS — E039 Hypothyroidism, unspecified: Secondary | ICD-10-CM | POA: Diagnosis not present

## 2022-10-02 DIAGNOSIS — R5381 Other malaise: Secondary | ICD-10-CM | POA: Diagnosis not present

## 2022-10-02 DIAGNOSIS — Z741 Need for assistance with personal care: Secondary | ICD-10-CM | POA: Diagnosis not present

## 2022-10-03 DIAGNOSIS — E46 Unspecified protein-calorie malnutrition: Secondary | ICD-10-CM | POA: Diagnosis not present

## 2022-10-03 DIAGNOSIS — M6281 Muscle weakness (generalized): Secondary | ICD-10-CM | POA: Diagnosis not present

## 2022-10-03 DIAGNOSIS — I6930 Unspecified sequelae of cerebral infarction: Secondary | ICD-10-CM | POA: Diagnosis not present

## 2022-10-03 DIAGNOSIS — R1311 Dysphagia, oral phase: Secondary | ICD-10-CM | POA: Diagnosis not present

## 2022-10-03 DIAGNOSIS — Z950 Presence of cardiac pacemaker: Secondary | ICD-10-CM | POA: Diagnosis not present

## 2022-10-03 DIAGNOSIS — R5381 Other malaise: Secondary | ICD-10-CM | POA: Diagnosis not present

## 2022-10-03 DIAGNOSIS — J9601 Acute respiratory failure with hypoxia: Secondary | ICD-10-CM | POA: Diagnosis not present

## 2022-10-03 DIAGNOSIS — R1310 Dysphagia, unspecified: Secondary | ICD-10-CM | POA: Diagnosis not present

## 2022-10-03 DIAGNOSIS — M6259 Muscle wasting and atrophy, not elsewhere classified, multiple sites: Secondary | ICD-10-CM | POA: Diagnosis not present

## 2022-10-03 DIAGNOSIS — E785 Hyperlipidemia, unspecified: Secondary | ICD-10-CM | POA: Diagnosis not present

## 2022-10-03 DIAGNOSIS — E039 Hypothyroidism, unspecified: Secondary | ICD-10-CM | POA: Diagnosis not present

## 2022-10-03 DIAGNOSIS — Z741 Need for assistance with personal care: Secondary | ICD-10-CM | POA: Diagnosis not present

## 2022-10-03 DIAGNOSIS — I10 Essential (primary) hypertension: Secondary | ICD-10-CM | POA: Diagnosis not present

## 2022-10-05 DIAGNOSIS — Z950 Presence of cardiac pacemaker: Secondary | ICD-10-CM | POA: Diagnosis not present

## 2022-10-05 DIAGNOSIS — R5381 Other malaise: Secondary | ICD-10-CM | POA: Diagnosis not present

## 2022-10-05 DIAGNOSIS — M6259 Muscle wasting and atrophy, not elsewhere classified, multiple sites: Secondary | ICD-10-CM | POA: Diagnosis not present

## 2022-10-05 DIAGNOSIS — F03918 Unspecified dementia, unspecified severity, with other behavioral disturbance: Secondary | ICD-10-CM | POA: Diagnosis not present

## 2022-10-05 DIAGNOSIS — I119 Hypertensive heart disease without heart failure: Secondary | ICD-10-CM | POA: Diagnosis not present

## 2022-10-05 DIAGNOSIS — I10 Essential (primary) hypertension: Secondary | ICD-10-CM | POA: Diagnosis not present

## 2022-10-05 DIAGNOSIS — I6930 Unspecified sequelae of cerebral infarction: Secondary | ICD-10-CM | POA: Diagnosis not present

## 2022-10-05 DIAGNOSIS — E46 Unspecified protein-calorie malnutrition: Secondary | ICD-10-CM | POA: Diagnosis not present

## 2022-10-05 DIAGNOSIS — Z741 Need for assistance with personal care: Secondary | ICD-10-CM | POA: Diagnosis not present

## 2022-10-05 DIAGNOSIS — M6281 Muscle weakness (generalized): Secondary | ICD-10-CM | POA: Diagnosis not present

## 2022-10-05 DIAGNOSIS — J9601 Acute respiratory failure with hypoxia: Secondary | ICD-10-CM | POA: Diagnosis not present

## 2022-10-05 DIAGNOSIS — R1311 Dysphagia, oral phase: Secondary | ICD-10-CM | POA: Diagnosis not present

## 2022-10-05 DIAGNOSIS — E785 Hyperlipidemia, unspecified: Secondary | ICD-10-CM | POA: Diagnosis not present

## 2022-10-05 DIAGNOSIS — R1310 Dysphagia, unspecified: Secondary | ICD-10-CM | POA: Diagnosis not present

## 2022-10-05 DIAGNOSIS — E039 Hypothyroidism, unspecified: Secondary | ICD-10-CM | POA: Diagnosis not present

## 2022-10-06 DIAGNOSIS — M6281 Muscle weakness (generalized): Secondary | ICD-10-CM | POA: Diagnosis not present

## 2022-10-06 DIAGNOSIS — I10 Essential (primary) hypertension: Secondary | ICD-10-CM | POA: Diagnosis not present

## 2022-10-06 DIAGNOSIS — M6259 Muscle wasting and atrophy, not elsewhere classified, multiple sites: Secondary | ICD-10-CM | POA: Diagnosis not present

## 2022-10-06 DIAGNOSIS — Z741 Need for assistance with personal care: Secondary | ICD-10-CM | POA: Diagnosis not present

## 2022-10-06 DIAGNOSIS — R1311 Dysphagia, oral phase: Secondary | ICD-10-CM | POA: Diagnosis not present

## 2022-10-06 DIAGNOSIS — R1310 Dysphagia, unspecified: Secondary | ICD-10-CM | POA: Diagnosis not present

## 2022-10-06 DIAGNOSIS — E785 Hyperlipidemia, unspecified: Secondary | ICD-10-CM | POA: Diagnosis not present

## 2022-10-06 DIAGNOSIS — R5381 Other malaise: Secondary | ICD-10-CM | POA: Diagnosis not present

## 2022-10-06 DIAGNOSIS — Z950 Presence of cardiac pacemaker: Secondary | ICD-10-CM | POA: Diagnosis not present

## 2022-10-06 DIAGNOSIS — E039 Hypothyroidism, unspecified: Secondary | ICD-10-CM | POA: Diagnosis not present

## 2022-10-06 DIAGNOSIS — J9601 Acute respiratory failure with hypoxia: Secondary | ICD-10-CM | POA: Diagnosis not present

## 2022-10-06 DIAGNOSIS — I6930 Unspecified sequelae of cerebral infarction: Secondary | ICD-10-CM | POA: Diagnosis not present

## 2022-10-06 DIAGNOSIS — E46 Unspecified protein-calorie malnutrition: Secondary | ICD-10-CM | POA: Diagnosis not present

## 2022-10-07 DIAGNOSIS — E46 Unspecified protein-calorie malnutrition: Secondary | ICD-10-CM | POA: Diagnosis not present

## 2022-10-07 DIAGNOSIS — Z741 Need for assistance with personal care: Secondary | ICD-10-CM | POA: Diagnosis not present

## 2022-10-07 DIAGNOSIS — R1311 Dysphagia, oral phase: Secondary | ICD-10-CM | POA: Diagnosis not present

## 2022-10-07 DIAGNOSIS — E785 Hyperlipidemia, unspecified: Secondary | ICD-10-CM | POA: Diagnosis not present

## 2022-10-07 DIAGNOSIS — I6930 Unspecified sequelae of cerebral infarction: Secondary | ICD-10-CM | POA: Diagnosis not present

## 2022-10-07 DIAGNOSIS — R1310 Dysphagia, unspecified: Secondary | ICD-10-CM | POA: Diagnosis not present

## 2022-10-07 DIAGNOSIS — R5381 Other malaise: Secondary | ICD-10-CM | POA: Diagnosis not present

## 2022-10-07 DIAGNOSIS — M6259 Muscle wasting and atrophy, not elsewhere classified, multiple sites: Secondary | ICD-10-CM | POA: Diagnosis not present

## 2022-10-07 DIAGNOSIS — M6281 Muscle weakness (generalized): Secondary | ICD-10-CM | POA: Diagnosis not present

## 2022-10-07 DIAGNOSIS — E039 Hypothyroidism, unspecified: Secondary | ICD-10-CM | POA: Diagnosis not present

## 2022-10-07 DIAGNOSIS — I10 Essential (primary) hypertension: Secondary | ICD-10-CM | POA: Diagnosis not present

## 2022-10-07 DIAGNOSIS — Z950 Presence of cardiac pacemaker: Secondary | ICD-10-CM | POA: Diagnosis not present

## 2022-10-07 DIAGNOSIS — J9601 Acute respiratory failure with hypoxia: Secondary | ICD-10-CM | POA: Diagnosis not present

## 2022-10-08 DIAGNOSIS — R1310 Dysphagia, unspecified: Secondary | ICD-10-CM | POA: Diagnosis not present

## 2022-10-08 DIAGNOSIS — Z741 Need for assistance with personal care: Secondary | ICD-10-CM | POA: Diagnosis not present

## 2022-10-08 DIAGNOSIS — M6259 Muscle wasting and atrophy, not elsewhere classified, multiple sites: Secondary | ICD-10-CM | POA: Diagnosis not present

## 2022-10-08 DIAGNOSIS — R1311 Dysphagia, oral phase: Secondary | ICD-10-CM | POA: Diagnosis not present

## 2022-10-08 DIAGNOSIS — E785 Hyperlipidemia, unspecified: Secondary | ICD-10-CM | POA: Diagnosis not present

## 2022-10-08 DIAGNOSIS — Z950 Presence of cardiac pacemaker: Secondary | ICD-10-CM | POA: Diagnosis not present

## 2022-10-08 DIAGNOSIS — E46 Unspecified protein-calorie malnutrition: Secondary | ICD-10-CM | POA: Diagnosis not present

## 2022-10-08 DIAGNOSIS — I6930 Unspecified sequelae of cerebral infarction: Secondary | ICD-10-CM | POA: Diagnosis not present

## 2022-10-08 DIAGNOSIS — I10 Essential (primary) hypertension: Secondary | ICD-10-CM | POA: Diagnosis not present

## 2022-10-08 DIAGNOSIS — R5381 Other malaise: Secondary | ICD-10-CM | POA: Diagnosis not present

## 2022-10-08 DIAGNOSIS — E039 Hypothyroidism, unspecified: Secondary | ICD-10-CM | POA: Diagnosis not present

## 2022-10-08 DIAGNOSIS — J9601 Acute respiratory failure with hypoxia: Secondary | ICD-10-CM | POA: Diagnosis not present

## 2022-10-08 DIAGNOSIS — M6281 Muscle weakness (generalized): Secondary | ICD-10-CM | POA: Diagnosis not present

## 2022-10-09 DIAGNOSIS — E785 Hyperlipidemia, unspecified: Secondary | ICD-10-CM | POA: Diagnosis not present

## 2022-10-09 DIAGNOSIS — R1311 Dysphagia, oral phase: Secondary | ICD-10-CM | POA: Diagnosis not present

## 2022-10-09 DIAGNOSIS — E039 Hypothyroidism, unspecified: Secondary | ICD-10-CM | POA: Diagnosis not present

## 2022-10-09 DIAGNOSIS — M6259 Muscle wasting and atrophy, not elsewhere classified, multiple sites: Secondary | ICD-10-CM | POA: Diagnosis not present

## 2022-10-09 DIAGNOSIS — I10 Essential (primary) hypertension: Secondary | ICD-10-CM | POA: Diagnosis not present

## 2022-10-09 DIAGNOSIS — I6930 Unspecified sequelae of cerebral infarction: Secondary | ICD-10-CM | POA: Diagnosis not present

## 2022-10-09 DIAGNOSIS — Z741 Need for assistance with personal care: Secondary | ICD-10-CM | POA: Diagnosis not present

## 2022-10-09 DIAGNOSIS — J9601 Acute respiratory failure with hypoxia: Secondary | ICD-10-CM | POA: Diagnosis not present

## 2022-10-09 DIAGNOSIS — E46 Unspecified protein-calorie malnutrition: Secondary | ICD-10-CM | POA: Diagnosis not present

## 2022-10-09 DIAGNOSIS — M6281 Muscle weakness (generalized): Secondary | ICD-10-CM | POA: Diagnosis not present

## 2022-10-09 DIAGNOSIS — R1310 Dysphagia, unspecified: Secondary | ICD-10-CM | POA: Diagnosis not present

## 2022-10-09 DIAGNOSIS — R5381 Other malaise: Secondary | ICD-10-CM | POA: Diagnosis not present

## 2022-10-09 DIAGNOSIS — Z950 Presence of cardiac pacemaker: Secondary | ICD-10-CM | POA: Diagnosis not present

## 2022-10-10 DIAGNOSIS — Z741 Need for assistance with personal care: Secondary | ICD-10-CM | POA: Diagnosis not present

## 2022-10-10 DIAGNOSIS — I6930 Unspecified sequelae of cerebral infarction: Secondary | ICD-10-CM | POA: Diagnosis not present

## 2022-10-10 DIAGNOSIS — M6281 Muscle weakness (generalized): Secondary | ICD-10-CM | POA: Diagnosis not present

## 2022-10-10 DIAGNOSIS — M6259 Muscle wasting and atrophy, not elsewhere classified, multiple sites: Secondary | ICD-10-CM | POA: Diagnosis not present

## 2022-10-10 DIAGNOSIS — R1310 Dysphagia, unspecified: Secondary | ICD-10-CM | POA: Diagnosis not present

## 2022-10-10 DIAGNOSIS — J9601 Acute respiratory failure with hypoxia: Secondary | ICD-10-CM | POA: Diagnosis not present

## 2022-10-10 DIAGNOSIS — R1311 Dysphagia, oral phase: Secondary | ICD-10-CM | POA: Diagnosis not present

## 2022-10-10 DIAGNOSIS — E039 Hypothyroidism, unspecified: Secondary | ICD-10-CM | POA: Diagnosis not present

## 2022-10-10 DIAGNOSIS — Z950 Presence of cardiac pacemaker: Secondary | ICD-10-CM | POA: Diagnosis not present

## 2022-10-10 DIAGNOSIS — E46 Unspecified protein-calorie malnutrition: Secondary | ICD-10-CM | POA: Diagnosis not present

## 2022-10-10 DIAGNOSIS — E785 Hyperlipidemia, unspecified: Secondary | ICD-10-CM | POA: Diagnosis not present

## 2022-10-10 DIAGNOSIS — I10 Essential (primary) hypertension: Secondary | ICD-10-CM | POA: Diagnosis not present

## 2022-10-10 DIAGNOSIS — R5381 Other malaise: Secondary | ICD-10-CM | POA: Diagnosis not present

## 2022-10-12 DIAGNOSIS — R1311 Dysphagia, oral phase: Secondary | ICD-10-CM | POA: Diagnosis not present

## 2022-10-12 DIAGNOSIS — I10 Essential (primary) hypertension: Secondary | ICD-10-CM | POA: Diagnosis not present

## 2022-10-12 DIAGNOSIS — J9601 Acute respiratory failure with hypoxia: Secondary | ICD-10-CM | POA: Diagnosis not present

## 2022-10-12 DIAGNOSIS — M6259 Muscle wasting and atrophy, not elsewhere classified, multiple sites: Secondary | ICD-10-CM | POA: Diagnosis not present

## 2022-10-12 DIAGNOSIS — Z950 Presence of cardiac pacemaker: Secondary | ICD-10-CM | POA: Diagnosis not present

## 2022-10-12 DIAGNOSIS — E46 Unspecified protein-calorie malnutrition: Secondary | ICD-10-CM | POA: Diagnosis not present

## 2022-10-12 DIAGNOSIS — E785 Hyperlipidemia, unspecified: Secondary | ICD-10-CM | POA: Diagnosis not present

## 2022-10-12 DIAGNOSIS — R1310 Dysphagia, unspecified: Secondary | ICD-10-CM | POA: Diagnosis not present

## 2022-10-12 DIAGNOSIS — R5381 Other malaise: Secondary | ICD-10-CM | POA: Diagnosis not present

## 2022-10-12 DIAGNOSIS — M6281 Muscle weakness (generalized): Secondary | ICD-10-CM | POA: Diagnosis not present

## 2022-10-12 DIAGNOSIS — E039 Hypothyroidism, unspecified: Secondary | ICD-10-CM | POA: Diagnosis not present

## 2022-10-12 DIAGNOSIS — I6930 Unspecified sequelae of cerebral infarction: Secondary | ICD-10-CM | POA: Diagnosis not present

## 2022-10-12 DIAGNOSIS — Z741 Need for assistance with personal care: Secondary | ICD-10-CM | POA: Diagnosis not present

## 2022-10-13 DIAGNOSIS — R1311 Dysphagia, oral phase: Secondary | ICD-10-CM | POA: Diagnosis not present

## 2022-10-13 DIAGNOSIS — E785 Hyperlipidemia, unspecified: Secondary | ICD-10-CM | POA: Diagnosis not present

## 2022-10-13 DIAGNOSIS — R1310 Dysphagia, unspecified: Secondary | ICD-10-CM | POA: Diagnosis not present

## 2022-10-13 DIAGNOSIS — E039 Hypothyroidism, unspecified: Secondary | ICD-10-CM | POA: Diagnosis not present

## 2022-10-13 DIAGNOSIS — Z950 Presence of cardiac pacemaker: Secondary | ICD-10-CM | POA: Diagnosis not present

## 2022-10-13 DIAGNOSIS — Z741 Need for assistance with personal care: Secondary | ICD-10-CM | POA: Diagnosis not present

## 2022-10-13 DIAGNOSIS — M6259 Muscle wasting and atrophy, not elsewhere classified, multiple sites: Secondary | ICD-10-CM | POA: Diagnosis not present

## 2022-10-13 DIAGNOSIS — R5381 Other malaise: Secondary | ICD-10-CM | POA: Diagnosis not present

## 2022-10-13 DIAGNOSIS — E46 Unspecified protein-calorie malnutrition: Secondary | ICD-10-CM | POA: Diagnosis not present

## 2022-10-13 DIAGNOSIS — J9601 Acute respiratory failure with hypoxia: Secondary | ICD-10-CM | POA: Diagnosis not present

## 2022-10-13 DIAGNOSIS — I6930 Unspecified sequelae of cerebral infarction: Secondary | ICD-10-CM | POA: Diagnosis not present

## 2022-10-13 DIAGNOSIS — I10 Essential (primary) hypertension: Secondary | ICD-10-CM | POA: Diagnosis not present

## 2022-10-13 DIAGNOSIS — M6281 Muscle weakness (generalized): Secondary | ICD-10-CM | POA: Diagnosis not present

## 2022-10-15 DIAGNOSIS — R1310 Dysphagia, unspecified: Secondary | ICD-10-CM | POA: Diagnosis not present

## 2022-10-15 DIAGNOSIS — Z950 Presence of cardiac pacemaker: Secondary | ICD-10-CM | POA: Diagnosis not present

## 2022-10-15 DIAGNOSIS — E785 Hyperlipidemia, unspecified: Secondary | ICD-10-CM | POA: Diagnosis not present

## 2022-10-15 DIAGNOSIS — R1311 Dysphagia, oral phase: Secondary | ICD-10-CM | POA: Diagnosis not present

## 2022-10-15 DIAGNOSIS — M6281 Muscle weakness (generalized): Secondary | ICD-10-CM | POA: Diagnosis not present

## 2022-10-15 DIAGNOSIS — Z741 Need for assistance with personal care: Secondary | ICD-10-CM | POA: Diagnosis not present

## 2022-10-15 DIAGNOSIS — J9601 Acute respiratory failure with hypoxia: Secondary | ICD-10-CM | POA: Diagnosis not present

## 2022-10-15 DIAGNOSIS — M6259 Muscle wasting and atrophy, not elsewhere classified, multiple sites: Secondary | ICD-10-CM | POA: Diagnosis not present

## 2022-10-15 DIAGNOSIS — I10 Essential (primary) hypertension: Secondary | ICD-10-CM | POA: Diagnosis not present

## 2022-10-15 DIAGNOSIS — I6930 Unspecified sequelae of cerebral infarction: Secondary | ICD-10-CM | POA: Diagnosis not present

## 2022-10-15 DIAGNOSIS — R5381 Other malaise: Secondary | ICD-10-CM | POA: Diagnosis not present

## 2022-10-15 DIAGNOSIS — E46 Unspecified protein-calorie malnutrition: Secondary | ICD-10-CM | POA: Diagnosis not present

## 2022-10-15 DIAGNOSIS — E039 Hypothyroidism, unspecified: Secondary | ICD-10-CM | POA: Diagnosis not present

## 2022-10-17 DIAGNOSIS — R5381 Other malaise: Secondary | ICD-10-CM | POA: Diagnosis not present

## 2022-10-17 DIAGNOSIS — R1311 Dysphagia, oral phase: Secondary | ICD-10-CM | POA: Diagnosis not present

## 2022-10-17 DIAGNOSIS — Z950 Presence of cardiac pacemaker: Secondary | ICD-10-CM | POA: Diagnosis not present

## 2022-10-17 DIAGNOSIS — I10 Essential (primary) hypertension: Secondary | ICD-10-CM | POA: Diagnosis not present

## 2022-10-17 DIAGNOSIS — E039 Hypothyroidism, unspecified: Secondary | ICD-10-CM | POA: Diagnosis not present

## 2022-10-17 DIAGNOSIS — E46 Unspecified protein-calorie malnutrition: Secondary | ICD-10-CM | POA: Diagnosis not present

## 2022-10-17 DIAGNOSIS — R1310 Dysphagia, unspecified: Secondary | ICD-10-CM | POA: Diagnosis not present

## 2022-10-17 DIAGNOSIS — Z741 Need for assistance with personal care: Secondary | ICD-10-CM | POA: Diagnosis not present

## 2022-10-17 DIAGNOSIS — E785 Hyperlipidemia, unspecified: Secondary | ICD-10-CM | POA: Diagnosis not present

## 2022-10-17 DIAGNOSIS — M6281 Muscle weakness (generalized): Secondary | ICD-10-CM | POA: Diagnosis not present

## 2022-10-17 DIAGNOSIS — M6259 Muscle wasting and atrophy, not elsewhere classified, multiple sites: Secondary | ICD-10-CM | POA: Diagnosis not present

## 2022-10-17 DIAGNOSIS — J9601 Acute respiratory failure with hypoxia: Secondary | ICD-10-CM | POA: Diagnosis not present

## 2022-10-17 DIAGNOSIS — I6930 Unspecified sequelae of cerebral infarction: Secondary | ICD-10-CM | POA: Diagnosis not present

## 2022-10-27 DIAGNOSIS — I69311 Memory deficit following cerebral infarction: Secondary | ICD-10-CM | POA: Diagnosis not present

## 2022-10-27 DIAGNOSIS — F01511 Vascular dementia, unspecified severity, with agitation: Secondary | ICD-10-CM | POA: Diagnosis not present

## 2022-10-27 DIAGNOSIS — N390 Urinary tract infection, site not specified: Secondary | ICD-10-CM | POA: Diagnosis not present

## 2022-10-27 DIAGNOSIS — R451 Restlessness and agitation: Secondary | ICD-10-CM | POA: Diagnosis not present

## 2022-11-06 DIAGNOSIS — E785 Hyperlipidemia, unspecified: Secondary | ICD-10-CM | POA: Diagnosis not present

## 2022-11-06 DIAGNOSIS — E039 Hypothyroidism, unspecified: Secondary | ICD-10-CM | POA: Diagnosis not present

## 2022-11-06 DIAGNOSIS — F03918 Unspecified dementia, unspecified severity, with other behavioral disturbance: Secondary | ICD-10-CM | POA: Diagnosis not present

## 2022-11-20 DIAGNOSIS — Z45010 Encounter for checking and testing of cardiac pacemaker pulse generator [battery]: Secondary | ICD-10-CM | POA: Diagnosis not present

## 2022-11-20 DIAGNOSIS — R55 Syncope and collapse: Secondary | ICD-10-CM | POA: Diagnosis not present

## 2022-11-24 DIAGNOSIS — I69311 Memory deficit following cerebral infarction: Secondary | ICD-10-CM | POA: Diagnosis not present

## 2022-11-24 DIAGNOSIS — F01511 Vascular dementia, unspecified severity, with agitation: Secondary | ICD-10-CM | POA: Diagnosis not present

## 2022-12-08 DIAGNOSIS — I700 Atherosclerosis of aorta: Secondary | ICD-10-CM | POA: Diagnosis not present

## 2022-12-08 DIAGNOSIS — E785 Hyperlipidemia, unspecified: Secondary | ICD-10-CM | POA: Diagnosis not present

## 2022-12-08 DIAGNOSIS — E039 Hypothyroidism, unspecified: Secondary | ICD-10-CM | POA: Diagnosis not present

## 2022-12-08 DIAGNOSIS — F03918 Unspecified dementia, unspecified severity, with other behavioral disturbance: Secondary | ICD-10-CM | POA: Diagnosis not present

## 2022-12-10 DIAGNOSIS — Z79899 Other long term (current) drug therapy: Secondary | ICD-10-CM | POA: Diagnosis not present

## 2022-12-10 DIAGNOSIS — E785 Hyperlipidemia, unspecified: Secondary | ICD-10-CM | POA: Diagnosis not present

## 2022-12-10 DIAGNOSIS — E039 Hypothyroidism, unspecified: Secondary | ICD-10-CM | POA: Diagnosis not present

## 2022-12-18 DIAGNOSIS — R300 Dysuria: Secondary | ICD-10-CM | POA: Diagnosis not present

## 2022-12-21 DIAGNOSIS — J9620 Acute and chronic respiratory failure, unspecified whether with hypoxia or hypercapnia: Secondary | ICD-10-CM | POA: Diagnosis not present

## 2022-12-21 DIAGNOSIS — F01511 Vascular dementia, unspecified severity, with agitation: Secondary | ICD-10-CM | POA: Diagnosis not present

## 2022-12-22 DIAGNOSIS — N390 Urinary tract infection, site not specified: Secondary | ICD-10-CM | POA: Diagnosis not present

## 2022-12-24 DIAGNOSIS — F01511 Vascular dementia, unspecified severity, with agitation: Secondary | ICD-10-CM | POA: Diagnosis not present

## 2022-12-24 DIAGNOSIS — I69311 Memory deficit following cerebral infarction: Secondary | ICD-10-CM | POA: Diagnosis not present

## 2022-12-24 DIAGNOSIS — R451 Restlessness and agitation: Secondary | ICD-10-CM | POA: Diagnosis not present

## 2023-01-08 DIAGNOSIS — E039 Hypothyroidism, unspecified: Secondary | ICD-10-CM | POA: Diagnosis not present

## 2023-01-08 DIAGNOSIS — R1311 Dysphagia, oral phase: Secondary | ICD-10-CM | POA: Diagnosis not present

## 2023-01-08 DIAGNOSIS — E46 Unspecified protein-calorie malnutrition: Secondary | ICD-10-CM | POA: Diagnosis not present

## 2023-01-08 DIAGNOSIS — Z950 Presence of cardiac pacemaker: Secondary | ICD-10-CM | POA: Diagnosis not present

## 2023-01-08 DIAGNOSIS — R5381 Other malaise: Secondary | ICD-10-CM | POA: Diagnosis not present

## 2023-01-08 DIAGNOSIS — I6930 Unspecified sequelae of cerebral infarction: Secondary | ICD-10-CM | POA: Diagnosis not present

## 2023-01-08 DIAGNOSIS — J9601 Acute respiratory failure with hypoxia: Secondary | ICD-10-CM | POA: Diagnosis not present

## 2023-01-08 DIAGNOSIS — M6281 Muscle weakness (generalized): Secondary | ICD-10-CM | POA: Diagnosis not present

## 2023-01-08 DIAGNOSIS — M6259 Muscle wasting and atrophy, not elsewhere classified, multiple sites: Secondary | ICD-10-CM | POA: Diagnosis not present

## 2023-01-08 DIAGNOSIS — I10 Essential (primary) hypertension: Secondary | ICD-10-CM | POA: Diagnosis not present

## 2023-01-08 DIAGNOSIS — Z741 Need for assistance with personal care: Secondary | ICD-10-CM | POA: Diagnosis not present

## 2023-01-08 DIAGNOSIS — R1310 Dysphagia, unspecified: Secondary | ICD-10-CM | POA: Diagnosis not present

## 2023-01-08 DIAGNOSIS — Z48812 Encounter for surgical aftercare following surgery on the circulatory system: Secondary | ICD-10-CM | POA: Diagnosis not present

## 2023-01-08 DIAGNOSIS — E785 Hyperlipidemia, unspecified: Secondary | ICD-10-CM | POA: Diagnosis not present

## 2023-01-11 DIAGNOSIS — M6281 Muscle weakness (generalized): Secondary | ICD-10-CM | POA: Diagnosis not present

## 2023-01-11 DIAGNOSIS — E039 Hypothyroidism, unspecified: Secondary | ICD-10-CM | POA: Diagnosis not present

## 2023-01-11 DIAGNOSIS — E785 Hyperlipidemia, unspecified: Secondary | ICD-10-CM | POA: Diagnosis not present

## 2023-01-11 DIAGNOSIS — R1310 Dysphagia, unspecified: Secondary | ICD-10-CM | POA: Diagnosis not present

## 2023-01-11 DIAGNOSIS — Z741 Need for assistance with personal care: Secondary | ICD-10-CM | POA: Diagnosis not present

## 2023-01-11 DIAGNOSIS — R5381 Other malaise: Secondary | ICD-10-CM | POA: Diagnosis not present

## 2023-01-11 DIAGNOSIS — I6930 Unspecified sequelae of cerebral infarction: Secondary | ICD-10-CM | POA: Diagnosis not present

## 2023-01-11 DIAGNOSIS — J9601 Acute respiratory failure with hypoxia: Secondary | ICD-10-CM | POA: Diagnosis not present

## 2023-01-11 DIAGNOSIS — R1311 Dysphagia, oral phase: Secondary | ICD-10-CM | POA: Diagnosis not present

## 2023-01-11 DIAGNOSIS — E46 Unspecified protein-calorie malnutrition: Secondary | ICD-10-CM | POA: Diagnosis not present

## 2023-01-11 DIAGNOSIS — Z950 Presence of cardiac pacemaker: Secondary | ICD-10-CM | POA: Diagnosis not present

## 2023-01-11 DIAGNOSIS — Z48812 Encounter for surgical aftercare following surgery on the circulatory system: Secondary | ICD-10-CM | POA: Diagnosis not present

## 2023-01-11 DIAGNOSIS — M6259 Muscle wasting and atrophy, not elsewhere classified, multiple sites: Secondary | ICD-10-CM | POA: Diagnosis not present

## 2023-01-11 DIAGNOSIS — I10 Essential (primary) hypertension: Secondary | ICD-10-CM | POA: Diagnosis not present

## 2023-01-12 DIAGNOSIS — R1310 Dysphagia, unspecified: Secondary | ICD-10-CM | POA: Diagnosis not present

## 2023-01-12 DIAGNOSIS — Z741 Need for assistance with personal care: Secondary | ICD-10-CM | POA: Diagnosis not present

## 2023-01-12 DIAGNOSIS — R5381 Other malaise: Secondary | ICD-10-CM | POA: Diagnosis not present

## 2023-01-12 DIAGNOSIS — E039 Hypothyroidism, unspecified: Secondary | ICD-10-CM | POA: Diagnosis not present

## 2023-01-12 DIAGNOSIS — M6281 Muscle weakness (generalized): Secondary | ICD-10-CM | POA: Diagnosis not present

## 2023-01-12 DIAGNOSIS — Z48812 Encounter for surgical aftercare following surgery on the circulatory system: Secondary | ICD-10-CM | POA: Diagnosis not present

## 2023-01-12 DIAGNOSIS — I10 Essential (primary) hypertension: Secondary | ICD-10-CM | POA: Diagnosis not present

## 2023-01-12 DIAGNOSIS — Z950 Presence of cardiac pacemaker: Secondary | ICD-10-CM | POA: Diagnosis not present

## 2023-01-12 DIAGNOSIS — I6930 Unspecified sequelae of cerebral infarction: Secondary | ICD-10-CM | POA: Diagnosis not present

## 2023-01-12 DIAGNOSIS — E785 Hyperlipidemia, unspecified: Secondary | ICD-10-CM | POA: Diagnosis not present

## 2023-01-12 DIAGNOSIS — R1311 Dysphagia, oral phase: Secondary | ICD-10-CM | POA: Diagnosis not present

## 2023-01-12 DIAGNOSIS — E46 Unspecified protein-calorie malnutrition: Secondary | ICD-10-CM | POA: Diagnosis not present

## 2023-01-12 DIAGNOSIS — M6259 Muscle wasting and atrophy, not elsewhere classified, multiple sites: Secondary | ICD-10-CM | POA: Diagnosis not present

## 2023-01-12 DIAGNOSIS — J9601 Acute respiratory failure with hypoxia: Secondary | ICD-10-CM | POA: Diagnosis not present

## 2023-01-18 DIAGNOSIS — Z741 Need for assistance with personal care: Secondary | ICD-10-CM | POA: Diagnosis not present

## 2023-01-18 DIAGNOSIS — M6259 Muscle wasting and atrophy, not elsewhere classified, multiple sites: Secondary | ICD-10-CM | POA: Diagnosis not present

## 2023-01-18 DIAGNOSIS — E785 Hyperlipidemia, unspecified: Secondary | ICD-10-CM | POA: Diagnosis not present

## 2023-01-18 DIAGNOSIS — R5381 Other malaise: Secondary | ICD-10-CM | POA: Diagnosis not present

## 2023-01-18 DIAGNOSIS — R1310 Dysphagia, unspecified: Secondary | ICD-10-CM | POA: Diagnosis not present

## 2023-01-18 DIAGNOSIS — R1311 Dysphagia, oral phase: Secondary | ICD-10-CM | POA: Diagnosis not present

## 2023-01-18 DIAGNOSIS — M6281 Muscle weakness (generalized): Secondary | ICD-10-CM | POA: Diagnosis not present

## 2023-01-18 DIAGNOSIS — J9601 Acute respiratory failure with hypoxia: Secondary | ICD-10-CM | POA: Diagnosis not present

## 2023-01-18 DIAGNOSIS — Z950 Presence of cardiac pacemaker: Secondary | ICD-10-CM | POA: Diagnosis not present

## 2023-01-18 DIAGNOSIS — I6930 Unspecified sequelae of cerebral infarction: Secondary | ICD-10-CM | POA: Diagnosis not present

## 2023-01-18 DIAGNOSIS — E46 Unspecified protein-calorie malnutrition: Secondary | ICD-10-CM | POA: Diagnosis not present

## 2023-01-18 DIAGNOSIS — I10 Essential (primary) hypertension: Secondary | ICD-10-CM | POA: Diagnosis not present

## 2023-01-18 DIAGNOSIS — E039 Hypothyroidism, unspecified: Secondary | ICD-10-CM | POA: Diagnosis not present

## 2023-01-18 DIAGNOSIS — Z48812 Encounter for surgical aftercare following surgery on the circulatory system: Secondary | ICD-10-CM | POA: Diagnosis not present

## 2023-01-20 DIAGNOSIS — R1311 Dysphagia, oral phase: Secondary | ICD-10-CM | POA: Diagnosis not present

## 2023-01-20 DIAGNOSIS — I10 Essential (primary) hypertension: Secondary | ICD-10-CM | POA: Diagnosis not present

## 2023-01-20 DIAGNOSIS — Z741 Need for assistance with personal care: Secondary | ICD-10-CM | POA: Diagnosis not present

## 2023-01-20 DIAGNOSIS — Z48812 Encounter for surgical aftercare following surgery on the circulatory system: Secondary | ICD-10-CM | POA: Diagnosis not present

## 2023-01-20 DIAGNOSIS — R5381 Other malaise: Secondary | ICD-10-CM | POA: Diagnosis not present

## 2023-01-20 DIAGNOSIS — R1310 Dysphagia, unspecified: Secondary | ICD-10-CM | POA: Diagnosis not present

## 2023-01-20 DIAGNOSIS — I6930 Unspecified sequelae of cerebral infarction: Secondary | ICD-10-CM | POA: Diagnosis not present

## 2023-01-20 DIAGNOSIS — Z950 Presence of cardiac pacemaker: Secondary | ICD-10-CM | POA: Diagnosis not present

## 2023-01-20 DIAGNOSIS — E46 Unspecified protein-calorie malnutrition: Secondary | ICD-10-CM | POA: Diagnosis not present

## 2023-01-20 DIAGNOSIS — M6259 Muscle wasting and atrophy, not elsewhere classified, multiple sites: Secondary | ICD-10-CM | POA: Diagnosis not present

## 2023-01-20 DIAGNOSIS — E039 Hypothyroidism, unspecified: Secondary | ICD-10-CM | POA: Diagnosis not present

## 2023-01-20 DIAGNOSIS — M6281 Muscle weakness (generalized): Secondary | ICD-10-CM | POA: Diagnosis not present

## 2023-01-20 DIAGNOSIS — E785 Hyperlipidemia, unspecified: Secondary | ICD-10-CM | POA: Diagnosis not present

## 2023-01-20 DIAGNOSIS — J9601 Acute respiratory failure with hypoxia: Secondary | ICD-10-CM | POA: Diagnosis not present

## 2023-01-21 DIAGNOSIS — I10 Essential (primary) hypertension: Secondary | ICD-10-CM | POA: Diagnosis not present

## 2023-01-21 DIAGNOSIS — M6281 Muscle weakness (generalized): Secondary | ICD-10-CM | POA: Diagnosis not present

## 2023-01-21 DIAGNOSIS — J9601 Acute respiratory failure with hypoxia: Secondary | ICD-10-CM | POA: Diagnosis not present

## 2023-01-21 DIAGNOSIS — R1310 Dysphagia, unspecified: Secondary | ICD-10-CM | POA: Diagnosis not present

## 2023-01-21 DIAGNOSIS — Z48812 Encounter for surgical aftercare following surgery on the circulatory system: Secondary | ICD-10-CM | POA: Diagnosis not present

## 2023-01-21 DIAGNOSIS — R1311 Dysphagia, oral phase: Secondary | ICD-10-CM | POA: Diagnosis not present

## 2023-01-21 DIAGNOSIS — Z741 Need for assistance with personal care: Secondary | ICD-10-CM | POA: Diagnosis not present

## 2023-01-21 DIAGNOSIS — I6930 Unspecified sequelae of cerebral infarction: Secondary | ICD-10-CM | POA: Diagnosis not present

## 2023-01-21 DIAGNOSIS — Z950 Presence of cardiac pacemaker: Secondary | ICD-10-CM | POA: Diagnosis not present

## 2023-01-21 DIAGNOSIS — E039 Hypothyroidism, unspecified: Secondary | ICD-10-CM | POA: Diagnosis not present

## 2023-01-21 DIAGNOSIS — R5381 Other malaise: Secondary | ICD-10-CM | POA: Diagnosis not present

## 2023-01-21 DIAGNOSIS — E46 Unspecified protein-calorie malnutrition: Secondary | ICD-10-CM | POA: Diagnosis not present

## 2023-01-21 DIAGNOSIS — M6259 Muscle wasting and atrophy, not elsewhere classified, multiple sites: Secondary | ICD-10-CM | POA: Diagnosis not present

## 2023-01-21 DIAGNOSIS — E785 Hyperlipidemia, unspecified: Secondary | ICD-10-CM | POA: Diagnosis not present

## 2023-01-24 DIAGNOSIS — Z48812 Encounter for surgical aftercare following surgery on the circulatory system: Secondary | ICD-10-CM | POA: Diagnosis not present

## 2023-01-24 DIAGNOSIS — M6281 Muscle weakness (generalized): Secondary | ICD-10-CM | POA: Diagnosis not present

## 2023-01-24 DIAGNOSIS — R1311 Dysphagia, oral phase: Secondary | ICD-10-CM | POA: Diagnosis not present

## 2023-01-24 DIAGNOSIS — E039 Hypothyroidism, unspecified: Secondary | ICD-10-CM | POA: Diagnosis not present

## 2023-01-24 DIAGNOSIS — I6930 Unspecified sequelae of cerebral infarction: Secondary | ICD-10-CM | POA: Diagnosis not present

## 2023-01-24 DIAGNOSIS — R5381 Other malaise: Secondary | ICD-10-CM | POA: Diagnosis not present

## 2023-01-24 DIAGNOSIS — E785 Hyperlipidemia, unspecified: Secondary | ICD-10-CM | POA: Diagnosis not present

## 2023-01-24 DIAGNOSIS — Z950 Presence of cardiac pacemaker: Secondary | ICD-10-CM | POA: Diagnosis not present

## 2023-01-24 DIAGNOSIS — I10 Essential (primary) hypertension: Secondary | ICD-10-CM | POA: Diagnosis not present

## 2023-01-24 DIAGNOSIS — J9601 Acute respiratory failure with hypoxia: Secondary | ICD-10-CM | POA: Diagnosis not present

## 2023-01-24 DIAGNOSIS — M6259 Muscle wasting and atrophy, not elsewhere classified, multiple sites: Secondary | ICD-10-CM | POA: Diagnosis not present

## 2023-01-24 DIAGNOSIS — R1310 Dysphagia, unspecified: Secondary | ICD-10-CM | POA: Diagnosis not present

## 2023-01-24 DIAGNOSIS — E46 Unspecified protein-calorie malnutrition: Secondary | ICD-10-CM | POA: Diagnosis not present

## 2023-01-24 DIAGNOSIS — Z741 Need for assistance with personal care: Secondary | ICD-10-CM | POA: Diagnosis not present

## 2023-01-25 DIAGNOSIS — Z741 Need for assistance with personal care: Secondary | ICD-10-CM | POA: Diagnosis not present

## 2023-01-25 DIAGNOSIS — I6930 Unspecified sequelae of cerebral infarction: Secondary | ICD-10-CM | POA: Diagnosis not present

## 2023-01-25 DIAGNOSIS — R2681 Unsteadiness on feet: Secondary | ICD-10-CM | POA: Diagnosis not present

## 2023-01-25 DIAGNOSIS — E785 Hyperlipidemia, unspecified: Secondary | ICD-10-CM | POA: Diagnosis not present

## 2023-01-25 DIAGNOSIS — E46 Unspecified protein-calorie malnutrition: Secondary | ICD-10-CM | POA: Diagnosis not present

## 2023-01-25 DIAGNOSIS — J9601 Acute respiratory failure with hypoxia: Secondary | ICD-10-CM | POA: Diagnosis not present

## 2023-01-25 DIAGNOSIS — M6281 Muscle weakness (generalized): Secondary | ICD-10-CM | POA: Diagnosis not present

## 2023-01-25 DIAGNOSIS — Z9181 History of falling: Secondary | ICD-10-CM | POA: Diagnosis not present

## 2023-01-25 DIAGNOSIS — R1310 Dysphagia, unspecified: Secondary | ICD-10-CM | POA: Diagnosis not present

## 2023-01-25 DIAGNOSIS — M6259 Muscle wasting and atrophy, not elsewhere classified, multiple sites: Secondary | ICD-10-CM | POA: Diagnosis not present

## 2023-01-25 DIAGNOSIS — R5381 Other malaise: Secondary | ICD-10-CM | POA: Diagnosis not present

## 2023-01-25 DIAGNOSIS — E039 Hypothyroidism, unspecified: Secondary | ICD-10-CM | POA: Diagnosis not present

## 2023-01-25 DIAGNOSIS — I10 Essential (primary) hypertension: Secondary | ICD-10-CM | POA: Diagnosis not present

## 2023-01-26 DIAGNOSIS — R5381 Other malaise: Secondary | ICD-10-CM | POA: Diagnosis not present

## 2023-01-26 DIAGNOSIS — E46 Unspecified protein-calorie malnutrition: Secondary | ICD-10-CM | POA: Diagnosis not present

## 2023-01-26 DIAGNOSIS — I10 Essential (primary) hypertension: Secondary | ICD-10-CM | POA: Diagnosis not present

## 2023-01-26 DIAGNOSIS — E785 Hyperlipidemia, unspecified: Secondary | ICD-10-CM | POA: Diagnosis not present

## 2023-01-26 DIAGNOSIS — E039 Hypothyroidism, unspecified: Secondary | ICD-10-CM | POA: Diagnosis not present

## 2023-01-26 DIAGNOSIS — M6259 Muscle wasting and atrophy, not elsewhere classified, multiple sites: Secondary | ICD-10-CM | POA: Diagnosis not present

## 2023-01-26 DIAGNOSIS — R1310 Dysphagia, unspecified: Secondary | ICD-10-CM | POA: Diagnosis not present

## 2023-01-26 DIAGNOSIS — J9601 Acute respiratory failure with hypoxia: Secondary | ICD-10-CM | POA: Diagnosis not present

## 2023-01-26 DIAGNOSIS — R2681 Unsteadiness on feet: Secondary | ICD-10-CM | POA: Diagnosis not present

## 2023-01-26 DIAGNOSIS — I6930 Unspecified sequelae of cerebral infarction: Secondary | ICD-10-CM | POA: Diagnosis not present

## 2023-01-26 DIAGNOSIS — Z9181 History of falling: Secondary | ICD-10-CM | POA: Diagnosis not present

## 2023-01-26 DIAGNOSIS — M6281 Muscle weakness (generalized): Secondary | ICD-10-CM | POA: Diagnosis not present

## 2023-01-26 DIAGNOSIS — Z741 Need for assistance with personal care: Secondary | ICD-10-CM | POA: Diagnosis not present

## 2023-01-27 DIAGNOSIS — R2681 Unsteadiness on feet: Secondary | ICD-10-CM | POA: Diagnosis not present

## 2023-01-27 DIAGNOSIS — J9601 Acute respiratory failure with hypoxia: Secondary | ICD-10-CM | POA: Diagnosis not present

## 2023-01-27 DIAGNOSIS — I6930 Unspecified sequelae of cerebral infarction: Secondary | ICD-10-CM | POA: Diagnosis not present

## 2023-01-27 DIAGNOSIS — M6259 Muscle wasting and atrophy, not elsewhere classified, multiple sites: Secondary | ICD-10-CM | POA: Diagnosis not present

## 2023-01-27 DIAGNOSIS — E039 Hypothyroidism, unspecified: Secondary | ICD-10-CM | POA: Diagnosis not present

## 2023-01-27 DIAGNOSIS — E46 Unspecified protein-calorie malnutrition: Secondary | ICD-10-CM | POA: Diagnosis not present

## 2023-01-27 DIAGNOSIS — Z9181 History of falling: Secondary | ICD-10-CM | POA: Diagnosis not present

## 2023-01-27 DIAGNOSIS — E785 Hyperlipidemia, unspecified: Secondary | ICD-10-CM | POA: Diagnosis not present

## 2023-01-27 DIAGNOSIS — R1310 Dysphagia, unspecified: Secondary | ICD-10-CM | POA: Diagnosis not present

## 2023-01-27 DIAGNOSIS — I10 Essential (primary) hypertension: Secondary | ICD-10-CM | POA: Diagnosis not present

## 2023-01-27 DIAGNOSIS — M6281 Muscle weakness (generalized): Secondary | ICD-10-CM | POA: Diagnosis not present

## 2023-01-27 DIAGNOSIS — Z741 Need for assistance with personal care: Secondary | ICD-10-CM | POA: Diagnosis not present

## 2023-01-27 DIAGNOSIS — R5381 Other malaise: Secondary | ICD-10-CM | POA: Diagnosis not present

## 2023-01-29 DIAGNOSIS — R1310 Dysphagia, unspecified: Secondary | ICD-10-CM | POA: Diagnosis not present

## 2023-01-29 DIAGNOSIS — I6930 Unspecified sequelae of cerebral infarction: Secondary | ICD-10-CM | POA: Diagnosis not present

## 2023-01-29 DIAGNOSIS — R5381 Other malaise: Secondary | ICD-10-CM | POA: Diagnosis not present

## 2023-01-29 DIAGNOSIS — J9601 Acute respiratory failure with hypoxia: Secondary | ICD-10-CM | POA: Diagnosis not present

## 2023-01-29 DIAGNOSIS — M6259 Muscle wasting and atrophy, not elsewhere classified, multiple sites: Secondary | ICD-10-CM | POA: Diagnosis not present

## 2023-01-29 DIAGNOSIS — E039 Hypothyroidism, unspecified: Secondary | ICD-10-CM | POA: Diagnosis not present

## 2023-01-29 DIAGNOSIS — Z741 Need for assistance with personal care: Secondary | ICD-10-CM | POA: Diagnosis not present

## 2023-01-29 DIAGNOSIS — E46 Unspecified protein-calorie malnutrition: Secondary | ICD-10-CM | POA: Diagnosis not present

## 2023-01-29 DIAGNOSIS — R2681 Unsteadiness on feet: Secondary | ICD-10-CM | POA: Diagnosis not present

## 2023-01-29 DIAGNOSIS — Z9181 History of falling: Secondary | ICD-10-CM | POA: Diagnosis not present

## 2023-01-29 DIAGNOSIS — I10 Essential (primary) hypertension: Secondary | ICD-10-CM | POA: Diagnosis not present

## 2023-01-29 DIAGNOSIS — E785 Hyperlipidemia, unspecified: Secondary | ICD-10-CM | POA: Diagnosis not present

## 2023-01-29 DIAGNOSIS — M6281 Muscle weakness (generalized): Secondary | ICD-10-CM | POA: Diagnosis not present

## 2023-01-31 DIAGNOSIS — E039 Hypothyroidism, unspecified: Secondary | ICD-10-CM | POA: Diagnosis not present

## 2023-01-31 DIAGNOSIS — I10 Essential (primary) hypertension: Secondary | ICD-10-CM | POA: Diagnosis not present

## 2023-01-31 DIAGNOSIS — R5381 Other malaise: Secondary | ICD-10-CM | POA: Diagnosis not present

## 2023-01-31 DIAGNOSIS — J9601 Acute respiratory failure with hypoxia: Secondary | ICD-10-CM | POA: Diagnosis not present

## 2023-01-31 DIAGNOSIS — Z9181 History of falling: Secondary | ICD-10-CM | POA: Diagnosis not present

## 2023-01-31 DIAGNOSIS — E785 Hyperlipidemia, unspecified: Secondary | ICD-10-CM | POA: Diagnosis not present

## 2023-01-31 DIAGNOSIS — E46 Unspecified protein-calorie malnutrition: Secondary | ICD-10-CM | POA: Diagnosis not present

## 2023-01-31 DIAGNOSIS — M6259 Muscle wasting and atrophy, not elsewhere classified, multiple sites: Secondary | ICD-10-CM | POA: Diagnosis not present

## 2023-01-31 DIAGNOSIS — M6281 Muscle weakness (generalized): Secondary | ICD-10-CM | POA: Diagnosis not present

## 2023-01-31 DIAGNOSIS — R2681 Unsteadiness on feet: Secondary | ICD-10-CM | POA: Diagnosis not present

## 2023-01-31 DIAGNOSIS — Z741 Need for assistance with personal care: Secondary | ICD-10-CM | POA: Diagnosis not present

## 2023-01-31 DIAGNOSIS — R1310 Dysphagia, unspecified: Secondary | ICD-10-CM | POA: Diagnosis not present

## 2023-01-31 DIAGNOSIS — I6930 Unspecified sequelae of cerebral infarction: Secondary | ICD-10-CM | POA: Diagnosis not present

## 2023-02-02 DIAGNOSIS — I6930 Unspecified sequelae of cerebral infarction: Secondary | ICD-10-CM | POA: Diagnosis not present

## 2023-02-02 DIAGNOSIS — I10 Essential (primary) hypertension: Secondary | ICD-10-CM | POA: Diagnosis not present

## 2023-02-02 DIAGNOSIS — Z741 Need for assistance with personal care: Secondary | ICD-10-CM | POA: Diagnosis not present

## 2023-02-02 DIAGNOSIS — M6259 Muscle wasting and atrophy, not elsewhere classified, multiple sites: Secondary | ICD-10-CM | POA: Diagnosis not present

## 2023-02-02 DIAGNOSIS — F32A Depression, unspecified: Secondary | ICD-10-CM | POA: Diagnosis not present

## 2023-02-02 DIAGNOSIS — F01511 Vascular dementia, unspecified severity, with agitation: Secondary | ICD-10-CM | POA: Diagnosis not present

## 2023-02-02 DIAGNOSIS — E785 Hyperlipidemia, unspecified: Secondary | ICD-10-CM | POA: Diagnosis not present

## 2023-02-02 DIAGNOSIS — E039 Hypothyroidism, unspecified: Secondary | ICD-10-CM | POA: Diagnosis not present

## 2023-02-02 DIAGNOSIS — M6281 Muscle weakness (generalized): Secondary | ICD-10-CM | POA: Diagnosis not present

## 2023-02-02 DIAGNOSIS — J9601 Acute respiratory failure with hypoxia: Secondary | ICD-10-CM | POA: Diagnosis not present

## 2023-02-02 DIAGNOSIS — R5381 Other malaise: Secondary | ICD-10-CM | POA: Diagnosis not present

## 2023-02-02 DIAGNOSIS — I69311 Memory deficit following cerebral infarction: Secondary | ICD-10-CM | POA: Diagnosis not present

## 2023-02-02 DIAGNOSIS — E46 Unspecified protein-calorie malnutrition: Secondary | ICD-10-CM | POA: Diagnosis not present

## 2023-02-02 DIAGNOSIS — Z9181 History of falling: Secondary | ICD-10-CM | POA: Diagnosis not present

## 2023-02-02 DIAGNOSIS — R2681 Unsteadiness on feet: Secondary | ICD-10-CM | POA: Diagnosis not present

## 2023-02-02 DIAGNOSIS — R1310 Dysphagia, unspecified: Secondary | ICD-10-CM | POA: Diagnosis not present

## 2023-02-04 DIAGNOSIS — R1310 Dysphagia, unspecified: Secondary | ICD-10-CM | POA: Diagnosis not present

## 2023-02-04 DIAGNOSIS — Z741 Need for assistance with personal care: Secondary | ICD-10-CM | POA: Diagnosis not present

## 2023-02-04 DIAGNOSIS — I6930 Unspecified sequelae of cerebral infarction: Secondary | ICD-10-CM | POA: Diagnosis not present

## 2023-02-04 DIAGNOSIS — R2681 Unsteadiness on feet: Secondary | ICD-10-CM | POA: Diagnosis not present

## 2023-02-04 DIAGNOSIS — M6259 Muscle wasting and atrophy, not elsewhere classified, multiple sites: Secondary | ICD-10-CM | POA: Diagnosis not present

## 2023-02-04 DIAGNOSIS — E785 Hyperlipidemia, unspecified: Secondary | ICD-10-CM | POA: Diagnosis not present

## 2023-02-04 DIAGNOSIS — Z9181 History of falling: Secondary | ICD-10-CM | POA: Diagnosis not present

## 2023-02-04 DIAGNOSIS — I10 Essential (primary) hypertension: Secondary | ICD-10-CM | POA: Diagnosis not present

## 2023-02-04 DIAGNOSIS — E039 Hypothyroidism, unspecified: Secondary | ICD-10-CM | POA: Diagnosis not present

## 2023-02-04 DIAGNOSIS — E46 Unspecified protein-calorie malnutrition: Secondary | ICD-10-CM | POA: Diagnosis not present

## 2023-02-04 DIAGNOSIS — M6281 Muscle weakness (generalized): Secondary | ICD-10-CM | POA: Diagnosis not present

## 2023-02-04 DIAGNOSIS — J9601 Acute respiratory failure with hypoxia: Secondary | ICD-10-CM | POA: Diagnosis not present

## 2023-02-04 DIAGNOSIS — R5381 Other malaise: Secondary | ICD-10-CM | POA: Diagnosis not present

## 2023-02-05 DIAGNOSIS — I10 Essential (primary) hypertension: Secondary | ICD-10-CM | POA: Diagnosis not present

## 2023-02-05 DIAGNOSIS — J9601 Acute respiratory failure with hypoxia: Secondary | ICD-10-CM | POA: Diagnosis not present

## 2023-02-05 DIAGNOSIS — R5381 Other malaise: Secondary | ICD-10-CM | POA: Diagnosis not present

## 2023-02-05 DIAGNOSIS — Z9181 History of falling: Secondary | ICD-10-CM | POA: Diagnosis not present

## 2023-02-05 DIAGNOSIS — M6259 Muscle wasting and atrophy, not elsewhere classified, multiple sites: Secondary | ICD-10-CM | POA: Diagnosis not present

## 2023-02-05 DIAGNOSIS — E039 Hypothyroidism, unspecified: Secondary | ICD-10-CM | POA: Diagnosis not present

## 2023-02-05 DIAGNOSIS — R2681 Unsteadiness on feet: Secondary | ICD-10-CM | POA: Diagnosis not present

## 2023-02-05 DIAGNOSIS — I6930 Unspecified sequelae of cerebral infarction: Secondary | ICD-10-CM | POA: Diagnosis not present

## 2023-02-05 DIAGNOSIS — Z741 Need for assistance with personal care: Secondary | ICD-10-CM | POA: Diagnosis not present

## 2023-02-05 DIAGNOSIS — M6281 Muscle weakness (generalized): Secondary | ICD-10-CM | POA: Diagnosis not present

## 2023-02-05 DIAGNOSIS — E785 Hyperlipidemia, unspecified: Secondary | ICD-10-CM | POA: Diagnosis not present

## 2023-02-05 DIAGNOSIS — R1310 Dysphagia, unspecified: Secondary | ICD-10-CM | POA: Diagnosis not present

## 2023-02-05 DIAGNOSIS — E46 Unspecified protein-calorie malnutrition: Secondary | ICD-10-CM | POA: Diagnosis not present

## 2023-02-08 DIAGNOSIS — M6281 Muscle weakness (generalized): Secondary | ICD-10-CM | POA: Diagnosis not present

## 2023-02-08 DIAGNOSIS — R5381 Other malaise: Secondary | ICD-10-CM | POA: Diagnosis not present

## 2023-02-08 DIAGNOSIS — I10 Essential (primary) hypertension: Secondary | ICD-10-CM | POA: Diagnosis not present

## 2023-02-08 DIAGNOSIS — E46 Unspecified protein-calorie malnutrition: Secondary | ICD-10-CM | POA: Diagnosis not present

## 2023-02-08 DIAGNOSIS — R1310 Dysphagia, unspecified: Secondary | ICD-10-CM | POA: Diagnosis not present

## 2023-02-08 DIAGNOSIS — Z741 Need for assistance with personal care: Secondary | ICD-10-CM | POA: Diagnosis not present

## 2023-02-08 DIAGNOSIS — R2681 Unsteadiness on feet: Secondary | ICD-10-CM | POA: Diagnosis not present

## 2023-02-08 DIAGNOSIS — E039 Hypothyroidism, unspecified: Secondary | ICD-10-CM | POA: Diagnosis not present

## 2023-02-08 DIAGNOSIS — J9601 Acute respiratory failure with hypoxia: Secondary | ICD-10-CM | POA: Diagnosis not present

## 2023-02-08 DIAGNOSIS — E785 Hyperlipidemia, unspecified: Secondary | ICD-10-CM | POA: Diagnosis not present

## 2023-02-08 DIAGNOSIS — Z9181 History of falling: Secondary | ICD-10-CM | POA: Diagnosis not present

## 2023-02-08 DIAGNOSIS — I6930 Unspecified sequelae of cerebral infarction: Secondary | ICD-10-CM | POA: Diagnosis not present

## 2023-02-08 DIAGNOSIS — M6259 Muscle wasting and atrophy, not elsewhere classified, multiple sites: Secondary | ICD-10-CM | POA: Diagnosis not present

## 2023-02-09 DIAGNOSIS — Z741 Need for assistance with personal care: Secondary | ICD-10-CM | POA: Diagnosis not present

## 2023-02-09 DIAGNOSIS — Z9181 History of falling: Secondary | ICD-10-CM | POA: Diagnosis not present

## 2023-02-09 DIAGNOSIS — R2681 Unsteadiness on feet: Secondary | ICD-10-CM | POA: Diagnosis not present

## 2023-02-09 DIAGNOSIS — M6281 Muscle weakness (generalized): Secondary | ICD-10-CM | POA: Diagnosis not present

## 2023-02-09 DIAGNOSIS — E46 Unspecified protein-calorie malnutrition: Secondary | ICD-10-CM | POA: Diagnosis not present

## 2023-02-09 DIAGNOSIS — E785 Hyperlipidemia, unspecified: Secondary | ICD-10-CM | POA: Diagnosis not present

## 2023-02-09 DIAGNOSIS — I6930 Unspecified sequelae of cerebral infarction: Secondary | ICD-10-CM | POA: Diagnosis not present

## 2023-02-09 DIAGNOSIS — R5381 Other malaise: Secondary | ICD-10-CM | POA: Diagnosis not present

## 2023-02-09 DIAGNOSIS — I10 Essential (primary) hypertension: Secondary | ICD-10-CM | POA: Diagnosis not present

## 2023-02-09 DIAGNOSIS — J9601 Acute respiratory failure with hypoxia: Secondary | ICD-10-CM | POA: Diagnosis not present

## 2023-02-09 DIAGNOSIS — M6259 Muscle wasting and atrophy, not elsewhere classified, multiple sites: Secondary | ICD-10-CM | POA: Diagnosis not present

## 2023-02-09 DIAGNOSIS — R1310 Dysphagia, unspecified: Secondary | ICD-10-CM | POA: Diagnosis not present

## 2023-02-09 DIAGNOSIS — E039 Hypothyroidism, unspecified: Secondary | ICD-10-CM | POA: Diagnosis not present

## 2023-02-10 DIAGNOSIS — E039 Hypothyroidism, unspecified: Secondary | ICD-10-CM | POA: Diagnosis not present

## 2023-02-10 DIAGNOSIS — I10 Essential (primary) hypertension: Secondary | ICD-10-CM | POA: Diagnosis not present

## 2023-02-10 DIAGNOSIS — Z9181 History of falling: Secondary | ICD-10-CM | POA: Diagnosis not present

## 2023-02-10 DIAGNOSIS — M6259 Muscle wasting and atrophy, not elsewhere classified, multiple sites: Secondary | ICD-10-CM | POA: Diagnosis not present

## 2023-02-10 DIAGNOSIS — Z741 Need for assistance with personal care: Secondary | ICD-10-CM | POA: Diagnosis not present

## 2023-02-10 DIAGNOSIS — J9601 Acute respiratory failure with hypoxia: Secondary | ICD-10-CM | POA: Diagnosis not present

## 2023-02-10 DIAGNOSIS — M6281 Muscle weakness (generalized): Secondary | ICD-10-CM | POA: Diagnosis not present

## 2023-02-10 DIAGNOSIS — R2681 Unsteadiness on feet: Secondary | ICD-10-CM | POA: Diagnosis not present

## 2023-02-10 DIAGNOSIS — R5381 Other malaise: Secondary | ICD-10-CM | POA: Diagnosis not present

## 2023-02-10 DIAGNOSIS — E46 Unspecified protein-calorie malnutrition: Secondary | ICD-10-CM | POA: Diagnosis not present

## 2023-02-10 DIAGNOSIS — E785 Hyperlipidemia, unspecified: Secondary | ICD-10-CM | POA: Diagnosis not present

## 2023-02-10 DIAGNOSIS — I6930 Unspecified sequelae of cerebral infarction: Secondary | ICD-10-CM | POA: Diagnosis not present

## 2023-02-10 DIAGNOSIS — R1310 Dysphagia, unspecified: Secondary | ICD-10-CM | POA: Diagnosis not present

## 2023-02-11 DIAGNOSIS — E785 Hyperlipidemia, unspecified: Secondary | ICD-10-CM | POA: Diagnosis not present

## 2023-02-11 DIAGNOSIS — R1310 Dysphagia, unspecified: Secondary | ICD-10-CM | POA: Diagnosis not present

## 2023-02-11 DIAGNOSIS — R5381 Other malaise: Secondary | ICD-10-CM | POA: Diagnosis not present

## 2023-02-11 DIAGNOSIS — M6259 Muscle wasting and atrophy, not elsewhere classified, multiple sites: Secondary | ICD-10-CM | POA: Diagnosis not present

## 2023-02-11 DIAGNOSIS — M6281 Muscle weakness (generalized): Secondary | ICD-10-CM | POA: Diagnosis not present

## 2023-02-11 DIAGNOSIS — R2681 Unsteadiness on feet: Secondary | ICD-10-CM | POA: Diagnosis not present

## 2023-02-11 DIAGNOSIS — J9601 Acute respiratory failure with hypoxia: Secondary | ICD-10-CM | POA: Diagnosis not present

## 2023-02-11 DIAGNOSIS — E039 Hypothyroidism, unspecified: Secondary | ICD-10-CM | POA: Diagnosis not present

## 2023-02-11 DIAGNOSIS — I6930 Unspecified sequelae of cerebral infarction: Secondary | ICD-10-CM | POA: Diagnosis not present

## 2023-02-11 DIAGNOSIS — Z9181 History of falling: Secondary | ICD-10-CM | POA: Diagnosis not present

## 2023-02-11 DIAGNOSIS — I10 Essential (primary) hypertension: Secondary | ICD-10-CM | POA: Diagnosis not present

## 2023-02-11 DIAGNOSIS — Z741 Need for assistance with personal care: Secondary | ICD-10-CM | POA: Diagnosis not present

## 2023-02-11 DIAGNOSIS — E46 Unspecified protein-calorie malnutrition: Secondary | ICD-10-CM | POA: Diagnosis not present

## 2023-02-12 DIAGNOSIS — R2681 Unsteadiness on feet: Secondary | ICD-10-CM | POA: Diagnosis not present

## 2023-02-12 DIAGNOSIS — R5381 Other malaise: Secondary | ICD-10-CM | POA: Diagnosis not present

## 2023-02-12 DIAGNOSIS — E785 Hyperlipidemia, unspecified: Secondary | ICD-10-CM | POA: Diagnosis not present

## 2023-02-12 DIAGNOSIS — Z9181 History of falling: Secondary | ICD-10-CM | POA: Diagnosis not present

## 2023-02-12 DIAGNOSIS — I10 Essential (primary) hypertension: Secondary | ICD-10-CM | POA: Diagnosis not present

## 2023-02-12 DIAGNOSIS — R1310 Dysphagia, unspecified: Secondary | ICD-10-CM | POA: Diagnosis not present

## 2023-02-12 DIAGNOSIS — E039 Hypothyroidism, unspecified: Secondary | ICD-10-CM | POA: Diagnosis not present

## 2023-02-12 DIAGNOSIS — E46 Unspecified protein-calorie malnutrition: Secondary | ICD-10-CM | POA: Diagnosis not present

## 2023-02-12 DIAGNOSIS — Z741 Need for assistance with personal care: Secondary | ICD-10-CM | POA: Diagnosis not present

## 2023-02-12 DIAGNOSIS — M6281 Muscle weakness (generalized): Secondary | ICD-10-CM | POA: Diagnosis not present

## 2023-02-12 DIAGNOSIS — J9601 Acute respiratory failure with hypoxia: Secondary | ICD-10-CM | POA: Diagnosis not present

## 2023-02-12 DIAGNOSIS — M6259 Muscle wasting and atrophy, not elsewhere classified, multiple sites: Secondary | ICD-10-CM | POA: Diagnosis not present

## 2023-02-12 DIAGNOSIS — I6930 Unspecified sequelae of cerebral infarction: Secondary | ICD-10-CM | POA: Diagnosis not present

## 2023-02-13 DIAGNOSIS — E785 Hyperlipidemia, unspecified: Secondary | ICD-10-CM | POA: Diagnosis not present

## 2023-02-13 DIAGNOSIS — I6930 Unspecified sequelae of cerebral infarction: Secondary | ICD-10-CM | POA: Diagnosis not present

## 2023-02-13 DIAGNOSIS — R1310 Dysphagia, unspecified: Secondary | ICD-10-CM | POA: Diagnosis not present

## 2023-02-13 DIAGNOSIS — E46 Unspecified protein-calorie malnutrition: Secondary | ICD-10-CM | POA: Diagnosis not present

## 2023-02-13 DIAGNOSIS — J9601 Acute respiratory failure with hypoxia: Secondary | ICD-10-CM | POA: Diagnosis not present

## 2023-02-13 DIAGNOSIS — R5381 Other malaise: Secondary | ICD-10-CM | POA: Diagnosis not present

## 2023-02-13 DIAGNOSIS — R2681 Unsteadiness on feet: Secondary | ICD-10-CM | POA: Diagnosis not present

## 2023-02-13 DIAGNOSIS — Z9181 History of falling: Secondary | ICD-10-CM | POA: Diagnosis not present

## 2023-02-13 DIAGNOSIS — I10 Essential (primary) hypertension: Secondary | ICD-10-CM | POA: Diagnosis not present

## 2023-02-13 DIAGNOSIS — Z741 Need for assistance with personal care: Secondary | ICD-10-CM | POA: Diagnosis not present

## 2023-02-13 DIAGNOSIS — M6259 Muscle wasting and atrophy, not elsewhere classified, multiple sites: Secondary | ICD-10-CM | POA: Diagnosis not present

## 2023-02-13 DIAGNOSIS — M6281 Muscle weakness (generalized): Secondary | ICD-10-CM | POA: Diagnosis not present

## 2023-02-13 DIAGNOSIS — E039 Hypothyroidism, unspecified: Secondary | ICD-10-CM | POA: Diagnosis not present

## 2023-02-15 DIAGNOSIS — I6930 Unspecified sequelae of cerebral infarction: Secondary | ICD-10-CM | POA: Diagnosis not present

## 2023-02-15 DIAGNOSIS — Z9181 History of falling: Secondary | ICD-10-CM | POA: Diagnosis not present

## 2023-02-15 DIAGNOSIS — R2681 Unsteadiness on feet: Secondary | ICD-10-CM | POA: Diagnosis not present

## 2023-02-15 DIAGNOSIS — Z741 Need for assistance with personal care: Secondary | ICD-10-CM | POA: Diagnosis not present

## 2023-02-15 DIAGNOSIS — M6259 Muscle wasting and atrophy, not elsewhere classified, multiple sites: Secondary | ICD-10-CM | POA: Diagnosis not present

## 2023-02-15 DIAGNOSIS — E46 Unspecified protein-calorie malnutrition: Secondary | ICD-10-CM | POA: Diagnosis not present

## 2023-02-15 DIAGNOSIS — R5381 Other malaise: Secondary | ICD-10-CM | POA: Diagnosis not present

## 2023-02-15 DIAGNOSIS — M6281 Muscle weakness (generalized): Secondary | ICD-10-CM | POA: Diagnosis not present

## 2023-02-15 DIAGNOSIS — E785 Hyperlipidemia, unspecified: Secondary | ICD-10-CM | POA: Diagnosis not present

## 2023-02-15 DIAGNOSIS — I10 Essential (primary) hypertension: Secondary | ICD-10-CM | POA: Diagnosis not present

## 2023-02-15 DIAGNOSIS — E039 Hypothyroidism, unspecified: Secondary | ICD-10-CM | POA: Diagnosis not present

## 2023-02-15 DIAGNOSIS — J9601 Acute respiratory failure with hypoxia: Secondary | ICD-10-CM | POA: Diagnosis not present

## 2023-02-15 DIAGNOSIS — R1310 Dysphagia, unspecified: Secondary | ICD-10-CM | POA: Diagnosis not present

## 2023-02-16 DIAGNOSIS — I10 Essential (primary) hypertension: Secondary | ICD-10-CM | POA: Diagnosis not present

## 2023-02-16 DIAGNOSIS — Z79899 Other long term (current) drug therapy: Secondary | ICD-10-CM | POA: Diagnosis not present

## 2023-02-16 DIAGNOSIS — R1310 Dysphagia, unspecified: Secondary | ICD-10-CM | POA: Diagnosis not present

## 2023-02-16 DIAGNOSIS — R2681 Unsteadiness on feet: Secondary | ICD-10-CM | POA: Diagnosis not present

## 2023-02-16 DIAGNOSIS — R5381 Other malaise: Secondary | ICD-10-CM | POA: Diagnosis not present

## 2023-02-16 DIAGNOSIS — J9601 Acute respiratory failure with hypoxia: Secondary | ICD-10-CM | POA: Diagnosis not present

## 2023-02-16 DIAGNOSIS — Z741 Need for assistance with personal care: Secondary | ICD-10-CM | POA: Diagnosis not present

## 2023-02-16 DIAGNOSIS — E785 Hyperlipidemia, unspecified: Secondary | ICD-10-CM | POA: Diagnosis not present

## 2023-02-16 DIAGNOSIS — E039 Hypothyroidism, unspecified: Secondary | ICD-10-CM | POA: Diagnosis not present

## 2023-02-16 DIAGNOSIS — E46 Unspecified protein-calorie malnutrition: Secondary | ICD-10-CM | POA: Diagnosis not present

## 2023-02-16 DIAGNOSIS — I6930 Unspecified sequelae of cerebral infarction: Secondary | ICD-10-CM | POA: Diagnosis not present

## 2023-02-16 DIAGNOSIS — M6281 Muscle weakness (generalized): Secondary | ICD-10-CM | POA: Diagnosis not present

## 2023-02-16 DIAGNOSIS — Z9181 History of falling: Secondary | ICD-10-CM | POA: Diagnosis not present

## 2023-02-16 DIAGNOSIS — M6259 Muscle wasting and atrophy, not elsewhere classified, multiple sites: Secondary | ICD-10-CM | POA: Diagnosis not present

## 2023-02-18 DIAGNOSIS — M6259 Muscle wasting and atrophy, not elsewhere classified, multiple sites: Secondary | ICD-10-CM | POA: Diagnosis not present

## 2023-02-18 DIAGNOSIS — R2681 Unsteadiness on feet: Secondary | ICD-10-CM | POA: Diagnosis not present

## 2023-02-18 DIAGNOSIS — E46 Unspecified protein-calorie malnutrition: Secondary | ICD-10-CM | POA: Diagnosis not present

## 2023-02-18 DIAGNOSIS — Z741 Need for assistance with personal care: Secondary | ICD-10-CM | POA: Diagnosis not present

## 2023-02-18 DIAGNOSIS — R5381 Other malaise: Secondary | ICD-10-CM | POA: Diagnosis not present

## 2023-02-18 DIAGNOSIS — M6281 Muscle weakness (generalized): Secondary | ICD-10-CM | POA: Diagnosis not present

## 2023-02-18 DIAGNOSIS — I10 Essential (primary) hypertension: Secondary | ICD-10-CM | POA: Diagnosis not present

## 2023-02-18 DIAGNOSIS — E039 Hypothyroidism, unspecified: Secondary | ICD-10-CM | POA: Diagnosis not present

## 2023-02-18 DIAGNOSIS — E785 Hyperlipidemia, unspecified: Secondary | ICD-10-CM | POA: Diagnosis not present

## 2023-02-18 DIAGNOSIS — I6930 Unspecified sequelae of cerebral infarction: Secondary | ICD-10-CM | POA: Diagnosis not present

## 2023-02-18 DIAGNOSIS — R1310 Dysphagia, unspecified: Secondary | ICD-10-CM | POA: Diagnosis not present

## 2023-02-18 DIAGNOSIS — Z9181 History of falling: Secondary | ICD-10-CM | POA: Diagnosis not present

## 2023-02-18 DIAGNOSIS — J9601 Acute respiratory failure with hypoxia: Secondary | ICD-10-CM | POA: Diagnosis not present

## 2023-03-01 DIAGNOSIS — F01511 Vascular dementia, unspecified severity, with agitation: Secondary | ICD-10-CM | POA: Diagnosis not present

## 2023-03-01 DIAGNOSIS — J9620 Acute and chronic respiratory failure, unspecified whether with hypoxia or hypercapnia: Secondary | ICD-10-CM | POA: Diagnosis not present

## 2023-03-01 DIAGNOSIS — E785 Hyperlipidemia, unspecified: Secondary | ICD-10-CM | POA: Diagnosis not present

## 2023-03-01 DIAGNOSIS — I10 Essential (primary) hypertension: Secondary | ICD-10-CM | POA: Diagnosis not present

## 2023-03-02 DIAGNOSIS — F01511 Vascular dementia, unspecified severity, with agitation: Secondary | ICD-10-CM | POA: Diagnosis not present

## 2023-03-02 DIAGNOSIS — I69311 Memory deficit following cerebral infarction: Secondary | ICD-10-CM | POA: Diagnosis not present

## 2023-03-29 DIAGNOSIS — N390 Urinary tract infection, site not specified: Secondary | ICD-10-CM | POA: Diagnosis not present

## 2023-03-29 DIAGNOSIS — I10 Essential (primary) hypertension: Secondary | ICD-10-CM | POA: Diagnosis not present

## 2023-03-29 DIAGNOSIS — E785 Hyperlipidemia, unspecified: Secondary | ICD-10-CM | POA: Diagnosis not present

## 2023-03-30 DIAGNOSIS — R451 Restlessness and agitation: Secondary | ICD-10-CM | POA: Diagnosis not present

## 2023-03-30 DIAGNOSIS — F01B18 Vascular dementia, moderate, with other behavioral disturbance: Secondary | ICD-10-CM | POA: Diagnosis not present

## 2023-03-30 DIAGNOSIS — I69311 Memory deficit following cerebral infarction: Secondary | ICD-10-CM | POA: Diagnosis not present

## 2023-04-13 DIAGNOSIS — I69311 Memory deficit following cerebral infarction: Secondary | ICD-10-CM | POA: Diagnosis not present

## 2023-04-13 DIAGNOSIS — N390 Urinary tract infection, site not specified: Secondary | ICD-10-CM | POA: Diagnosis not present

## 2023-04-13 DIAGNOSIS — R451 Restlessness and agitation: Secondary | ICD-10-CM | POA: Diagnosis not present

## 2023-04-13 DIAGNOSIS — B9689 Other specified bacterial agents as the cause of diseases classified elsewhere: Secondary | ICD-10-CM | POA: Diagnosis not present

## 2023-04-13 DIAGNOSIS — F01B18 Vascular dementia, moderate, with other behavioral disturbance: Secondary | ICD-10-CM | POA: Diagnosis not present

## 2023-04-27 DIAGNOSIS — I69311 Memory deficit following cerebral infarction: Secondary | ICD-10-CM | POA: Diagnosis not present

## 2023-04-27 DIAGNOSIS — F01B18 Vascular dementia, moderate, with other behavioral disturbance: Secondary | ICD-10-CM | POA: Diagnosis not present

## 2023-04-27 DIAGNOSIS — R451 Restlessness and agitation: Secondary | ICD-10-CM | POA: Diagnosis not present

## 2023-05-25 DIAGNOSIS — F01B18 Vascular dementia, moderate, with other behavioral disturbance: Secondary | ICD-10-CM | POA: Diagnosis not present

## 2023-05-25 DIAGNOSIS — I69311 Memory deficit following cerebral infarction: Secondary | ICD-10-CM | POA: Diagnosis not present

## 2023-06-01 DIAGNOSIS — M6281 Muscle weakness (generalized): Secondary | ICD-10-CM | POA: Diagnosis not present

## 2023-06-01 DIAGNOSIS — I6389 Other cerebral infarction: Secondary | ICD-10-CM | POA: Diagnosis not present

## 2023-06-01 DIAGNOSIS — E785 Hyperlipidemia, unspecified: Secondary | ICD-10-CM | POA: Diagnosis not present

## 2023-06-01 DIAGNOSIS — R2681 Unsteadiness on feet: Secondary | ICD-10-CM | POA: Diagnosis not present

## 2023-06-01 DIAGNOSIS — I10 Essential (primary) hypertension: Secondary | ICD-10-CM | POA: Diagnosis not present

## 2023-06-02 DIAGNOSIS — M6281 Muscle weakness (generalized): Secondary | ICD-10-CM | POA: Diagnosis not present

## 2023-06-02 DIAGNOSIS — R2681 Unsteadiness on feet: Secondary | ICD-10-CM | POA: Diagnosis not present

## 2023-06-02 DIAGNOSIS — I6389 Other cerebral infarction: Secondary | ICD-10-CM | POA: Diagnosis not present

## 2023-06-03 DIAGNOSIS — M6281 Muscle weakness (generalized): Secondary | ICD-10-CM | POA: Diagnosis not present

## 2023-06-03 DIAGNOSIS — I6389 Other cerebral infarction: Secondary | ICD-10-CM | POA: Diagnosis not present

## 2023-06-03 DIAGNOSIS — R2681 Unsteadiness on feet: Secondary | ICD-10-CM | POA: Diagnosis not present

## 2023-06-07 DIAGNOSIS — M6281 Muscle weakness (generalized): Secondary | ICD-10-CM | POA: Diagnosis not present

## 2023-06-07 DIAGNOSIS — I6389 Other cerebral infarction: Secondary | ICD-10-CM | POA: Diagnosis not present

## 2023-06-07 DIAGNOSIS — R2681 Unsteadiness on feet: Secondary | ICD-10-CM | POA: Diagnosis not present

## 2023-06-08 DIAGNOSIS — I6389 Other cerebral infarction: Secondary | ICD-10-CM | POA: Diagnosis not present

## 2023-06-08 DIAGNOSIS — R2681 Unsteadiness on feet: Secondary | ICD-10-CM | POA: Diagnosis not present

## 2023-06-08 DIAGNOSIS — M6281 Muscle weakness (generalized): Secondary | ICD-10-CM | POA: Diagnosis not present

## 2023-06-09 DIAGNOSIS — R2681 Unsteadiness on feet: Secondary | ICD-10-CM | POA: Diagnosis not present

## 2023-06-09 DIAGNOSIS — M6281 Muscle weakness (generalized): Secondary | ICD-10-CM | POA: Diagnosis not present

## 2023-06-09 DIAGNOSIS — I6389 Other cerebral infarction: Secondary | ICD-10-CM | POA: Diagnosis not present

## 2023-06-10 DIAGNOSIS — I6389 Other cerebral infarction: Secondary | ICD-10-CM | POA: Diagnosis not present

## 2023-06-10 DIAGNOSIS — M6281 Muscle weakness (generalized): Secondary | ICD-10-CM | POA: Diagnosis not present

## 2023-06-10 DIAGNOSIS — R2681 Unsteadiness on feet: Secondary | ICD-10-CM | POA: Diagnosis not present

## 2023-06-11 DIAGNOSIS — I6389 Other cerebral infarction: Secondary | ICD-10-CM | POA: Diagnosis not present

## 2023-06-11 DIAGNOSIS — R2681 Unsteadiness on feet: Secondary | ICD-10-CM | POA: Diagnosis not present

## 2023-06-11 DIAGNOSIS — M6281 Muscle weakness (generalized): Secondary | ICD-10-CM | POA: Diagnosis not present

## 2023-06-14 DIAGNOSIS — R2681 Unsteadiness on feet: Secondary | ICD-10-CM | POA: Diagnosis not present

## 2023-06-14 DIAGNOSIS — M6281 Muscle weakness (generalized): Secondary | ICD-10-CM | POA: Diagnosis not present

## 2023-06-14 DIAGNOSIS — I6389 Other cerebral infarction: Secondary | ICD-10-CM | POA: Diagnosis not present

## 2023-06-15 DIAGNOSIS — I6389 Other cerebral infarction: Secondary | ICD-10-CM | POA: Diagnosis not present

## 2023-06-15 DIAGNOSIS — M6281 Muscle weakness (generalized): Secondary | ICD-10-CM | POA: Diagnosis not present

## 2023-06-15 DIAGNOSIS — R2681 Unsteadiness on feet: Secondary | ICD-10-CM | POA: Diagnosis not present

## 2023-06-16 DIAGNOSIS — R2681 Unsteadiness on feet: Secondary | ICD-10-CM | POA: Diagnosis not present

## 2023-06-16 DIAGNOSIS — I6389 Other cerebral infarction: Secondary | ICD-10-CM | POA: Diagnosis not present

## 2023-06-16 DIAGNOSIS — M6281 Muscle weakness (generalized): Secondary | ICD-10-CM | POA: Diagnosis not present

## 2023-06-17 DIAGNOSIS — I6389 Other cerebral infarction: Secondary | ICD-10-CM | POA: Diagnosis not present

## 2023-06-17 DIAGNOSIS — R2681 Unsteadiness on feet: Secondary | ICD-10-CM | POA: Diagnosis not present

## 2023-06-17 DIAGNOSIS — M6281 Muscle weakness (generalized): Secondary | ICD-10-CM | POA: Diagnosis not present

## 2023-06-18 DIAGNOSIS — R2681 Unsteadiness on feet: Secondary | ICD-10-CM | POA: Diagnosis not present

## 2023-06-18 DIAGNOSIS — I6389 Other cerebral infarction: Secondary | ICD-10-CM | POA: Diagnosis not present

## 2023-06-18 DIAGNOSIS — M6281 Muscle weakness (generalized): Secondary | ICD-10-CM | POA: Diagnosis not present

## 2023-06-21 DIAGNOSIS — R2681 Unsteadiness on feet: Secondary | ICD-10-CM | POA: Diagnosis not present

## 2023-06-21 DIAGNOSIS — I6389 Other cerebral infarction: Secondary | ICD-10-CM | POA: Diagnosis not present

## 2023-06-21 DIAGNOSIS — M6281 Muscle weakness (generalized): Secondary | ICD-10-CM | POA: Diagnosis not present

## 2023-06-22 DIAGNOSIS — M6281 Muscle weakness (generalized): Secondary | ICD-10-CM | POA: Diagnosis not present

## 2023-06-22 DIAGNOSIS — I69311 Memory deficit following cerebral infarction: Secondary | ICD-10-CM | POA: Diagnosis not present

## 2023-06-22 DIAGNOSIS — I6389 Other cerebral infarction: Secondary | ICD-10-CM | POA: Diagnosis not present

## 2023-06-22 DIAGNOSIS — R2681 Unsteadiness on feet: Secondary | ICD-10-CM | POA: Diagnosis not present

## 2023-06-22 DIAGNOSIS — F01B18 Vascular dementia, moderate, with other behavioral disturbance: Secondary | ICD-10-CM | POA: Diagnosis not present

## 2023-06-24 DIAGNOSIS — M6281 Muscle weakness (generalized): Secondary | ICD-10-CM | POA: Diagnosis not present

## 2023-06-24 DIAGNOSIS — R2681 Unsteadiness on feet: Secondary | ICD-10-CM | POA: Diagnosis not present

## 2023-06-24 DIAGNOSIS — I6389 Other cerebral infarction: Secondary | ICD-10-CM | POA: Diagnosis not present

## 2023-06-25 DIAGNOSIS — M6281 Muscle weakness (generalized): Secondary | ICD-10-CM | POA: Diagnosis not present

## 2023-06-25 DIAGNOSIS — R2681 Unsteadiness on feet: Secondary | ICD-10-CM | POA: Diagnosis not present

## 2023-06-25 DIAGNOSIS — I6389 Other cerebral infarction: Secondary | ICD-10-CM | POA: Diagnosis not present

## 2023-06-28 DIAGNOSIS — I10 Essential (primary) hypertension: Secondary | ICD-10-CM | POA: Diagnosis not present

## 2023-06-28 DIAGNOSIS — R2681 Unsteadiness on feet: Secondary | ICD-10-CM | POA: Diagnosis not present

## 2023-06-28 DIAGNOSIS — R1310 Dysphagia, unspecified: Secondary | ICD-10-CM | POA: Diagnosis not present

## 2023-06-28 DIAGNOSIS — E039 Hypothyroidism, unspecified: Secondary | ICD-10-CM | POA: Diagnosis not present

## 2023-06-28 DIAGNOSIS — I6389 Other cerebral infarction: Secondary | ICD-10-CM | POA: Diagnosis not present

## 2023-06-28 DIAGNOSIS — M6281 Muscle weakness (generalized): Secondary | ICD-10-CM | POA: Diagnosis not present

## 2023-06-30 DIAGNOSIS — I10 Essential (primary) hypertension: Secondary | ICD-10-CM | POA: Diagnosis not present

## 2023-06-30 DIAGNOSIS — R2681 Unsteadiness on feet: Secondary | ICD-10-CM | POA: Diagnosis not present

## 2023-06-30 DIAGNOSIS — I6389 Other cerebral infarction: Secondary | ICD-10-CM | POA: Diagnosis not present

## 2023-06-30 DIAGNOSIS — E039 Hypothyroidism, unspecified: Secondary | ICD-10-CM | POA: Diagnosis not present

## 2023-06-30 DIAGNOSIS — M6281 Muscle weakness (generalized): Secondary | ICD-10-CM | POA: Diagnosis not present

## 2023-06-30 DIAGNOSIS — R1310 Dysphagia, unspecified: Secondary | ICD-10-CM | POA: Diagnosis not present

## 2023-07-15 DIAGNOSIS — I10 Essential (primary) hypertension: Secondary | ICD-10-CM | POA: Diagnosis not present

## 2023-07-15 DIAGNOSIS — E119 Type 2 diabetes mellitus without complications: Secondary | ICD-10-CM | POA: Diagnosis not present

## 2023-07-19 DIAGNOSIS — I10 Essential (primary) hypertension: Secondary | ICD-10-CM | POA: Diagnosis not present

## 2023-07-19 DIAGNOSIS — E785 Hyperlipidemia, unspecified: Secondary | ICD-10-CM | POA: Diagnosis not present

## 2023-08-02 DIAGNOSIS — I10 Essential (primary) hypertension: Secondary | ICD-10-CM | POA: Diagnosis not present

## 2023-08-02 DIAGNOSIS — E785 Hyperlipidemia, unspecified: Secondary | ICD-10-CM | POA: Diagnosis not present

## 2023-08-05 DIAGNOSIS — I10 Essential (primary) hypertension: Secondary | ICD-10-CM | POA: Diagnosis not present

## 2023-08-05 DIAGNOSIS — E785 Hyperlipidemia, unspecified: Secondary | ICD-10-CM | POA: Diagnosis not present

## 2023-08-16 DIAGNOSIS — J9620 Acute and chronic respiratory failure, unspecified whether with hypoxia or hypercapnia: Secondary | ICD-10-CM | POA: Diagnosis not present

## 2023-08-17 DIAGNOSIS — I6990 Unspecified sequelae of unspecified cerebrovascular disease: Secondary | ICD-10-CM | POA: Diagnosis not present

## 2023-08-31 DIAGNOSIS — Z741 Need for assistance with personal care: Secondary | ICD-10-CM | POA: Diagnosis not present

## 2023-08-31 DIAGNOSIS — M6281 Muscle weakness (generalized): Secondary | ICD-10-CM | POA: Diagnosis not present

## 2023-09-01 DIAGNOSIS — Z741 Need for assistance with personal care: Secondary | ICD-10-CM | POA: Diagnosis not present

## 2023-09-01 DIAGNOSIS — M6281 Muscle weakness (generalized): Secondary | ICD-10-CM | POA: Diagnosis not present

## 2023-09-02 DIAGNOSIS — Z741 Need for assistance with personal care: Secondary | ICD-10-CM | POA: Diagnosis not present

## 2023-09-02 DIAGNOSIS — M6281 Muscle weakness (generalized): Secondary | ICD-10-CM | POA: Diagnosis not present

## 2023-09-04 DIAGNOSIS — M6281 Muscle weakness (generalized): Secondary | ICD-10-CM | POA: Diagnosis not present

## 2023-09-04 DIAGNOSIS — Z741 Need for assistance with personal care: Secondary | ICD-10-CM | POA: Diagnosis not present

## 2023-09-06 DIAGNOSIS — M6281 Muscle weakness (generalized): Secondary | ICD-10-CM | POA: Diagnosis not present

## 2023-09-06 DIAGNOSIS — Z741 Need for assistance with personal care: Secondary | ICD-10-CM | POA: Diagnosis not present

## 2023-09-07 DIAGNOSIS — M6281 Muscle weakness (generalized): Secondary | ICD-10-CM | POA: Diagnosis not present

## 2023-09-07 DIAGNOSIS — Z741 Need for assistance with personal care: Secondary | ICD-10-CM | POA: Diagnosis not present

## 2023-09-13 DIAGNOSIS — M6281 Muscle weakness (generalized): Secondary | ICD-10-CM | POA: Diagnosis not present

## 2023-09-13 DIAGNOSIS — Z741 Need for assistance with personal care: Secondary | ICD-10-CM | POA: Diagnosis not present

## 2023-09-14 DIAGNOSIS — I69811 Memory deficit following other cerebrovascular disease: Secondary | ICD-10-CM | POA: Diagnosis not present

## 2023-09-27 DIAGNOSIS — E785 Hyperlipidemia, unspecified: Secondary | ICD-10-CM | POA: Diagnosis not present

## 2023-09-27 DIAGNOSIS — I10 Essential (primary) hypertension: Secondary | ICD-10-CM | POA: Diagnosis not present

## 2023-09-30 DIAGNOSIS — I10 Essential (primary) hypertension: Secondary | ICD-10-CM | POA: Diagnosis not present

## 2023-09-30 DIAGNOSIS — E785 Hyperlipidemia, unspecified: Secondary | ICD-10-CM | POA: Diagnosis not present

## 2023-09-30 DIAGNOSIS — Z79899 Other long term (current) drug therapy: Secondary | ICD-10-CM | POA: Diagnosis not present

## 2023-10-05 DIAGNOSIS — I69811 Memory deficit following other cerebrovascular disease: Secondary | ICD-10-CM | POA: Diagnosis not present

## 2023-10-05 DIAGNOSIS — I679 Cerebrovascular disease, unspecified: Secondary | ICD-10-CM | POA: Diagnosis not present

## 2023-10-05 DIAGNOSIS — I10 Essential (primary) hypertension: Secondary | ICD-10-CM | POA: Diagnosis not present

## 2023-10-05 DIAGNOSIS — E785 Hyperlipidemia, unspecified: Secondary | ICD-10-CM | POA: Diagnosis not present

## 2023-10-05 DIAGNOSIS — E119 Type 2 diabetes mellitus without complications: Secondary | ICD-10-CM | POA: Diagnosis not present

## 2023-11-02 DIAGNOSIS — I69811 Memory deficit following other cerebrovascular disease: Secondary | ICD-10-CM | POA: Diagnosis not present

## 2023-11-19 DIAGNOSIS — I679 Cerebrovascular disease, unspecified: Secondary | ICD-10-CM | POA: Diagnosis not present

## 2023-11-19 DIAGNOSIS — I10 Essential (primary) hypertension: Secondary | ICD-10-CM | POA: Diagnosis not present

## 2023-11-19 DIAGNOSIS — E785 Hyperlipidemia, unspecified: Secondary | ICD-10-CM | POA: Diagnosis not present

## 2023-11-22 DIAGNOSIS — R296 Repeated falls: Secondary | ICD-10-CM | POA: Diagnosis not present

## 2023-11-22 DIAGNOSIS — I679 Cerebrovascular disease, unspecified: Secondary | ICD-10-CM | POA: Diagnosis not present

## 2023-11-22 DIAGNOSIS — I10 Essential (primary) hypertension: Secondary | ICD-10-CM | POA: Diagnosis not present

## 2023-11-22 DIAGNOSIS — E785 Hyperlipidemia, unspecified: Secondary | ICD-10-CM | POA: Diagnosis not present

## 2023-11-24 DIAGNOSIS — R1311 Dysphagia, oral phase: Secondary | ICD-10-CM | POA: Diagnosis not present

## 2023-11-25 DIAGNOSIS — R1311 Dysphagia, oral phase: Secondary | ICD-10-CM | POA: Diagnosis not present

## 2023-11-29 DIAGNOSIS — J449 Chronic obstructive pulmonary disease, unspecified: Secondary | ICD-10-CM | POA: Diagnosis not present

## 2023-11-29 DIAGNOSIS — M6281 Muscle weakness (generalized): Secondary | ICD-10-CM | POA: Diagnosis not present

## 2023-11-29 DIAGNOSIS — R1311 Dysphagia, oral phase: Secondary | ICD-10-CM | POA: Diagnosis not present

## 2023-11-30 DIAGNOSIS — I69811 Memory deficit following other cerebrovascular disease: Secondary | ICD-10-CM | POA: Diagnosis not present

## 2023-12-03 DIAGNOSIS — J449 Chronic obstructive pulmonary disease, unspecified: Secondary | ICD-10-CM | POA: Diagnosis not present

## 2023-12-03 DIAGNOSIS — M6281 Muscle weakness (generalized): Secondary | ICD-10-CM | POA: Diagnosis not present

## 2023-12-03 DIAGNOSIS — R1311 Dysphagia, oral phase: Secondary | ICD-10-CM | POA: Diagnosis not present

## 2023-12-06 DIAGNOSIS — I10 Essential (primary) hypertension: Secondary | ICD-10-CM | POA: Diagnosis not present

## 2023-12-06 DIAGNOSIS — J449 Chronic obstructive pulmonary disease, unspecified: Secondary | ICD-10-CM | POA: Diagnosis not present

## 2023-12-06 DIAGNOSIS — R1311 Dysphagia, oral phase: Secondary | ICD-10-CM | POA: Diagnosis not present

## 2023-12-06 DIAGNOSIS — E785 Hyperlipidemia, unspecified: Secondary | ICD-10-CM | POA: Diagnosis not present

## 2023-12-06 DIAGNOSIS — I679 Cerebrovascular disease, unspecified: Secondary | ICD-10-CM | POA: Diagnosis not present

## 2023-12-06 DIAGNOSIS — M6281 Muscle weakness (generalized): Secondary | ICD-10-CM | POA: Diagnosis not present

## 2023-12-07 DIAGNOSIS — J449 Chronic obstructive pulmonary disease, unspecified: Secondary | ICD-10-CM | POA: Diagnosis not present

## 2023-12-07 DIAGNOSIS — M6281 Muscle weakness (generalized): Secondary | ICD-10-CM | POA: Diagnosis not present

## 2023-12-07 DIAGNOSIS — R1311 Dysphagia, oral phase: Secondary | ICD-10-CM | POA: Diagnosis not present

## 2023-12-08 DIAGNOSIS — R1311 Dysphagia, oral phase: Secondary | ICD-10-CM | POA: Diagnosis not present

## 2023-12-08 DIAGNOSIS — J449 Chronic obstructive pulmonary disease, unspecified: Secondary | ICD-10-CM | POA: Diagnosis not present

## 2023-12-08 DIAGNOSIS — M6281 Muscle weakness (generalized): Secondary | ICD-10-CM | POA: Diagnosis not present

## 2023-12-15 DIAGNOSIS — M6281 Muscle weakness (generalized): Secondary | ICD-10-CM | POA: Diagnosis not present

## 2023-12-15 DIAGNOSIS — R1311 Dysphagia, oral phase: Secondary | ICD-10-CM | POA: Diagnosis not present

## 2023-12-15 DIAGNOSIS — J449 Chronic obstructive pulmonary disease, unspecified: Secondary | ICD-10-CM | POA: Diagnosis not present

## 2023-12-16 DIAGNOSIS — R1311 Dysphagia, oral phase: Secondary | ICD-10-CM | POA: Diagnosis not present

## 2023-12-16 DIAGNOSIS — J449 Chronic obstructive pulmonary disease, unspecified: Secondary | ICD-10-CM | POA: Diagnosis not present

## 2023-12-16 DIAGNOSIS — M6281 Muscle weakness (generalized): Secondary | ICD-10-CM | POA: Diagnosis not present

## 2023-12-17 DIAGNOSIS — J449 Chronic obstructive pulmonary disease, unspecified: Secondary | ICD-10-CM | POA: Diagnosis not present

## 2023-12-17 DIAGNOSIS — R1311 Dysphagia, oral phase: Secondary | ICD-10-CM | POA: Diagnosis not present

## 2023-12-17 DIAGNOSIS — M6281 Muscle weakness (generalized): Secondary | ICD-10-CM | POA: Diagnosis not present

## 2023-12-20 DIAGNOSIS — M6281 Muscle weakness (generalized): Secondary | ICD-10-CM | POA: Diagnosis not present

## 2023-12-20 DIAGNOSIS — R1311 Dysphagia, oral phase: Secondary | ICD-10-CM | POA: Diagnosis not present

## 2023-12-20 DIAGNOSIS — J449 Chronic obstructive pulmonary disease, unspecified: Secondary | ICD-10-CM | POA: Diagnosis not present

## 2023-12-23 DIAGNOSIS — M6281 Muscle weakness (generalized): Secondary | ICD-10-CM | POA: Diagnosis not present

## 2023-12-23 DIAGNOSIS — R1311 Dysphagia, oral phase: Secondary | ICD-10-CM | POA: Diagnosis not present

## 2023-12-23 DIAGNOSIS — J449 Chronic obstructive pulmonary disease, unspecified: Secondary | ICD-10-CM | POA: Diagnosis not present

## 2023-12-27 DIAGNOSIS — J449 Chronic obstructive pulmonary disease, unspecified: Secondary | ICD-10-CM | POA: Diagnosis not present

## 2023-12-27 DIAGNOSIS — M6281 Muscle weakness (generalized): Secondary | ICD-10-CM | POA: Diagnosis not present

## 2023-12-28 DIAGNOSIS — I69811 Memory deficit following other cerebrovascular disease: Secondary | ICD-10-CM | POA: Diagnosis not present

## 2023-12-28 DIAGNOSIS — J449 Chronic obstructive pulmonary disease, unspecified: Secondary | ICD-10-CM | POA: Diagnosis not present

## 2023-12-28 DIAGNOSIS — M6281 Muscle weakness (generalized): Secondary | ICD-10-CM | POA: Diagnosis not present

## 2023-12-29 DIAGNOSIS — M6281 Muscle weakness (generalized): Secondary | ICD-10-CM | POA: Diagnosis not present

## 2023-12-29 DIAGNOSIS — J449 Chronic obstructive pulmonary disease, unspecified: Secondary | ICD-10-CM | POA: Diagnosis not present

## 2023-12-30 DIAGNOSIS — I679 Cerebrovascular disease, unspecified: Secondary | ICD-10-CM | POA: Diagnosis not present

## 2023-12-30 DIAGNOSIS — E785 Hyperlipidemia, unspecified: Secondary | ICD-10-CM | POA: Diagnosis not present

## 2023-12-30 DIAGNOSIS — I10 Essential (primary) hypertension: Secondary | ICD-10-CM | POA: Diagnosis not present

## 2024-01-12 DIAGNOSIS — E039 Hypothyroidism, unspecified: Secondary | ICD-10-CM | POA: Diagnosis not present

## 2024-01-25 DIAGNOSIS — I69811 Memory deficit following other cerebrovascular disease: Secondary | ICD-10-CM | POA: Diagnosis not present

## 2024-01-26 DIAGNOSIS — D649 Anemia, unspecified: Secondary | ICD-10-CM | POA: Diagnosis not present

## 2024-01-26 DIAGNOSIS — Z79899 Other long term (current) drug therapy: Secondary | ICD-10-CM | POA: Diagnosis not present

## 2024-01-31 DIAGNOSIS — E876 Hypokalemia: Secondary | ICD-10-CM | POA: Diagnosis not present

## 2024-02-16 DIAGNOSIS — Z45010 Encounter for checking and testing of cardiac pacemaker pulse generator [battery]: Secondary | ICD-10-CM | POA: Diagnosis not present

## 2024-02-16 DIAGNOSIS — R55 Syncope and collapse: Secondary | ICD-10-CM | POA: Diagnosis not present

## 2024-02-22 DIAGNOSIS — I69811 Memory deficit following other cerebrovascular disease: Secondary | ICD-10-CM | POA: Diagnosis not present

## 2024-03-05 DIAGNOSIS — Z45010 Encounter for checking and testing of cardiac pacemaker pulse generator [battery]: Secondary | ICD-10-CM | POA: Diagnosis not present

## 2024-03-05 DIAGNOSIS — R55 Syncope and collapse: Secondary | ICD-10-CM | POA: Diagnosis not present

## 2024-03-07 DIAGNOSIS — I69811 Memory deficit following other cerebrovascular disease: Secondary | ICD-10-CM | POA: Diagnosis not present

## 2024-03-09 DIAGNOSIS — E785 Hyperlipidemia, unspecified: Secondary | ICD-10-CM | POA: Diagnosis not present

## 2024-03-09 DIAGNOSIS — I679 Cerebrovascular disease, unspecified: Secondary | ICD-10-CM | POA: Diagnosis not present

## 2024-03-09 DIAGNOSIS — I10 Essential (primary) hypertension: Secondary | ICD-10-CM | POA: Diagnosis not present

## 2024-03-16 DIAGNOSIS — M6259 Muscle wasting and atrophy, not elsewhere classified, multiple sites: Secondary | ICD-10-CM | POA: Diagnosis not present

## 2024-03-16 DIAGNOSIS — R2681 Unsteadiness on feet: Secondary | ICD-10-CM | POA: Diagnosis not present

## 2024-03-16 DIAGNOSIS — R293 Abnormal posture: Secondary | ICD-10-CM | POA: Diagnosis not present

## 2024-03-16 DIAGNOSIS — R2689 Other abnormalities of gait and mobility: Secondary | ICD-10-CM | POA: Diagnosis not present

## 2024-03-17 DIAGNOSIS — R2681 Unsteadiness on feet: Secondary | ICD-10-CM | POA: Diagnosis not present

## 2024-03-17 DIAGNOSIS — R293 Abnormal posture: Secondary | ICD-10-CM | POA: Diagnosis not present

## 2024-03-17 DIAGNOSIS — M6259 Muscle wasting and atrophy, not elsewhere classified, multiple sites: Secondary | ICD-10-CM | POA: Diagnosis not present

## 2024-03-17 DIAGNOSIS — R2689 Other abnormalities of gait and mobility: Secondary | ICD-10-CM | POA: Diagnosis not present

## 2024-03-18 DIAGNOSIS — R2681 Unsteadiness on feet: Secondary | ICD-10-CM | POA: Diagnosis not present

## 2024-03-18 DIAGNOSIS — R2689 Other abnormalities of gait and mobility: Secondary | ICD-10-CM | POA: Diagnosis not present

## 2024-03-18 DIAGNOSIS — M6259 Muscle wasting and atrophy, not elsewhere classified, multiple sites: Secondary | ICD-10-CM | POA: Diagnosis not present

## 2024-03-18 DIAGNOSIS — R293 Abnormal posture: Secondary | ICD-10-CM | POA: Diagnosis not present

## 2024-03-20 DIAGNOSIS — R293 Abnormal posture: Secondary | ICD-10-CM | POA: Diagnosis not present

## 2024-03-20 DIAGNOSIS — R2689 Other abnormalities of gait and mobility: Secondary | ICD-10-CM | POA: Diagnosis not present

## 2024-03-20 DIAGNOSIS — M6259 Muscle wasting and atrophy, not elsewhere classified, multiple sites: Secondary | ICD-10-CM | POA: Diagnosis not present

## 2024-03-20 DIAGNOSIS — R2681 Unsteadiness on feet: Secondary | ICD-10-CM | POA: Diagnosis not present

## 2024-03-21 DIAGNOSIS — R2681 Unsteadiness on feet: Secondary | ICD-10-CM | POA: Diagnosis not present

## 2024-03-21 DIAGNOSIS — I69811 Memory deficit following other cerebrovascular disease: Secondary | ICD-10-CM | POA: Diagnosis not present

## 2024-03-21 DIAGNOSIS — R293 Abnormal posture: Secondary | ICD-10-CM | POA: Diagnosis not present

## 2024-03-21 DIAGNOSIS — M6259 Muscle wasting and atrophy, not elsewhere classified, multiple sites: Secondary | ICD-10-CM | POA: Diagnosis not present

## 2024-03-21 DIAGNOSIS — R2689 Other abnormalities of gait and mobility: Secondary | ICD-10-CM | POA: Diagnosis not present

## 2024-03-23 DIAGNOSIS — E876 Hypokalemia: Secondary | ICD-10-CM | POA: Diagnosis not present

## 2024-03-27 DIAGNOSIS — I10 Essential (primary) hypertension: Secondary | ICD-10-CM | POA: Diagnosis not present

## 2024-03-27 DIAGNOSIS — R293 Abnormal posture: Secondary | ICD-10-CM | POA: Diagnosis not present

## 2024-03-27 DIAGNOSIS — I69811 Memory deficit following other cerebrovascular disease: Secondary | ICD-10-CM | POA: Diagnosis not present

## 2024-03-27 DIAGNOSIS — R2689 Other abnormalities of gait and mobility: Secondary | ICD-10-CM | POA: Diagnosis not present

## 2024-03-27 DIAGNOSIS — J9601 Acute respiratory failure with hypoxia: Secondary | ICD-10-CM | POA: Diagnosis not present

## 2024-03-27 DIAGNOSIS — M6259 Muscle wasting and atrophy, not elsewhere classified, multiple sites: Secondary | ICD-10-CM | POA: Diagnosis not present

## 2024-03-27 DIAGNOSIS — K219 Gastro-esophageal reflux disease without esophagitis: Secondary | ICD-10-CM | POA: Diagnosis not present

## 2024-03-27 DIAGNOSIS — R2681 Unsteadiness on feet: Secondary | ICD-10-CM | POA: Diagnosis not present

## 2024-03-28 DIAGNOSIS — J9601 Acute respiratory failure with hypoxia: Secondary | ICD-10-CM | POA: Diagnosis not present

## 2024-03-28 DIAGNOSIS — I10 Essential (primary) hypertension: Secondary | ICD-10-CM | POA: Diagnosis not present

## 2024-03-28 DIAGNOSIS — R2681 Unsteadiness on feet: Secondary | ICD-10-CM | POA: Diagnosis not present

## 2024-03-28 DIAGNOSIS — I69811 Memory deficit following other cerebrovascular disease: Secondary | ICD-10-CM | POA: Diagnosis not present

## 2024-03-28 DIAGNOSIS — K219 Gastro-esophageal reflux disease without esophagitis: Secondary | ICD-10-CM | POA: Diagnosis not present

## 2024-03-28 DIAGNOSIS — R293 Abnormal posture: Secondary | ICD-10-CM | POA: Diagnosis not present

## 2024-03-28 DIAGNOSIS — R2689 Other abnormalities of gait and mobility: Secondary | ICD-10-CM | POA: Diagnosis not present

## 2024-03-28 DIAGNOSIS — M6259 Muscle wasting and atrophy, not elsewhere classified, multiple sites: Secondary | ICD-10-CM | POA: Diagnosis not present

## 2024-03-29 DIAGNOSIS — R2681 Unsteadiness on feet: Secondary | ICD-10-CM | POA: Diagnosis not present

## 2024-03-29 DIAGNOSIS — R293 Abnormal posture: Secondary | ICD-10-CM | POA: Diagnosis not present

## 2024-03-29 DIAGNOSIS — J9601 Acute respiratory failure with hypoxia: Secondary | ICD-10-CM | POA: Diagnosis not present

## 2024-03-29 DIAGNOSIS — I10 Essential (primary) hypertension: Secondary | ICD-10-CM | POA: Diagnosis not present

## 2024-03-29 DIAGNOSIS — I69811 Memory deficit following other cerebrovascular disease: Secondary | ICD-10-CM | POA: Diagnosis not present

## 2024-03-29 DIAGNOSIS — M6259 Muscle wasting and atrophy, not elsewhere classified, multiple sites: Secondary | ICD-10-CM | POA: Diagnosis not present

## 2024-03-29 DIAGNOSIS — K219 Gastro-esophageal reflux disease without esophagitis: Secondary | ICD-10-CM | POA: Diagnosis not present

## 2024-03-29 DIAGNOSIS — R2689 Other abnormalities of gait and mobility: Secondary | ICD-10-CM | POA: Diagnosis not present

## 2024-03-30 DIAGNOSIS — R2689 Other abnormalities of gait and mobility: Secondary | ICD-10-CM | POA: Diagnosis not present

## 2024-03-30 DIAGNOSIS — K219 Gastro-esophageal reflux disease without esophagitis: Secondary | ICD-10-CM | POA: Diagnosis not present

## 2024-03-30 DIAGNOSIS — I10 Essential (primary) hypertension: Secondary | ICD-10-CM | POA: Diagnosis not present

## 2024-03-30 DIAGNOSIS — R2681 Unsteadiness on feet: Secondary | ICD-10-CM | POA: Diagnosis not present

## 2024-03-30 DIAGNOSIS — R293 Abnormal posture: Secondary | ICD-10-CM | POA: Diagnosis not present

## 2024-03-30 DIAGNOSIS — M6259 Muscle wasting and atrophy, not elsewhere classified, multiple sites: Secondary | ICD-10-CM | POA: Diagnosis not present

## 2024-03-30 DIAGNOSIS — I69811 Memory deficit following other cerebrovascular disease: Secondary | ICD-10-CM | POA: Diagnosis not present

## 2024-03-30 DIAGNOSIS — J9601 Acute respiratory failure with hypoxia: Secondary | ICD-10-CM | POA: Diagnosis not present

## 2024-03-31 DIAGNOSIS — I69811 Memory deficit following other cerebrovascular disease: Secondary | ICD-10-CM | POA: Diagnosis not present

## 2024-03-31 DIAGNOSIS — J9601 Acute respiratory failure with hypoxia: Secondary | ICD-10-CM | POA: Diagnosis not present

## 2024-03-31 DIAGNOSIS — M6259 Muscle wasting and atrophy, not elsewhere classified, multiple sites: Secondary | ICD-10-CM | POA: Diagnosis not present

## 2024-03-31 DIAGNOSIS — I10 Essential (primary) hypertension: Secondary | ICD-10-CM | POA: Diagnosis not present

## 2024-03-31 DIAGNOSIS — R2689 Other abnormalities of gait and mobility: Secondary | ICD-10-CM | POA: Diagnosis not present

## 2024-03-31 DIAGNOSIS — R2681 Unsteadiness on feet: Secondary | ICD-10-CM | POA: Diagnosis not present

## 2024-03-31 DIAGNOSIS — K219 Gastro-esophageal reflux disease without esophagitis: Secondary | ICD-10-CM | POA: Diagnosis not present

## 2024-03-31 DIAGNOSIS — R293 Abnormal posture: Secondary | ICD-10-CM | POA: Diagnosis not present

## 2024-04-03 DIAGNOSIS — R2689 Other abnormalities of gait and mobility: Secondary | ICD-10-CM | POA: Diagnosis not present

## 2024-04-03 DIAGNOSIS — R293 Abnormal posture: Secondary | ICD-10-CM | POA: Diagnosis not present

## 2024-04-03 DIAGNOSIS — I10 Essential (primary) hypertension: Secondary | ICD-10-CM | POA: Diagnosis not present

## 2024-04-03 DIAGNOSIS — K219 Gastro-esophageal reflux disease without esophagitis: Secondary | ICD-10-CM | POA: Diagnosis not present

## 2024-04-03 DIAGNOSIS — M6259 Muscle wasting and atrophy, not elsewhere classified, multiple sites: Secondary | ICD-10-CM | POA: Diagnosis not present

## 2024-04-03 DIAGNOSIS — R2681 Unsteadiness on feet: Secondary | ICD-10-CM | POA: Diagnosis not present

## 2024-04-03 DIAGNOSIS — I69811 Memory deficit following other cerebrovascular disease: Secondary | ICD-10-CM | POA: Diagnosis not present

## 2024-04-03 DIAGNOSIS — J9601 Acute respiratory failure with hypoxia: Secondary | ICD-10-CM | POA: Diagnosis not present

## 2024-04-05 DIAGNOSIS — K219 Gastro-esophageal reflux disease without esophagitis: Secondary | ICD-10-CM | POA: Diagnosis not present

## 2024-04-05 DIAGNOSIS — J9601 Acute respiratory failure with hypoxia: Secondary | ICD-10-CM | POA: Diagnosis not present

## 2024-04-05 DIAGNOSIS — R2681 Unsteadiness on feet: Secondary | ICD-10-CM | POA: Diagnosis not present

## 2024-04-05 DIAGNOSIS — I10 Essential (primary) hypertension: Secondary | ICD-10-CM | POA: Diagnosis not present

## 2024-04-05 DIAGNOSIS — R293 Abnormal posture: Secondary | ICD-10-CM | POA: Diagnosis not present

## 2024-04-05 DIAGNOSIS — I69811 Memory deficit following other cerebrovascular disease: Secondary | ICD-10-CM | POA: Diagnosis not present

## 2024-04-05 DIAGNOSIS — M6259 Muscle wasting and atrophy, not elsewhere classified, multiple sites: Secondary | ICD-10-CM | POA: Diagnosis not present

## 2024-04-05 DIAGNOSIS — R2689 Other abnormalities of gait and mobility: Secondary | ICD-10-CM | POA: Diagnosis not present

## 2024-04-06 DIAGNOSIS — R2681 Unsteadiness on feet: Secondary | ICD-10-CM | POA: Diagnosis not present

## 2024-04-06 DIAGNOSIS — I69811 Memory deficit following other cerebrovascular disease: Secondary | ICD-10-CM | POA: Diagnosis not present

## 2024-04-06 DIAGNOSIS — J9601 Acute respiratory failure with hypoxia: Secondary | ICD-10-CM | POA: Diagnosis not present

## 2024-04-06 DIAGNOSIS — M6259 Muscle wasting and atrophy, not elsewhere classified, multiple sites: Secondary | ICD-10-CM | POA: Diagnosis not present

## 2024-04-06 DIAGNOSIS — I10 Essential (primary) hypertension: Secondary | ICD-10-CM | POA: Diagnosis not present

## 2024-04-06 DIAGNOSIS — R251 Tremor, unspecified: Secondary | ICD-10-CM | POA: Diagnosis not present

## 2024-04-06 DIAGNOSIS — K5901 Slow transit constipation: Secondary | ICD-10-CM | POA: Diagnosis not present

## 2024-04-06 DIAGNOSIS — R293 Abnormal posture: Secondary | ICD-10-CM | POA: Diagnosis not present

## 2024-04-06 DIAGNOSIS — R2689 Other abnormalities of gait and mobility: Secondary | ICD-10-CM | POA: Diagnosis not present

## 2024-04-06 DIAGNOSIS — K219 Gastro-esophageal reflux disease without esophagitis: Secondary | ICD-10-CM | POA: Diagnosis not present

## 2024-04-10 DIAGNOSIS — R7303 Prediabetes: Secondary | ICD-10-CM | POA: Diagnosis not present

## 2024-04-11 DIAGNOSIS — R2689 Other abnormalities of gait and mobility: Secondary | ICD-10-CM | POA: Diagnosis not present

## 2024-04-11 DIAGNOSIS — K219 Gastro-esophageal reflux disease without esophagitis: Secondary | ICD-10-CM | POA: Diagnosis not present

## 2024-04-11 DIAGNOSIS — M6259 Muscle wasting and atrophy, not elsewhere classified, multiple sites: Secondary | ICD-10-CM | POA: Diagnosis not present

## 2024-04-11 DIAGNOSIS — I10 Essential (primary) hypertension: Secondary | ICD-10-CM | POA: Diagnosis not present

## 2024-04-11 DIAGNOSIS — I69811 Memory deficit following other cerebrovascular disease: Secondary | ICD-10-CM | POA: Diagnosis not present

## 2024-04-11 DIAGNOSIS — J9601 Acute respiratory failure with hypoxia: Secondary | ICD-10-CM | POA: Diagnosis not present

## 2024-04-11 DIAGNOSIS — R2681 Unsteadiness on feet: Secondary | ICD-10-CM | POA: Diagnosis not present

## 2024-04-11 DIAGNOSIS — R293 Abnormal posture: Secondary | ICD-10-CM | POA: Diagnosis not present

## 2024-04-12 DIAGNOSIS — J9601 Acute respiratory failure with hypoxia: Secondary | ICD-10-CM | POA: Diagnosis not present

## 2024-04-12 DIAGNOSIS — I10 Essential (primary) hypertension: Secondary | ICD-10-CM | POA: Diagnosis not present

## 2024-04-12 DIAGNOSIS — R293 Abnormal posture: Secondary | ICD-10-CM | POA: Diagnosis not present

## 2024-04-12 DIAGNOSIS — R2689 Other abnormalities of gait and mobility: Secondary | ICD-10-CM | POA: Diagnosis not present

## 2024-04-12 DIAGNOSIS — M6259 Muscle wasting and atrophy, not elsewhere classified, multiple sites: Secondary | ICD-10-CM | POA: Diagnosis not present

## 2024-04-12 DIAGNOSIS — K219 Gastro-esophageal reflux disease without esophagitis: Secondary | ICD-10-CM | POA: Diagnosis not present

## 2024-04-12 DIAGNOSIS — I69811 Memory deficit following other cerebrovascular disease: Secondary | ICD-10-CM | POA: Diagnosis not present

## 2024-04-12 DIAGNOSIS — R2681 Unsteadiness on feet: Secondary | ICD-10-CM | POA: Diagnosis not present

## 2024-04-13 DIAGNOSIS — I69811 Memory deficit following other cerebrovascular disease: Secondary | ICD-10-CM | POA: Diagnosis not present

## 2024-04-13 DIAGNOSIS — I10 Essential (primary) hypertension: Secondary | ICD-10-CM | POA: Diagnosis not present

## 2024-04-13 DIAGNOSIS — E785 Hyperlipidemia, unspecified: Secondary | ICD-10-CM | POA: Diagnosis not present

## 2024-04-13 DIAGNOSIS — M6259 Muscle wasting and atrophy, not elsewhere classified, multiple sites: Secondary | ICD-10-CM | POA: Diagnosis not present

## 2024-04-13 DIAGNOSIS — J9601 Acute respiratory failure with hypoxia: Secondary | ICD-10-CM | POA: Diagnosis not present

## 2024-04-13 DIAGNOSIS — I679 Cerebrovascular disease, unspecified: Secondary | ICD-10-CM | POA: Diagnosis not present

## 2024-04-13 DIAGNOSIS — R2681 Unsteadiness on feet: Secondary | ICD-10-CM | POA: Diagnosis not present

## 2024-04-13 DIAGNOSIS — R293 Abnormal posture: Secondary | ICD-10-CM | POA: Diagnosis not present

## 2024-04-13 DIAGNOSIS — R2689 Other abnormalities of gait and mobility: Secondary | ICD-10-CM | POA: Diagnosis not present

## 2024-04-13 DIAGNOSIS — K219 Gastro-esophageal reflux disease without esophagitis: Secondary | ICD-10-CM | POA: Diagnosis not present

## 2024-04-14 DIAGNOSIS — R293 Abnormal posture: Secondary | ICD-10-CM | POA: Diagnosis not present

## 2024-04-14 DIAGNOSIS — M6259 Muscle wasting and atrophy, not elsewhere classified, multiple sites: Secondary | ICD-10-CM | POA: Diagnosis not present

## 2024-04-14 DIAGNOSIS — R2689 Other abnormalities of gait and mobility: Secondary | ICD-10-CM | POA: Diagnosis not present

## 2024-04-14 DIAGNOSIS — R2681 Unsteadiness on feet: Secondary | ICD-10-CM | POA: Diagnosis not present

## 2024-04-14 DIAGNOSIS — K219 Gastro-esophageal reflux disease without esophagitis: Secondary | ICD-10-CM | POA: Diagnosis not present

## 2024-04-14 DIAGNOSIS — I69811 Memory deficit following other cerebrovascular disease: Secondary | ICD-10-CM | POA: Diagnosis not present

## 2024-04-14 DIAGNOSIS — J9601 Acute respiratory failure with hypoxia: Secondary | ICD-10-CM | POA: Diagnosis not present

## 2024-04-14 DIAGNOSIS — I10 Essential (primary) hypertension: Secondary | ICD-10-CM | POA: Diagnosis not present

## 2024-04-17 DIAGNOSIS — K219 Gastro-esophageal reflux disease without esophagitis: Secondary | ICD-10-CM | POA: Diagnosis not present

## 2024-04-17 DIAGNOSIS — I10 Essential (primary) hypertension: Secondary | ICD-10-CM | POA: Diagnosis not present

## 2024-04-17 DIAGNOSIS — R2689 Other abnormalities of gait and mobility: Secondary | ICD-10-CM | POA: Diagnosis not present

## 2024-04-17 DIAGNOSIS — J9601 Acute respiratory failure with hypoxia: Secondary | ICD-10-CM | POA: Diagnosis not present

## 2024-04-17 DIAGNOSIS — I69811 Memory deficit following other cerebrovascular disease: Secondary | ICD-10-CM | POA: Diagnosis not present

## 2024-04-17 DIAGNOSIS — R2681 Unsteadiness on feet: Secondary | ICD-10-CM | POA: Diagnosis not present

## 2024-04-17 DIAGNOSIS — M6259 Muscle wasting and atrophy, not elsewhere classified, multiple sites: Secondary | ICD-10-CM | POA: Diagnosis not present

## 2024-04-17 DIAGNOSIS — R293 Abnormal posture: Secondary | ICD-10-CM | POA: Diagnosis not present

## 2024-04-18 DIAGNOSIS — I69811 Memory deficit following other cerebrovascular disease: Secondary | ICD-10-CM | POA: Diagnosis not present

## 2024-04-22 DIAGNOSIS — R2689 Other abnormalities of gait and mobility: Secondary | ICD-10-CM | POA: Diagnosis not present

## 2024-04-22 DIAGNOSIS — M6259 Muscle wasting and atrophy, not elsewhere classified, multiple sites: Secondary | ICD-10-CM | POA: Diagnosis not present

## 2024-04-22 DIAGNOSIS — R293 Abnormal posture: Secondary | ICD-10-CM | POA: Diagnosis not present

## 2024-04-22 DIAGNOSIS — J9601 Acute respiratory failure with hypoxia: Secondary | ICD-10-CM | POA: Diagnosis not present

## 2024-04-22 DIAGNOSIS — I10 Essential (primary) hypertension: Secondary | ICD-10-CM | POA: Diagnosis not present

## 2024-04-22 DIAGNOSIS — K219 Gastro-esophageal reflux disease without esophagitis: Secondary | ICD-10-CM | POA: Diagnosis not present

## 2024-04-22 DIAGNOSIS — I69811 Memory deficit following other cerebrovascular disease: Secondary | ICD-10-CM | POA: Diagnosis not present

## 2024-04-22 DIAGNOSIS — R2681 Unsteadiness on feet: Secondary | ICD-10-CM | POA: Diagnosis not present

## 2024-04-25 DIAGNOSIS — R293 Abnormal posture: Secondary | ICD-10-CM | POA: Diagnosis not present

## 2024-04-25 DIAGNOSIS — I69811 Memory deficit following other cerebrovascular disease: Secondary | ICD-10-CM | POA: Diagnosis not present

## 2024-04-25 DIAGNOSIS — R2689 Other abnormalities of gait and mobility: Secondary | ICD-10-CM | POA: Diagnosis not present

## 2024-04-25 DIAGNOSIS — J9601 Acute respiratory failure with hypoxia: Secondary | ICD-10-CM | POA: Diagnosis not present

## 2024-04-25 DIAGNOSIS — K219 Gastro-esophageal reflux disease without esophagitis: Secondary | ICD-10-CM | POA: Diagnosis not present

## 2024-04-25 DIAGNOSIS — R2681 Unsteadiness on feet: Secondary | ICD-10-CM | POA: Diagnosis not present

## 2024-04-25 DIAGNOSIS — I10 Essential (primary) hypertension: Secondary | ICD-10-CM | POA: Diagnosis not present

## 2024-04-25 DIAGNOSIS — M6259 Muscle wasting and atrophy, not elsewhere classified, multiple sites: Secondary | ICD-10-CM | POA: Diagnosis not present

## 2024-04-26 DIAGNOSIS — I69811 Memory deficit following other cerebrovascular disease: Secondary | ICD-10-CM | POA: Diagnosis not present

## 2024-04-26 DIAGNOSIS — K219 Gastro-esophageal reflux disease without esophagitis: Secondary | ICD-10-CM | POA: Diagnosis not present

## 2024-04-26 DIAGNOSIS — J9601 Acute respiratory failure with hypoxia: Secondary | ICD-10-CM | POA: Diagnosis not present

## 2024-04-26 DIAGNOSIS — R2689 Other abnormalities of gait and mobility: Secondary | ICD-10-CM | POA: Diagnosis not present

## 2024-04-26 DIAGNOSIS — M6259 Muscle wasting and atrophy, not elsewhere classified, multiple sites: Secondary | ICD-10-CM | POA: Diagnosis not present

## 2024-04-26 DIAGNOSIS — R293 Abnormal posture: Secondary | ICD-10-CM | POA: Diagnosis not present

## 2024-04-26 DIAGNOSIS — R2681 Unsteadiness on feet: Secondary | ICD-10-CM | POA: Diagnosis not present

## 2024-04-26 DIAGNOSIS — I10 Essential (primary) hypertension: Secondary | ICD-10-CM | POA: Diagnosis not present

## 2024-04-28 DIAGNOSIS — J9601 Acute respiratory failure with hypoxia: Secondary | ICD-10-CM | POA: Diagnosis not present

## 2024-04-28 DIAGNOSIS — R2689 Other abnormalities of gait and mobility: Secondary | ICD-10-CM | POA: Diagnosis not present

## 2024-04-28 DIAGNOSIS — I69811 Memory deficit following other cerebrovascular disease: Secondary | ICD-10-CM | POA: Diagnosis not present

## 2024-04-28 DIAGNOSIS — R293 Abnormal posture: Secondary | ICD-10-CM | POA: Diagnosis not present

## 2024-04-28 DIAGNOSIS — R2681 Unsteadiness on feet: Secondary | ICD-10-CM | POA: Diagnosis not present

## 2024-04-28 DIAGNOSIS — I10 Essential (primary) hypertension: Secondary | ICD-10-CM | POA: Diagnosis not present

## 2024-04-28 DIAGNOSIS — M6259 Muscle wasting and atrophy, not elsewhere classified, multiple sites: Secondary | ICD-10-CM | POA: Diagnosis not present

## 2024-04-28 DIAGNOSIS — K219 Gastro-esophageal reflux disease without esophagitis: Secondary | ICD-10-CM | POA: Diagnosis not present

## 2024-05-01 DIAGNOSIS — E876 Hypokalemia: Secondary | ICD-10-CM | POA: Diagnosis not present

## 2024-05-02 DIAGNOSIS — I10 Essential (primary) hypertension: Secondary | ICD-10-CM | POA: Diagnosis not present

## 2024-05-02 DIAGNOSIS — M6259 Muscle wasting and atrophy, not elsewhere classified, multiple sites: Secondary | ICD-10-CM | POA: Diagnosis not present

## 2024-05-02 DIAGNOSIS — R2681 Unsteadiness on feet: Secondary | ICD-10-CM | POA: Diagnosis not present

## 2024-05-02 DIAGNOSIS — I69811 Memory deficit following other cerebrovascular disease: Secondary | ICD-10-CM | POA: Diagnosis not present

## 2024-05-02 DIAGNOSIS — K219 Gastro-esophageal reflux disease without esophagitis: Secondary | ICD-10-CM | POA: Diagnosis not present

## 2024-05-02 DIAGNOSIS — J9601 Acute respiratory failure with hypoxia: Secondary | ICD-10-CM | POA: Diagnosis not present

## 2024-05-02 DIAGNOSIS — R2689 Other abnormalities of gait and mobility: Secondary | ICD-10-CM | POA: Diagnosis not present

## 2024-05-02 DIAGNOSIS — R293 Abnormal posture: Secondary | ICD-10-CM | POA: Diagnosis not present

## 2024-05-09 DIAGNOSIS — R2689 Other abnormalities of gait and mobility: Secondary | ICD-10-CM | POA: Diagnosis not present

## 2024-05-09 DIAGNOSIS — K219 Gastro-esophageal reflux disease without esophagitis: Secondary | ICD-10-CM | POA: Diagnosis not present

## 2024-05-09 DIAGNOSIS — J9601 Acute respiratory failure with hypoxia: Secondary | ICD-10-CM | POA: Diagnosis not present

## 2024-05-09 DIAGNOSIS — R2681 Unsteadiness on feet: Secondary | ICD-10-CM | POA: Diagnosis not present

## 2024-05-09 DIAGNOSIS — R293 Abnormal posture: Secondary | ICD-10-CM | POA: Diagnosis not present

## 2024-05-09 DIAGNOSIS — I69811 Memory deficit following other cerebrovascular disease: Secondary | ICD-10-CM | POA: Diagnosis not present

## 2024-05-09 DIAGNOSIS — M6259 Muscle wasting and atrophy, not elsewhere classified, multiple sites: Secondary | ICD-10-CM | POA: Diagnosis not present

## 2024-05-09 DIAGNOSIS — I10 Essential (primary) hypertension: Secondary | ICD-10-CM | POA: Diagnosis not present

## 2024-05-10 DIAGNOSIS — R2681 Unsteadiness on feet: Secondary | ICD-10-CM | POA: Diagnosis not present

## 2024-05-10 DIAGNOSIS — I69811 Memory deficit following other cerebrovascular disease: Secondary | ICD-10-CM | POA: Diagnosis not present

## 2024-05-10 DIAGNOSIS — K219 Gastro-esophageal reflux disease without esophagitis: Secondary | ICD-10-CM | POA: Diagnosis not present

## 2024-05-10 DIAGNOSIS — R2689 Other abnormalities of gait and mobility: Secondary | ICD-10-CM | POA: Diagnosis not present

## 2024-05-10 DIAGNOSIS — I10 Essential (primary) hypertension: Secondary | ICD-10-CM | POA: Diagnosis not present

## 2024-05-10 DIAGNOSIS — R293 Abnormal posture: Secondary | ICD-10-CM | POA: Diagnosis not present

## 2024-05-10 DIAGNOSIS — M6259 Muscle wasting and atrophy, not elsewhere classified, multiple sites: Secondary | ICD-10-CM | POA: Diagnosis not present

## 2024-05-10 DIAGNOSIS — J9601 Acute respiratory failure with hypoxia: Secondary | ICD-10-CM | POA: Diagnosis not present

## 2024-05-15 DIAGNOSIS — K219 Gastro-esophageal reflux disease without esophagitis: Secondary | ICD-10-CM | POA: Diagnosis not present

## 2024-05-15 DIAGNOSIS — K5901 Slow transit constipation: Secondary | ICD-10-CM | POA: Diagnosis not present

## 2024-05-15 DIAGNOSIS — R251 Tremor, unspecified: Secondary | ICD-10-CM | POA: Diagnosis not present

## 2024-05-16 DIAGNOSIS — R2681 Unsteadiness on feet: Secondary | ICD-10-CM | POA: Diagnosis not present

## 2024-05-16 DIAGNOSIS — I10 Essential (primary) hypertension: Secondary | ICD-10-CM | POA: Diagnosis not present

## 2024-05-16 DIAGNOSIS — M6259 Muscle wasting and atrophy, not elsewhere classified, multiple sites: Secondary | ICD-10-CM | POA: Diagnosis not present

## 2024-05-16 DIAGNOSIS — I69811 Memory deficit following other cerebrovascular disease: Secondary | ICD-10-CM | POA: Diagnosis not present

## 2024-05-16 DIAGNOSIS — K219 Gastro-esophageal reflux disease without esophagitis: Secondary | ICD-10-CM | POA: Diagnosis not present

## 2024-05-16 DIAGNOSIS — J9601 Acute respiratory failure with hypoxia: Secondary | ICD-10-CM | POA: Diagnosis not present

## 2024-05-16 DIAGNOSIS — R2689 Other abnormalities of gait and mobility: Secondary | ICD-10-CM | POA: Diagnosis not present

## 2024-05-16 DIAGNOSIS — R293 Abnormal posture: Secondary | ICD-10-CM | POA: Diagnosis not present

## 2024-05-17 DIAGNOSIS — R2681 Unsteadiness on feet: Secondary | ICD-10-CM | POA: Diagnosis not present

## 2024-05-17 DIAGNOSIS — I69811 Memory deficit following other cerebrovascular disease: Secondary | ICD-10-CM | POA: Diagnosis not present

## 2024-05-17 DIAGNOSIS — J9601 Acute respiratory failure with hypoxia: Secondary | ICD-10-CM | POA: Diagnosis not present

## 2024-05-17 DIAGNOSIS — M6259 Muscle wasting and atrophy, not elsewhere classified, multiple sites: Secondary | ICD-10-CM | POA: Diagnosis not present

## 2024-05-17 DIAGNOSIS — I10 Essential (primary) hypertension: Secondary | ICD-10-CM | POA: Diagnosis not present

## 2024-05-17 DIAGNOSIS — R293 Abnormal posture: Secondary | ICD-10-CM | POA: Diagnosis not present

## 2024-05-17 DIAGNOSIS — R2689 Other abnormalities of gait and mobility: Secondary | ICD-10-CM | POA: Diagnosis not present

## 2024-05-17 DIAGNOSIS — K219 Gastro-esophageal reflux disease without esophagitis: Secondary | ICD-10-CM | POA: Diagnosis not present

## 2024-05-21 DIAGNOSIS — R9431 Abnormal electrocardiogram [ECG] [EKG]: Secondary | ICD-10-CM | POA: Diagnosis not present

## 2024-05-21 DIAGNOSIS — Z01818 Encounter for other preprocedural examination: Secondary | ICD-10-CM | POA: Diagnosis not present

## 2024-05-21 DIAGNOSIS — Z7401 Bed confinement status: Secondary | ICD-10-CM | POA: Diagnosis not present

## 2024-05-21 DIAGNOSIS — I509 Heart failure, unspecified: Secondary | ICD-10-CM | POA: Diagnosis not present

## 2024-05-21 DIAGNOSIS — S32592A Other specified fracture of left pubis, initial encounter for closed fracture: Secondary | ICD-10-CM | POA: Diagnosis not present

## 2024-05-21 DIAGNOSIS — S32512A Fracture of superior rim of left pubis, initial encounter for closed fracture: Secondary | ICD-10-CM | POA: Diagnosis not present

## 2024-05-21 DIAGNOSIS — I10 Essential (primary) hypertension: Secondary | ICD-10-CM | POA: Diagnosis not present

## 2024-05-21 DIAGNOSIS — R531 Weakness: Secondary | ICD-10-CM | POA: Diagnosis not present

## 2024-05-21 DIAGNOSIS — M25552 Pain in left hip: Secondary | ICD-10-CM | POA: Diagnosis not present

## 2024-05-21 DIAGNOSIS — Z043 Encounter for examination and observation following other accident: Secondary | ICD-10-CM | POA: Diagnosis not present

## 2024-05-21 DIAGNOSIS — W19XXXA Unspecified fall, initial encounter: Secondary | ICD-10-CM | POA: Diagnosis not present

## 2024-05-22 DIAGNOSIS — W19XXXD Unspecified fall, subsequent encounter: Secondary | ICD-10-CM | POA: Diagnosis not present

## 2024-05-22 DIAGNOSIS — S32599D Other specified fracture of unspecified pubis, subsequent encounter for fracture with routine healing: Secondary | ICD-10-CM | POA: Diagnosis not present

## 2024-05-23 DIAGNOSIS — R2681 Unsteadiness on feet: Secondary | ICD-10-CM | POA: Diagnosis not present

## 2024-05-23 DIAGNOSIS — R2689 Other abnormalities of gait and mobility: Secondary | ICD-10-CM | POA: Diagnosis not present

## 2024-05-23 DIAGNOSIS — I69811 Memory deficit following other cerebrovascular disease: Secondary | ICD-10-CM | POA: Diagnosis not present

## 2024-05-23 DIAGNOSIS — J9601 Acute respiratory failure with hypoxia: Secondary | ICD-10-CM | POA: Diagnosis not present

## 2024-05-23 DIAGNOSIS — R293 Abnormal posture: Secondary | ICD-10-CM | POA: Diagnosis not present

## 2024-05-23 DIAGNOSIS — M6259 Muscle wasting and atrophy, not elsewhere classified, multiple sites: Secondary | ICD-10-CM | POA: Diagnosis not present

## 2024-05-23 DIAGNOSIS — I10 Essential (primary) hypertension: Secondary | ICD-10-CM | POA: Diagnosis not present

## 2024-05-23 DIAGNOSIS — K219 Gastro-esophageal reflux disease without esophagitis: Secondary | ICD-10-CM | POA: Diagnosis not present

## 2024-05-26 DIAGNOSIS — I69811 Memory deficit following other cerebrovascular disease: Secondary | ICD-10-CM | POA: Diagnosis not present

## 2024-05-26 DIAGNOSIS — J9601 Acute respiratory failure with hypoxia: Secondary | ICD-10-CM | POA: Diagnosis not present

## 2024-05-26 DIAGNOSIS — I10 Essential (primary) hypertension: Secondary | ICD-10-CM | POA: Diagnosis not present

## 2024-05-26 DIAGNOSIS — R293 Abnormal posture: Secondary | ICD-10-CM | POA: Diagnosis not present

## 2024-05-26 DIAGNOSIS — R2689 Other abnormalities of gait and mobility: Secondary | ICD-10-CM | POA: Diagnosis not present

## 2024-05-26 DIAGNOSIS — R2681 Unsteadiness on feet: Secondary | ICD-10-CM | POA: Diagnosis not present

## 2024-05-26 DIAGNOSIS — K219 Gastro-esophageal reflux disease without esophagitis: Secondary | ICD-10-CM | POA: Diagnosis not present

## 2024-05-28 DIAGNOSIS — I69811 Memory deficit following other cerebrovascular disease: Secondary | ICD-10-CM | POA: Diagnosis not present

## 2024-05-28 DIAGNOSIS — K219 Gastro-esophageal reflux disease without esophagitis: Secondary | ICD-10-CM | POA: Diagnosis not present

## 2024-05-28 DIAGNOSIS — J9601 Acute respiratory failure with hypoxia: Secondary | ICD-10-CM | POA: Diagnosis not present

## 2024-05-28 DIAGNOSIS — R2689 Other abnormalities of gait and mobility: Secondary | ICD-10-CM | POA: Diagnosis not present

## 2024-05-28 DIAGNOSIS — R293 Abnormal posture: Secondary | ICD-10-CM | POA: Diagnosis not present

## 2024-05-28 DIAGNOSIS — R2681 Unsteadiness on feet: Secondary | ICD-10-CM | POA: Diagnosis not present

## 2024-05-28 DIAGNOSIS — I10 Essential (primary) hypertension: Secondary | ICD-10-CM | POA: Diagnosis not present

## 2024-05-29 DIAGNOSIS — S32599D Other specified fracture of unspecified pubis, subsequent encounter for fracture with routine healing: Secondary | ICD-10-CM | POA: Diagnosis not present

## 2024-05-29 DIAGNOSIS — R293 Abnormal posture: Secondary | ICD-10-CM | POA: Diagnosis not present

## 2024-05-29 DIAGNOSIS — R52 Pain, unspecified: Secondary | ICD-10-CM | POA: Diagnosis not present

## 2024-05-29 DIAGNOSIS — R2689 Other abnormalities of gait and mobility: Secondary | ICD-10-CM | POA: Diagnosis not present

## 2024-05-29 DIAGNOSIS — R2681 Unsteadiness on feet: Secondary | ICD-10-CM | POA: Diagnosis not present

## 2024-05-29 DIAGNOSIS — J9601 Acute respiratory failure with hypoxia: Secondary | ICD-10-CM | POA: Diagnosis not present

## 2024-05-29 DIAGNOSIS — I10 Essential (primary) hypertension: Secondary | ICD-10-CM | POA: Diagnosis not present

## 2024-05-29 DIAGNOSIS — K219 Gastro-esophageal reflux disease without esophagitis: Secondary | ICD-10-CM | POA: Diagnosis not present

## 2024-05-29 DIAGNOSIS — I69811 Memory deficit following other cerebrovascular disease: Secondary | ICD-10-CM | POA: Diagnosis not present

## 2024-05-30 DIAGNOSIS — M6259 Muscle wasting and atrophy, not elsewhere classified, multiple sites: Secondary | ICD-10-CM | POA: Diagnosis not present

## 2024-05-30 DIAGNOSIS — R2681 Unsteadiness on feet: Secondary | ICD-10-CM | POA: Diagnosis not present

## 2024-05-30 DIAGNOSIS — I10 Essential (primary) hypertension: Secondary | ICD-10-CM | POA: Diagnosis not present

## 2024-05-30 DIAGNOSIS — I69811 Memory deficit following other cerebrovascular disease: Secondary | ICD-10-CM | POA: Diagnosis not present

## 2024-05-30 DIAGNOSIS — R2689 Other abnormalities of gait and mobility: Secondary | ICD-10-CM | POA: Diagnosis not present

## 2024-05-30 DIAGNOSIS — R293 Abnormal posture: Secondary | ICD-10-CM | POA: Diagnosis not present

## 2024-05-30 DIAGNOSIS — J9601 Acute respiratory failure with hypoxia: Secondary | ICD-10-CM | POA: Diagnosis not present

## 2024-05-30 DIAGNOSIS — K219 Gastro-esophageal reflux disease without esophagitis: Secondary | ICD-10-CM | POA: Diagnosis not present

## 2024-05-31 DIAGNOSIS — J9601 Acute respiratory failure with hypoxia: Secondary | ICD-10-CM | POA: Diagnosis not present

## 2024-05-31 DIAGNOSIS — K219 Gastro-esophageal reflux disease without esophagitis: Secondary | ICD-10-CM | POA: Diagnosis not present

## 2024-05-31 DIAGNOSIS — I10 Essential (primary) hypertension: Secondary | ICD-10-CM | POA: Diagnosis not present

## 2024-05-31 DIAGNOSIS — R293 Abnormal posture: Secondary | ICD-10-CM | POA: Diagnosis not present

## 2024-05-31 DIAGNOSIS — R2689 Other abnormalities of gait and mobility: Secondary | ICD-10-CM | POA: Diagnosis not present

## 2024-05-31 DIAGNOSIS — R2681 Unsteadiness on feet: Secondary | ICD-10-CM | POA: Diagnosis not present

## 2024-05-31 DIAGNOSIS — I69811 Memory deficit following other cerebrovascular disease: Secondary | ICD-10-CM | POA: Diagnosis not present

## 2024-06-02 DIAGNOSIS — I69811 Memory deficit following other cerebrovascular disease: Secondary | ICD-10-CM | POA: Diagnosis not present

## 2024-06-02 DIAGNOSIS — K219 Gastro-esophageal reflux disease without esophagitis: Secondary | ICD-10-CM | POA: Diagnosis not present

## 2024-06-02 DIAGNOSIS — R2689 Other abnormalities of gait and mobility: Secondary | ICD-10-CM | POA: Diagnosis not present

## 2024-06-02 DIAGNOSIS — J9601 Acute respiratory failure with hypoxia: Secondary | ICD-10-CM | POA: Diagnosis not present

## 2024-06-02 DIAGNOSIS — M6259 Muscle wasting and atrophy, not elsewhere classified, multiple sites: Secondary | ICD-10-CM | POA: Diagnosis not present

## 2024-06-02 DIAGNOSIS — S32592A Other specified fracture of left pubis, initial encounter for closed fracture: Secondary | ICD-10-CM | POA: Diagnosis not present

## 2024-06-02 DIAGNOSIS — I10 Essential (primary) hypertension: Secondary | ICD-10-CM | POA: Diagnosis not present

## 2024-06-02 DIAGNOSIS — R2681 Unsteadiness on feet: Secondary | ICD-10-CM | POA: Diagnosis not present

## 2024-06-02 DIAGNOSIS — R293 Abnormal posture: Secondary | ICD-10-CM | POA: Diagnosis not present

## 2024-06-03 DIAGNOSIS — I69811 Memory deficit following other cerebrovascular disease: Secondary | ICD-10-CM | POA: Diagnosis not present

## 2024-06-03 DIAGNOSIS — J9601 Acute respiratory failure with hypoxia: Secondary | ICD-10-CM | POA: Diagnosis not present

## 2024-06-03 DIAGNOSIS — I10 Essential (primary) hypertension: Secondary | ICD-10-CM | POA: Diagnosis not present

## 2024-06-03 DIAGNOSIS — K219 Gastro-esophageal reflux disease without esophagitis: Secondary | ICD-10-CM | POA: Diagnosis not present

## 2024-06-03 DIAGNOSIS — R2681 Unsteadiness on feet: Secondary | ICD-10-CM | POA: Diagnosis not present

## 2024-06-03 DIAGNOSIS — R293 Abnormal posture: Secondary | ICD-10-CM | POA: Diagnosis not present

## 2024-06-03 DIAGNOSIS — M6259 Muscle wasting and atrophy, not elsewhere classified, multiple sites: Secondary | ICD-10-CM | POA: Diagnosis not present

## 2024-06-03 DIAGNOSIS — R2689 Other abnormalities of gait and mobility: Secondary | ICD-10-CM | POA: Diagnosis not present

## 2024-06-05 DIAGNOSIS — R2681 Unsteadiness on feet: Secondary | ICD-10-CM | POA: Diagnosis not present

## 2024-06-05 DIAGNOSIS — J9601 Acute respiratory failure with hypoxia: Secondary | ICD-10-CM | POA: Diagnosis not present

## 2024-06-05 DIAGNOSIS — I69811 Memory deficit following other cerebrovascular disease: Secondary | ICD-10-CM | POA: Diagnosis not present

## 2024-06-05 DIAGNOSIS — K219 Gastro-esophageal reflux disease without esophagitis: Secondary | ICD-10-CM | POA: Diagnosis not present

## 2024-06-05 DIAGNOSIS — I10 Essential (primary) hypertension: Secondary | ICD-10-CM | POA: Diagnosis not present

## 2024-06-05 DIAGNOSIS — R293 Abnormal posture: Secondary | ICD-10-CM | POA: Diagnosis not present

## 2024-06-05 DIAGNOSIS — R2689 Other abnormalities of gait and mobility: Secondary | ICD-10-CM | POA: Diagnosis not present

## 2024-06-08 DIAGNOSIS — S32599D Other specified fracture of unspecified pubis, subsequent encounter for fracture with routine healing: Secondary | ICD-10-CM | POA: Diagnosis not present

## 2024-06-08 DIAGNOSIS — E876 Hypokalemia: Secondary | ICD-10-CM | POA: Diagnosis not present

## 2024-06-08 DIAGNOSIS — R400 Somnolence: Secondary | ICD-10-CM | POA: Diagnosis not present

## 2024-06-08 DIAGNOSIS — R251 Tremor, unspecified: Secondary | ICD-10-CM | POA: Diagnosis not present

## 2024-06-08 DIAGNOSIS — I10 Essential (primary) hypertension: Secondary | ICD-10-CM | POA: Diagnosis not present

## 2024-06-08 DIAGNOSIS — K219 Gastro-esophageal reflux disease without esophagitis: Secondary | ICD-10-CM | POA: Diagnosis not present

## 2024-06-08 DIAGNOSIS — I679 Cerebrovascular disease, unspecified: Secondary | ICD-10-CM | POA: Diagnosis not present

## 2024-06-08 DIAGNOSIS — R52 Pain, unspecified: Secondary | ICD-10-CM | POA: Diagnosis not present

## 2024-06-08 DIAGNOSIS — K5901 Slow transit constipation: Secondary | ICD-10-CM | POA: Diagnosis not present

## 2024-06-08 DIAGNOSIS — E785 Hyperlipidemia, unspecified: Secondary | ICD-10-CM | POA: Diagnosis not present

## 2024-06-10 DIAGNOSIS — S32512D Fracture of superior rim of left pubis, subsequent encounter for fracture with routine healing: Secondary | ICD-10-CM | POA: Diagnosis not present

## 2024-06-10 DIAGNOSIS — S0003XA Contusion of scalp, initial encounter: Secondary | ICD-10-CM | POA: Diagnosis not present

## 2024-06-10 DIAGNOSIS — S32592D Other specified fracture of left pubis, subsequent encounter for fracture with routine healing: Secondary | ICD-10-CM | POA: Diagnosis not present

## 2024-06-10 DIAGNOSIS — Z043 Encounter for examination and observation following other accident: Secondary | ICD-10-CM | POA: Diagnosis not present

## 2024-06-10 DIAGNOSIS — T148XXA Other injury of unspecified body region, initial encounter: Secondary | ICD-10-CM | POA: Diagnosis not present

## 2024-06-10 DIAGNOSIS — Z743 Need for continuous supervision: Secondary | ICD-10-CM | POA: Diagnosis not present

## 2024-06-10 DIAGNOSIS — Z8781 Personal history of (healed) traumatic fracture: Secondary | ICD-10-CM | POA: Diagnosis not present

## 2024-06-10 DIAGNOSIS — W19XXXA Unspecified fall, initial encounter: Secondary | ICD-10-CM | POA: Diagnosis not present

## 2024-06-10 DIAGNOSIS — K449 Diaphragmatic hernia without obstruction or gangrene: Secondary | ICD-10-CM | POA: Diagnosis not present

## 2024-06-13 DIAGNOSIS — I69811 Memory deficit following other cerebrovascular disease: Secondary | ICD-10-CM | POA: Diagnosis not present

## 2024-06-14 DIAGNOSIS — W19XXXD Unspecified fall, subsequent encounter: Secondary | ICD-10-CM | POA: Diagnosis not present

## 2024-06-14 DIAGNOSIS — K219 Gastro-esophageal reflux disease without esophagitis: Secondary | ICD-10-CM | POA: Diagnosis not present

## 2024-06-14 DIAGNOSIS — R52 Pain, unspecified: Secondary | ICD-10-CM | POA: Diagnosis not present

## 2024-06-14 DIAGNOSIS — R293 Abnormal posture: Secondary | ICD-10-CM | POA: Diagnosis not present

## 2024-06-14 DIAGNOSIS — J9601 Acute respiratory failure with hypoxia: Secondary | ICD-10-CM | POA: Diagnosis not present

## 2024-06-14 DIAGNOSIS — I69811 Memory deficit following other cerebrovascular disease: Secondary | ICD-10-CM | POA: Diagnosis not present

## 2024-06-14 DIAGNOSIS — R2689 Other abnormalities of gait and mobility: Secondary | ICD-10-CM | POA: Diagnosis not present

## 2024-06-14 DIAGNOSIS — S32599D Other specified fracture of unspecified pubis, subsequent encounter for fracture with routine healing: Secondary | ICD-10-CM | POA: Diagnosis not present

## 2024-06-14 DIAGNOSIS — R2681 Unsteadiness on feet: Secondary | ICD-10-CM | POA: Diagnosis not present

## 2024-06-19 DIAGNOSIS — R2689 Other abnormalities of gait and mobility: Secondary | ICD-10-CM | POA: Diagnosis not present

## 2024-06-19 DIAGNOSIS — J9601 Acute respiratory failure with hypoxia: Secondary | ICD-10-CM | POA: Diagnosis not present

## 2024-06-19 DIAGNOSIS — R293 Abnormal posture: Secondary | ICD-10-CM | POA: Diagnosis not present

## 2024-06-19 DIAGNOSIS — I69811 Memory deficit following other cerebrovascular disease: Secondary | ICD-10-CM | POA: Diagnosis not present

## 2024-06-19 DIAGNOSIS — I10 Essential (primary) hypertension: Secondary | ICD-10-CM | POA: Diagnosis not present

## 2024-06-19 DIAGNOSIS — K219 Gastro-esophageal reflux disease without esophagitis: Secondary | ICD-10-CM | POA: Diagnosis not present

## 2024-06-19 DIAGNOSIS — R2681 Unsteadiness on feet: Secondary | ICD-10-CM | POA: Diagnosis not present

## 2024-06-20 DIAGNOSIS — R2689 Other abnormalities of gait and mobility: Secondary | ICD-10-CM | POA: Diagnosis not present

## 2024-06-20 DIAGNOSIS — I69811 Memory deficit following other cerebrovascular disease: Secondary | ICD-10-CM | POA: Diagnosis not present

## 2024-06-20 DIAGNOSIS — J9601 Acute respiratory failure with hypoxia: Secondary | ICD-10-CM | POA: Diagnosis not present

## 2024-06-20 DIAGNOSIS — R293 Abnormal posture: Secondary | ICD-10-CM | POA: Diagnosis not present

## 2024-06-20 DIAGNOSIS — K219 Gastro-esophageal reflux disease without esophagitis: Secondary | ICD-10-CM | POA: Diagnosis not present

## 2024-06-20 DIAGNOSIS — I10 Essential (primary) hypertension: Secondary | ICD-10-CM | POA: Diagnosis not present

## 2024-06-20 DIAGNOSIS — R2681 Unsteadiness on feet: Secondary | ICD-10-CM | POA: Diagnosis not present

## 2024-06-22 DIAGNOSIS — J9601 Acute respiratory failure with hypoxia: Secondary | ICD-10-CM | POA: Diagnosis not present

## 2024-06-22 DIAGNOSIS — I10 Essential (primary) hypertension: Secondary | ICD-10-CM | POA: Diagnosis not present

## 2024-06-22 DIAGNOSIS — R293 Abnormal posture: Secondary | ICD-10-CM | POA: Diagnosis not present

## 2024-06-22 DIAGNOSIS — R2681 Unsteadiness on feet: Secondary | ICD-10-CM | POA: Diagnosis not present

## 2024-06-22 DIAGNOSIS — R2689 Other abnormalities of gait and mobility: Secondary | ICD-10-CM | POA: Diagnosis not present

## 2024-06-22 DIAGNOSIS — K219 Gastro-esophageal reflux disease without esophagitis: Secondary | ICD-10-CM | POA: Diagnosis not present

## 2024-06-22 DIAGNOSIS — I69811 Memory deficit following other cerebrovascular disease: Secondary | ICD-10-CM | POA: Diagnosis not present

## 2024-06-23 DIAGNOSIS — I10 Essential (primary) hypertension: Secondary | ICD-10-CM | POA: Diagnosis not present

## 2024-06-23 DIAGNOSIS — M6259 Muscle wasting and atrophy, not elsewhere classified, multiple sites: Secondary | ICD-10-CM | POA: Diagnosis not present

## 2024-06-23 DIAGNOSIS — I69811 Memory deficit following other cerebrovascular disease: Secondary | ICD-10-CM | POA: Diagnosis not present

## 2024-06-23 DIAGNOSIS — R2689 Other abnormalities of gait and mobility: Secondary | ICD-10-CM | POA: Diagnosis not present

## 2024-06-23 DIAGNOSIS — R293 Abnormal posture: Secondary | ICD-10-CM | POA: Diagnosis not present

## 2024-06-23 DIAGNOSIS — R2681 Unsteadiness on feet: Secondary | ICD-10-CM | POA: Diagnosis not present

## 2024-06-23 DIAGNOSIS — J9601 Acute respiratory failure with hypoxia: Secondary | ICD-10-CM | POA: Diagnosis not present

## 2024-06-23 DIAGNOSIS — K219 Gastro-esophageal reflux disease without esophagitis: Secondary | ICD-10-CM | POA: Diagnosis not present

## 2024-06-26 DIAGNOSIS — J9601 Acute respiratory failure with hypoxia: Secondary | ICD-10-CM | POA: Diagnosis not present

## 2024-06-26 DIAGNOSIS — R2681 Unsteadiness on feet: Secondary | ICD-10-CM | POA: Diagnosis not present

## 2024-06-26 DIAGNOSIS — K219 Gastro-esophageal reflux disease without esophagitis: Secondary | ICD-10-CM | POA: Diagnosis not present

## 2024-06-26 DIAGNOSIS — R2689 Other abnormalities of gait and mobility: Secondary | ICD-10-CM | POA: Diagnosis not present

## 2024-06-26 DIAGNOSIS — I10 Essential (primary) hypertension: Secondary | ICD-10-CM | POA: Diagnosis not present

## 2024-06-26 DIAGNOSIS — I69811 Memory deficit following other cerebrovascular disease: Secondary | ICD-10-CM | POA: Diagnosis not present

## 2024-06-26 DIAGNOSIS — R293 Abnormal posture: Secondary | ICD-10-CM | POA: Diagnosis not present

## 2024-06-26 DIAGNOSIS — M6259 Muscle wasting and atrophy, not elsewhere classified, multiple sites: Secondary | ICD-10-CM | POA: Diagnosis not present

## 2024-06-27 DIAGNOSIS — R2681 Unsteadiness on feet: Secondary | ICD-10-CM | POA: Diagnosis not present

## 2024-06-27 DIAGNOSIS — I10 Essential (primary) hypertension: Secondary | ICD-10-CM | POA: Diagnosis not present

## 2024-06-27 DIAGNOSIS — I69811 Memory deficit following other cerebrovascular disease: Secondary | ICD-10-CM | POA: Diagnosis not present

## 2024-06-27 DIAGNOSIS — J9601 Acute respiratory failure with hypoxia: Secondary | ICD-10-CM | POA: Diagnosis not present

## 2024-06-27 DIAGNOSIS — R293 Abnormal posture: Secondary | ICD-10-CM | POA: Diagnosis not present

## 2024-06-27 DIAGNOSIS — M6259 Muscle wasting and atrophy, not elsewhere classified, multiple sites: Secondary | ICD-10-CM | POA: Diagnosis not present

## 2024-06-27 DIAGNOSIS — R2689 Other abnormalities of gait and mobility: Secondary | ICD-10-CM | POA: Diagnosis not present

## 2024-06-27 DIAGNOSIS — K219 Gastro-esophageal reflux disease without esophagitis: Secondary | ICD-10-CM | POA: Diagnosis not present

## 2024-06-29 DIAGNOSIS — K219 Gastro-esophageal reflux disease without esophagitis: Secondary | ICD-10-CM | POA: Diagnosis not present

## 2024-06-29 DIAGNOSIS — I10 Essential (primary) hypertension: Secondary | ICD-10-CM | POA: Diagnosis not present

## 2024-06-29 DIAGNOSIS — R2681 Unsteadiness on feet: Secondary | ICD-10-CM | POA: Diagnosis not present

## 2024-06-29 DIAGNOSIS — I69811 Memory deficit following other cerebrovascular disease: Secondary | ICD-10-CM | POA: Diagnosis not present

## 2024-06-29 DIAGNOSIS — R293 Abnormal posture: Secondary | ICD-10-CM | POA: Diagnosis not present

## 2024-06-29 DIAGNOSIS — M6259 Muscle wasting and atrophy, not elsewhere classified, multiple sites: Secondary | ICD-10-CM | POA: Diagnosis not present

## 2024-06-29 DIAGNOSIS — J9601 Acute respiratory failure with hypoxia: Secondary | ICD-10-CM | POA: Diagnosis not present

## 2024-06-29 DIAGNOSIS — R2689 Other abnormalities of gait and mobility: Secondary | ICD-10-CM | POA: Diagnosis not present

## 2024-07-03 DIAGNOSIS — J9601 Acute respiratory failure with hypoxia: Secondary | ICD-10-CM | POA: Diagnosis not present

## 2024-07-03 DIAGNOSIS — E785 Hyperlipidemia, unspecified: Secondary | ICD-10-CM | POA: Diagnosis not present

## 2024-07-03 DIAGNOSIS — M6259 Muscle wasting and atrophy, not elsewhere classified, multiple sites: Secondary | ICD-10-CM | POA: Diagnosis not present

## 2024-07-03 DIAGNOSIS — R52 Pain, unspecified: Secondary | ICD-10-CM | POA: Diagnosis not present

## 2024-07-03 DIAGNOSIS — R251 Tremor, unspecified: Secondary | ICD-10-CM | POA: Diagnosis not present

## 2024-07-03 DIAGNOSIS — R2681 Unsteadiness on feet: Secondary | ICD-10-CM | POA: Diagnosis not present

## 2024-07-03 DIAGNOSIS — R293 Abnormal posture: Secondary | ICD-10-CM | POA: Diagnosis not present

## 2024-07-03 DIAGNOSIS — S32599D Other specified fracture of unspecified pubis, subsequent encounter for fracture with routine healing: Secondary | ICD-10-CM | POA: Diagnosis not present

## 2024-07-03 DIAGNOSIS — E039 Hypothyroidism, unspecified: Secondary | ICD-10-CM | POA: Diagnosis not present

## 2024-07-03 DIAGNOSIS — I10 Essential (primary) hypertension: Secondary | ICD-10-CM | POA: Diagnosis not present

## 2024-07-03 DIAGNOSIS — K219 Gastro-esophageal reflux disease without esophagitis: Secondary | ICD-10-CM | POA: Diagnosis not present

## 2024-07-03 DIAGNOSIS — R2689 Other abnormalities of gait and mobility: Secondary | ICD-10-CM | POA: Diagnosis not present

## 2024-07-03 DIAGNOSIS — I679 Cerebrovascular disease, unspecified: Secondary | ICD-10-CM | POA: Diagnosis not present

## 2024-07-03 DIAGNOSIS — I69811 Memory deficit following other cerebrovascular disease: Secondary | ICD-10-CM | POA: Diagnosis not present

## 2024-07-03 DIAGNOSIS — K5901 Slow transit constipation: Secondary | ICD-10-CM | POA: Diagnosis not present

## 2024-07-04 DIAGNOSIS — I69811 Memory deficit following other cerebrovascular disease: Secondary | ICD-10-CM | POA: Diagnosis not present

## 2024-07-04 DIAGNOSIS — J9601 Acute respiratory failure with hypoxia: Secondary | ICD-10-CM | POA: Diagnosis not present

## 2024-07-04 DIAGNOSIS — K219 Gastro-esophageal reflux disease without esophagitis: Secondary | ICD-10-CM | POA: Diagnosis not present

## 2024-07-04 DIAGNOSIS — R2689 Other abnormalities of gait and mobility: Secondary | ICD-10-CM | POA: Diagnosis not present

## 2024-07-04 DIAGNOSIS — I10 Essential (primary) hypertension: Secondary | ICD-10-CM | POA: Diagnosis not present

## 2024-07-04 DIAGNOSIS — M6259 Muscle wasting and atrophy, not elsewhere classified, multiple sites: Secondary | ICD-10-CM | POA: Diagnosis not present

## 2024-07-04 DIAGNOSIS — R293 Abnormal posture: Secondary | ICD-10-CM | POA: Diagnosis not present

## 2024-07-04 DIAGNOSIS — R2681 Unsteadiness on feet: Secondary | ICD-10-CM | POA: Diagnosis not present

## 2024-07-05 DIAGNOSIS — R293 Abnormal posture: Secondary | ICD-10-CM | POA: Diagnosis not present

## 2024-07-05 DIAGNOSIS — R2689 Other abnormalities of gait and mobility: Secondary | ICD-10-CM | POA: Diagnosis not present

## 2024-07-05 DIAGNOSIS — M6259 Muscle wasting and atrophy, not elsewhere classified, multiple sites: Secondary | ICD-10-CM | POA: Diagnosis not present

## 2024-07-05 DIAGNOSIS — I69811 Memory deficit following other cerebrovascular disease: Secondary | ICD-10-CM | POA: Diagnosis not present

## 2024-07-05 DIAGNOSIS — J9601 Acute respiratory failure with hypoxia: Secondary | ICD-10-CM | POA: Diagnosis not present

## 2024-07-05 DIAGNOSIS — K219 Gastro-esophageal reflux disease without esophagitis: Secondary | ICD-10-CM | POA: Diagnosis not present

## 2024-07-05 DIAGNOSIS — I10 Essential (primary) hypertension: Secondary | ICD-10-CM | POA: Diagnosis not present

## 2024-07-05 DIAGNOSIS — R2681 Unsteadiness on feet: Secondary | ICD-10-CM | POA: Diagnosis not present

## 2024-07-11 DIAGNOSIS — I69811 Memory deficit following other cerebrovascular disease: Secondary | ICD-10-CM | POA: Diagnosis not present

## 2024-07-12 DIAGNOSIS — S32592A Other specified fracture of left pubis, initial encounter for closed fracture: Secondary | ICD-10-CM | POA: Diagnosis not present

## 2024-07-19 DIAGNOSIS — E039 Hypothyroidism, unspecified: Secondary | ICD-10-CM | POA: Diagnosis not present

## 2024-07-19 DIAGNOSIS — I10 Essential (primary) hypertension: Secondary | ICD-10-CM | POA: Diagnosis not present

## 2024-07-19 DIAGNOSIS — R52 Pain, unspecified: Secondary | ICD-10-CM | POA: Diagnosis not present

## 2024-07-19 DIAGNOSIS — R251 Tremor, unspecified: Secondary | ICD-10-CM | POA: Diagnosis not present

## 2024-07-19 DIAGNOSIS — S32599D Other specified fracture of unspecified pubis, subsequent encounter for fracture with routine healing: Secondary | ICD-10-CM | POA: Diagnosis not present

## 2024-07-19 DIAGNOSIS — K5901 Slow transit constipation: Secondary | ICD-10-CM | POA: Diagnosis not present

## 2024-07-19 DIAGNOSIS — E785 Hyperlipidemia, unspecified: Secondary | ICD-10-CM | POA: Diagnosis not present

## 2024-07-19 DIAGNOSIS — K219 Gastro-esophageal reflux disease without esophagitis: Secondary | ICD-10-CM | POA: Diagnosis not present

## 2024-07-19 DIAGNOSIS — I679 Cerebrovascular disease, unspecified: Secondary | ICD-10-CM | POA: Diagnosis not present

## 2024-07-24 DIAGNOSIS — R451 Restlessness and agitation: Secondary | ICD-10-CM | POA: Diagnosis not present

## 2024-07-25 DIAGNOSIS — I69811 Memory deficit following other cerebrovascular disease: Secondary | ICD-10-CM | POA: Diagnosis not present

## 2024-07-27 DIAGNOSIS — Z13220 Encounter for screening for lipoid disorders: Secondary | ICD-10-CM | POA: Diagnosis not present

## 2024-07-27 DIAGNOSIS — I10 Essential (primary) hypertension: Secondary | ICD-10-CM | POA: Diagnosis not present

## 2024-07-27 DIAGNOSIS — Z130 Encounter for screening for diseases of the blood and blood-forming organs and certain disorders involving the immune mechanism: Secondary | ICD-10-CM | POA: Diagnosis not present

## 2024-07-31 DIAGNOSIS — R451 Restlessness and agitation: Secondary | ICD-10-CM | POA: Diagnosis not present

## 2024-08-02 DIAGNOSIS — R2241 Localized swelling, mass and lump, right lower limb: Secondary | ICD-10-CM | POA: Diagnosis not present

## 2024-08-08 DIAGNOSIS — I69811 Memory deficit following other cerebrovascular disease: Secondary | ICD-10-CM | POA: Diagnosis not present

## 2024-08-09 DIAGNOSIS — L602 Onychogryphosis: Secondary | ICD-10-CM | POA: Diagnosis not present

## 2024-08-09 DIAGNOSIS — I739 Peripheral vascular disease, unspecified: Secondary | ICD-10-CM | POA: Diagnosis not present

## 2024-08-09 DIAGNOSIS — E790 Hyperuricemia without signs of inflammatory arthritis and tophaceous disease: Secondary | ICD-10-CM | POA: Diagnosis not present

## 2024-08-09 DIAGNOSIS — I890 Lymphedema, not elsewhere classified: Secondary | ICD-10-CM | POA: Diagnosis not present

## 2024-08-09 DIAGNOSIS — L603 Nail dystrophy: Secondary | ICD-10-CM | POA: Diagnosis not present

## 2024-08-09 DIAGNOSIS — R7989 Other specified abnormal findings of blood chemistry: Secondary | ICD-10-CM | POA: Diagnosis not present

## 2024-08-10 DIAGNOSIS — S0083XA Contusion of other part of head, initial encounter: Secondary | ICD-10-CM | POA: Diagnosis not present

## 2024-08-10 DIAGNOSIS — S0993XA Unspecified injury of face, initial encounter: Secondary | ICD-10-CM | POA: Diagnosis not present

## 2024-08-21 DIAGNOSIS — E785 Hyperlipidemia, unspecified: Secondary | ICD-10-CM | POA: Diagnosis not present

## 2024-08-21 DIAGNOSIS — R52 Pain, unspecified: Secondary | ICD-10-CM | POA: Diagnosis not present

## 2024-08-21 DIAGNOSIS — I679 Cerebrovascular disease, unspecified: Secondary | ICD-10-CM | POA: Diagnosis not present

## 2024-08-21 DIAGNOSIS — I10 Essential (primary) hypertension: Secondary | ICD-10-CM | POA: Diagnosis not present

## 2024-08-21 DIAGNOSIS — E039 Hypothyroidism, unspecified: Secondary | ICD-10-CM | POA: Diagnosis not present

## 2024-08-21 DIAGNOSIS — K219 Gastro-esophageal reflux disease without esophagitis: Secondary | ICD-10-CM | POA: Diagnosis not present

## 2024-08-21 DIAGNOSIS — R451 Restlessness and agitation: Secondary | ICD-10-CM | POA: Diagnosis not present

## 2024-08-22 DIAGNOSIS — I69811 Memory deficit following other cerebrovascular disease: Secondary | ICD-10-CM | POA: Diagnosis not present

## 2024-10-26 DEATH — deceased
